# Patient Record
Sex: Male | Born: 1950 | Race: White | Hispanic: No | Marital: Married | Smoking: Former smoker
Health system: Southern US, Community
[De-identification: ages and names within clinical notes are randomized; demographics above are authoritative.]

## PROBLEM LIST (undated history)

## (undated) ENCOUNTER — Emergency Department (HOSPITAL_COMMUNITY): Admission: EM | Payer: Medicare Other | Source: Home / Self Care

## (undated) DIAGNOSIS — M5430 Sciatica, unspecified side: Secondary | ICD-10-CM

## (undated) DIAGNOSIS — I10 Essential (primary) hypertension: Secondary | ICD-10-CM

## (undated) DIAGNOSIS — I4891 Unspecified atrial fibrillation: Secondary | ICD-10-CM

## (undated) DIAGNOSIS — R55 Syncope and collapse: Secondary | ICD-10-CM

## (undated) DIAGNOSIS — I517 Cardiomegaly: Secondary | ICD-10-CM

## (undated) DIAGNOSIS — F1010 Alcohol abuse, uncomplicated: Secondary | ICD-10-CM

## (undated) DIAGNOSIS — I2510 Atherosclerotic heart disease of native coronary artery without angina pectoris: Secondary | ICD-10-CM

## (undated) DIAGNOSIS — I428 Other cardiomyopathies: Secondary | ICD-10-CM

## (undated) DIAGNOSIS — I340 Nonrheumatic mitral (valve) insufficiency: Secondary | ICD-10-CM

## (undated) DIAGNOSIS — I482 Chronic atrial fibrillation, unspecified: Secondary | ICD-10-CM

## (undated) DIAGNOSIS — Z7901 Long term (current) use of anticoagulants: Secondary | ICD-10-CM

## (undated) DIAGNOSIS — J189 Pneumonia, unspecified organism: Secondary | ICD-10-CM

## (undated) DIAGNOSIS — I420 Dilated cardiomyopathy: Secondary | ICD-10-CM

## (undated) DIAGNOSIS — I219 Acute myocardial infarction, unspecified: Secondary | ICD-10-CM

## (undated) DIAGNOSIS — I509 Heart failure, unspecified: Secondary | ICD-10-CM

## (undated) HISTORY — DX: Syncope and collapse: R55

## (undated) HISTORY — DX: Cardiomegaly: I51.7

## (undated) HISTORY — PX: STOMACH SURGERY: SHX791

## (undated) HISTORY — DX: Alcohol abuse, uncomplicated: F10.10

## (undated) HISTORY — DX: Other cardiomyopathies: I42.8

## (undated) HISTORY — DX: Long term (current) use of anticoagulants: Z79.01

## (undated) HISTORY — DX: Unspecified atrial fibrillation: I48.91

## (undated) HISTORY — DX: Chronic atrial fibrillation, unspecified: I48.20

## (undated) HISTORY — DX: Heart failure, unspecified: I50.9

## (undated) HISTORY — DX: Atherosclerotic heart disease of native coronary artery without angina pectoris: I25.10

## (undated) HISTORY — DX: Dilated cardiomyopathy: I42.0

## (undated) HISTORY — DX: Nonrheumatic mitral (valve) insufficiency: I34.0

## (undated) HISTORY — DX: Sciatica, unspecified side: M54.30

---

## 1997-08-29 ENCOUNTER — Inpatient Hospital Stay (HOSPITAL_COMMUNITY): Admission: AD | Admit: 1997-08-29 | Discharge: 1997-09-03 | Payer: Self-pay | Admitting: Internal Medicine

## 2003-02-18 DIAGNOSIS — R55 Syncope and collapse: Secondary | ICD-10-CM

## 2003-02-18 HISTORY — DX: Syncope and collapse: R55

## 2003-02-26 ENCOUNTER — Inpatient Hospital Stay (HOSPITAL_COMMUNITY): Admission: EM | Admit: 2003-02-26 | Discharge: 2003-03-03 | Payer: Self-pay | Admitting: Emergency Medicine

## 2003-02-27 ENCOUNTER — Encounter: Payer: Self-pay | Admitting: Cardiology

## 2003-02-28 HISTORY — PX: CARDIAC CATHETERIZATION: SHX172

## 2004-01-04 ENCOUNTER — Ambulatory Visit: Payer: Self-pay

## 2004-01-25 ENCOUNTER — Ambulatory Visit: Payer: Self-pay | Admitting: Cardiovascular Disease

## 2004-02-08 ENCOUNTER — Ambulatory Visit: Payer: Self-pay | Admitting: Cardiology

## 2004-02-22 ENCOUNTER — Ambulatory Visit: Payer: Self-pay | Admitting: Internal Medicine

## 2004-03-07 ENCOUNTER — Ambulatory Visit: Payer: Self-pay | Admitting: Cardiology

## 2004-04-04 ENCOUNTER — Ambulatory Visit: Payer: Self-pay | Admitting: *Deleted

## 2004-04-18 ENCOUNTER — Ambulatory Visit: Payer: Self-pay | Admitting: Internal Medicine

## 2004-05-16 ENCOUNTER — Ambulatory Visit: Payer: Self-pay | Admitting: Internal Medicine

## 2004-05-17 ENCOUNTER — Ambulatory Visit: Payer: Self-pay | Admitting: Cardiovascular Disease

## 2004-06-13 ENCOUNTER — Ambulatory Visit: Payer: Self-pay | Admitting: Cardiology

## 2004-07-11 ENCOUNTER — Ambulatory Visit: Payer: Self-pay | Admitting: Cardiology

## 2004-08-01 ENCOUNTER — Ambulatory Visit: Payer: Self-pay | Admitting: Internal Medicine

## 2004-08-29 ENCOUNTER — Ambulatory Visit: Payer: Self-pay | Admitting: Internal Medicine

## 2004-09-26 ENCOUNTER — Ambulatory Visit: Payer: Self-pay | Admitting: Cardiology

## 2004-10-24 ENCOUNTER — Ambulatory Visit: Payer: Self-pay | Admitting: Cardiology

## 2004-11-21 ENCOUNTER — Ambulatory Visit: Payer: Self-pay | Admitting: Cardiology

## 2004-12-12 ENCOUNTER — Ambulatory Visit: Payer: Self-pay | Admitting: Internal Medicine

## 2005-01-03 ENCOUNTER — Ambulatory Visit: Payer: Self-pay | Admitting: Cardiology

## 2005-02-06 ENCOUNTER — Ambulatory Visit: Payer: Self-pay | Admitting: Cardiology

## 2005-03-06 ENCOUNTER — Ambulatory Visit: Payer: Self-pay | Admitting: Cardiology

## 2005-03-27 ENCOUNTER — Ambulatory Visit: Payer: Self-pay | Admitting: Cardiology

## 2005-04-24 ENCOUNTER — Ambulatory Visit: Payer: Self-pay | Admitting: Cardiology

## 2005-05-22 ENCOUNTER — Ambulatory Visit: Payer: Self-pay | Admitting: Cardiology

## 2005-06-05 ENCOUNTER — Ambulatory Visit: Payer: Self-pay | Admitting: Cardiology

## 2005-06-26 ENCOUNTER — Ambulatory Visit: Payer: Self-pay | Admitting: Cardiology

## 2005-07-24 ENCOUNTER — Ambulatory Visit: Payer: Self-pay | Admitting: Cardiology

## 2005-08-29 ENCOUNTER — Ambulatory Visit: Payer: Self-pay | Admitting: Internal Medicine

## 2005-09-26 ENCOUNTER — Ambulatory Visit: Payer: Self-pay | Admitting: Internal Medicine

## 2005-10-24 ENCOUNTER — Ambulatory Visit: Payer: Self-pay | Admitting: Cardiology

## 2005-11-21 ENCOUNTER — Ambulatory Visit: Payer: Self-pay | Admitting: Cardiology

## 2005-12-18 ENCOUNTER — Ambulatory Visit: Payer: Self-pay | Admitting: Cardiology

## 2005-12-25 ENCOUNTER — Ambulatory Visit: Payer: Self-pay | Admitting: Cardiology

## 2006-01-12 ENCOUNTER — Ambulatory Visit: Payer: Self-pay | Admitting: Cardiology

## 2006-01-12 ENCOUNTER — Ambulatory Visit: Payer: Self-pay | Admitting: Cardiovascular Disease

## 2006-01-19 ENCOUNTER — Ambulatory Visit: Payer: Self-pay

## 2006-01-19 ENCOUNTER — Encounter: Payer: Self-pay | Admitting: Cardiology

## 2006-02-02 ENCOUNTER — Ambulatory Visit: Payer: Self-pay | Admitting: Cardiology

## 2006-03-02 ENCOUNTER — Ambulatory Visit: Payer: Self-pay | Admitting: Internal Medicine

## 2006-03-23 ENCOUNTER — Ambulatory Visit: Payer: Self-pay | Admitting: Cardiology

## 2006-04-13 ENCOUNTER — Ambulatory Visit: Payer: Self-pay | Admitting: Cardiovascular Disease

## 2006-05-11 ENCOUNTER — Ambulatory Visit: Payer: Self-pay | Admitting: Internal Medicine

## 2006-05-25 ENCOUNTER — Ambulatory Visit: Payer: Self-pay | Admitting: Cardiovascular Disease

## 2006-06-22 ENCOUNTER — Ambulatory Visit: Payer: Self-pay | Admitting: Cardiology

## 2006-07-09 ENCOUNTER — Ambulatory Visit: Payer: Self-pay | Admitting: Internal Medicine

## 2006-08-06 ENCOUNTER — Ambulatory Visit: Payer: Self-pay | Admitting: Cardiology

## 2006-09-03 ENCOUNTER — Ambulatory Visit: Payer: Self-pay | Admitting: Cardiology

## 2006-09-24 ENCOUNTER — Ambulatory Visit: Payer: Self-pay | Admitting: Internal Medicine

## 2006-10-22 ENCOUNTER — Ambulatory Visit: Payer: Self-pay | Admitting: Cardiology

## 2006-11-19 ENCOUNTER — Ambulatory Visit: Payer: Self-pay | Admitting: Cardiology

## 2006-12-11 ENCOUNTER — Ambulatory Visit: Payer: Self-pay | Admitting: Cardiology

## 2007-01-08 ENCOUNTER — Ambulatory Visit: Payer: Self-pay | Admitting: Internal Medicine

## 2007-02-05 ENCOUNTER — Ambulatory Visit: Payer: Self-pay | Admitting: Internal Medicine

## 2007-03-01 ENCOUNTER — Ambulatory Visit: Payer: Self-pay | Admitting: Cardiovascular Disease

## 2007-03-29 ENCOUNTER — Ambulatory Visit: Payer: Self-pay | Admitting: Cardiology

## 2007-04-12 ENCOUNTER — Ambulatory Visit: Payer: Self-pay | Admitting: Internal Medicine

## 2007-05-10 ENCOUNTER — Ambulatory Visit: Payer: Self-pay | Admitting: Cardiovascular Disease

## 2007-05-24 ENCOUNTER — Ambulatory Visit: Payer: Self-pay | Admitting: Internal Medicine

## 2007-06-14 ENCOUNTER — Ambulatory Visit: Payer: Self-pay | Admitting: Cardiology

## 2007-07-09 ENCOUNTER — Ambulatory Visit: Payer: Self-pay | Admitting: Internal Medicine

## 2007-07-30 ENCOUNTER — Ambulatory Visit: Payer: Self-pay | Admitting: Cardiovascular Disease

## 2007-08-27 ENCOUNTER — Ambulatory Visit: Payer: Self-pay | Admitting: Cardiology

## 2007-09-24 ENCOUNTER — Ambulatory Visit: Payer: Self-pay | Admitting: Cardiology

## 2007-11-03 ENCOUNTER — Ambulatory Visit: Payer: Self-pay | Admitting: Cardiovascular Disease

## 2007-12-01 ENCOUNTER — Ambulatory Visit: Payer: Self-pay | Admitting: Internal Medicine

## 2007-12-29 ENCOUNTER — Ambulatory Visit: Payer: Self-pay | Admitting: Internal Medicine

## 2008-02-01 ENCOUNTER — Ambulatory Visit: Payer: Self-pay | Admitting: Internal Medicine

## 2008-02-22 ENCOUNTER — Ambulatory Visit: Payer: Self-pay | Admitting: Cardiology

## 2008-04-27 ENCOUNTER — Ambulatory Visit: Payer: Self-pay | Admitting: Cardiovascular Disease

## 2008-04-27 ENCOUNTER — Encounter: Payer: Self-pay | Admitting: Cardiovascular Disease

## 2008-04-27 DIAGNOSIS — I420 Dilated cardiomyopathy: Secondary | ICD-10-CM | POA: Insufficient documentation

## 2008-04-27 DIAGNOSIS — M5430 Sciatica, unspecified side: Secondary | ICD-10-CM | POA: Insufficient documentation

## 2008-04-27 DIAGNOSIS — I4891 Unspecified atrial fibrillation: Secondary | ICD-10-CM | POA: Insufficient documentation

## 2008-05-25 ENCOUNTER — Ambulatory Visit: Payer: Self-pay | Admitting: Cardiology

## 2008-06-22 ENCOUNTER — Ambulatory Visit: Payer: Self-pay | Admitting: Cardiology

## 2008-07-13 ENCOUNTER — Ambulatory Visit: Payer: Self-pay | Admitting: Internal Medicine

## 2008-07-13 LAB — CONVERTED CEMR LAB
POC INR: 2.9
Protime: 20.6

## 2008-07-18 ENCOUNTER — Encounter: Payer: Self-pay | Admitting: *Deleted

## 2008-08-23 ENCOUNTER — Encounter: Payer: Self-pay | Admitting: *Deleted

## 2008-09-04 ENCOUNTER — Ambulatory Visit: Payer: Self-pay | Admitting: Cardiology

## 2008-09-04 LAB — CONVERTED CEMR LAB: POC INR: 3.6

## 2008-09-25 ENCOUNTER — Ambulatory Visit: Payer: Self-pay | Admitting: Internal Medicine

## 2008-09-25 LAB — CONVERTED CEMR LAB
POC INR: 2.4
Prothrombin Time: 19 s

## 2008-10-16 ENCOUNTER — Encounter (INDEPENDENT_AMBULATORY_CARE_PROVIDER_SITE_OTHER): Payer: Self-pay | Admitting: *Deleted

## 2008-10-30 ENCOUNTER — Ambulatory Visit: Payer: Self-pay | Admitting: Internal Medicine

## 2008-10-30 LAB — CONVERTED CEMR LAB: POC INR: 1.9

## 2008-11-30 DIAGNOSIS — I509 Heart failure, unspecified: Secondary | ICD-10-CM | POA: Insufficient documentation

## 2008-12-04 ENCOUNTER — Ambulatory Visit: Payer: Self-pay | Admitting: Cardiovascular Disease

## 2008-12-04 LAB — CONVERTED CEMR LAB: POC INR: 2.5

## 2008-12-19 ENCOUNTER — Ambulatory Visit: Payer: Self-pay

## 2008-12-19 ENCOUNTER — Ambulatory Visit: Payer: Self-pay | Admitting: Cardiology

## 2008-12-19 ENCOUNTER — Ambulatory Visit (HOSPITAL_COMMUNITY): Admission: RE | Admit: 2008-12-19 | Discharge: 2008-12-19 | Payer: Self-pay | Admitting: Cardiovascular Disease

## 2008-12-19 ENCOUNTER — Encounter: Payer: Self-pay | Admitting: Cardiovascular Disease

## 2009-01-01 ENCOUNTER — Ambulatory Visit: Payer: Self-pay | Admitting: Cardiology

## 2009-01-01 LAB — CONVERTED CEMR LAB: POC INR: 1.7

## 2009-01-17 ENCOUNTER — Ambulatory Visit: Payer: Self-pay | Admitting: Cardiology

## 2009-01-17 LAB — CONVERTED CEMR LAB: POC INR: 1.9

## 2009-04-09 ENCOUNTER — Ambulatory Visit: Payer: Self-pay | Admitting: Cardiovascular Disease

## 2009-04-09 LAB — CONVERTED CEMR LAB: POC INR: 2.5

## 2009-04-19 ENCOUNTER — Encounter (INDEPENDENT_AMBULATORY_CARE_PROVIDER_SITE_OTHER): Payer: Self-pay | Admitting: *Deleted

## 2009-05-07 ENCOUNTER — Ambulatory Visit: Payer: Self-pay | Admitting: Internal Medicine

## 2009-05-07 LAB — CONVERTED CEMR LAB: POC INR: 2.5

## 2009-06-04 ENCOUNTER — Ambulatory Visit: Payer: Self-pay | Admitting: Cardiology

## 2009-06-04 LAB — CONVERTED CEMR LAB: POC INR: 1.3

## 2009-07-09 ENCOUNTER — Encounter (INDEPENDENT_AMBULATORY_CARE_PROVIDER_SITE_OTHER): Payer: Self-pay | Admitting: Pharmacist

## 2009-07-31 ENCOUNTER — Ambulatory Visit: Payer: Self-pay | Admitting: Internal Medicine

## 2009-07-31 LAB — CONVERTED CEMR LAB: POC INR: 2.7

## 2009-09-07 ENCOUNTER — Encounter (INDEPENDENT_AMBULATORY_CARE_PROVIDER_SITE_OTHER): Payer: Self-pay | Admitting: Pharmacist

## 2009-09-07 ENCOUNTER — Telehealth (INDEPENDENT_AMBULATORY_CARE_PROVIDER_SITE_OTHER): Payer: Self-pay

## 2009-09-27 ENCOUNTER — Ambulatory Visit: Payer: Self-pay | Admitting: Internal Medicine

## 2009-10-25 ENCOUNTER — Ambulatory Visit: Payer: Self-pay | Admitting: Cardiology

## 2009-12-14 ENCOUNTER — Ambulatory Visit: Payer: Self-pay | Admitting: Cardiovascular Disease

## 2009-12-14 ENCOUNTER — Encounter: Payer: Self-pay | Admitting: Physician Assistant

## 2009-12-14 LAB — CONVERTED CEMR LAB: POC INR: 3.2

## 2010-03-13 ENCOUNTER — Ambulatory Visit: Admission: RE | Admit: 2010-03-13 | Discharge: 2010-03-13 | Payer: Self-pay | Source: Home / Self Care

## 2010-03-19 NOTE — Progress Notes (Signed)
Summary: Overdue for Coumadin follow-up  Phone Note Outgoing Call   Call placed by: Cloyde Reams RN,  September 07, 2009 12:30 PM Call placed to: Patient Summary of Call: Attempted to call pt, overdue for Coumadin follow-up.  LMOM to call back to r/s missed Coumadin appt.  Will send delinquent letter as well. Initial call taken by: Cloyde Reams RN,  September 07, 2009 12:31 PM

## 2010-03-19 NOTE — Letter (Signed)
Summary: [facility] - Delinquent Coumadin 1  Coumadin  [address]   [place], [place] [address]   Phone: [phone]  Fax: [phone]     Jul 09, 2009 MRN: [id number]   KOTA CIANCIO [address] [place], [place]  [address]   Dear Mr. MOLLENKOPF,  This letter is being sent to you as a reminder that it is necessary for you to get your INR/PT checked regularly so that we can optimize your care.  Our records indicate that you were scheduled to have a test done recently.  As of today, we have not received the results of this test.  It is very important that you have your INR checked.  Please call our office at the number listed above to schedule an appointment at your earliest convenience.    If you have recently had your protime checked or have discontinued this medication, please contact our office at the above phone number to clarify this issue.  Thank you for this prompt attention to this important health care matter.  Sincerely,   [facility] Team

## 2010-03-19 NOTE — Medication Information (Signed)
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 no    Recent/future dental: no  Any missed doses?: no       Is patient compliant with meds? yes       Allergies: No Known Drug Allergies  Anticoagulation Management History:      The patient is taking warfarin and comes in today for a routine follow up visit.  Negative risk factors for bleeding include an age less than 68 years old.  The bleeding index is 'low risk'.  Positive CHADS2 values include History of CHF.  Negative CHADS2 values include Age > 45 years old.  The start date was 09/06/1997.  Anticoagulation responsible provider: Bensimhon MD, Reuel Boom.  INR POC: 2.7.  Cuvette Lot#: [id number].  Exp: 09/2010.    Anticoagulation Management Assessment/Plan:      The patient's current anticoagulation dose is Coumadin 5 mg tabs: Take as directed by coumadin clinic..  The target INR is 2 - 3.  The next INR is due 08/28/2009.  Anticoagulation instructions were given to patient.  Results were reviewed/authorized by Bethena Midget, RN, BSN.  He was notified by Bethena Midget, RN, BSN.         Prior Anticoagulation Instructions: INR 1.3  Take 1 1/2 tablets today and tomorrow then resume same dose of 1 tablet every day except 1/2 tablet on Monday and Friday   Current Anticoagulation Instructions: INR 2.7 Continue 1 pill everyday except 1/2 pill on Mondays and Fridays. Recheck in 4 weeks.

## 2010-03-19 NOTE — Letter (Signed)
Summary: Appointment - Reminder 2  [facility], Main Office  [address]   [place], [place] [address]   Phone: [phone]  Fax: [phone]     April 19, 2009 MRN: [id number]   Timothy Cantu [address] [place], [place]  [address]   Dear Timothy Cantu,  Our records indicate that it is time to schedule a follow-up appointment with Dr. Eden Emms. It is very important that we reach you to schedule this appointment. We look forward to participating in your health care needs. Please contact us at the number listed above at your earliest convenience to schedule your appointment.  If you are unable to make an appointment at this time, give [place] a call so we can update our records.     Sincerely,   Migdalia Dk [facility] Scheduling Team

## 2010-03-19 NOTE — Medication Information (Signed)
Summary: CVRR      Allergies Added: NKDA Anticoagulant Therapy  Managed by: Reina Fuse, PharmD Referring MD: [name] MD Supervising MD: Gala Romney MD, Reuel Boom Indication 1: paroxysmal ventricular tach (ICD-427.1) Indication 2: Atrial Fibrillation (ICD-427.31) Lab Used: [facility] Site: [facility] INR POC 2.9 INR RANGE 2 - 3  Dietary changes: no    Health status changes: no    Bleeding/hemorrhagic complications: no    Recent/future hospitalizations: no    Any changes in medication regimen? no    Recent/future dental: no  Any missed doses?: no       Is patient compliant with meds? yes       Current Medications (verified): 1)  Coreg 25 Mg Tabs (Carvedilol) .Marland Kitchen.. 1 Tab Two Times A Day 2)  Captopril 25 Mg Tabs (Captopril) .Marland Kitchen.. 1 Tab Qid 3)  Coumadin 5 Mg Tabs (Warfarin Sodium) .... Take As Directed By Coumadin Clinic.  Allergies (verified): No Known Drug Allergies  Anticoagulation Management History:      The patient is taking warfarin and comes in today for a routine follow up visit.  Negative risk factors for bleeding include an age less than 22 years old.  The bleeding index is 'low risk'.  Positive CHADS2 values include History of CHF.  Negative CHADS2 values include Age > 76 years old.  The start date was 09/06/1997.  Anticoagulation responsible Shandreka Dante: Bensimhon MD, Reuel Boom.  INR POC: 2.9.  Cuvette Lot#: [id number].  Exp: 09/2010.    Anticoagulation Management Assessment/Plan:      The patient's current anticoagulation dose is Coumadin 5 mg tabs: Take as directed by coumadin clinic..  The target INR is 2 - 3.  The next INR is due 10/25/2009.  Anticoagulation instructions were given to patient.  Results were reviewed/authorized by Reina Fuse, PharmD.  He was notified by Reina Fuse PharmD.         Prior Anticoagulation Instructions: INR 2.7 Continue 1 pill everyday except 1/2 pill on Mondays and Fridays. Recheck in 4 weeks.   Current Anticoagulation  Instructions: INR 2.9  Continue taking Coumadin 1 tab (5 mg) on Sun, Tues, Wed, Thur, Sat and Coumadin 0.5 tab (2.5 mg) on Mon and Fri. Return to clinic in 4 weeks.

## 2010-03-19 NOTE — Medication Information (Signed)
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 no    Recent/future dental: no  Any missed doses?: no       Is patient compliant with meds? yes       Allergies: No Known Drug Allergies  Anticoagulation Management History:      The patient is taking warfarin and comes in today for a routine follow up visit.  Negative risk factors for bleeding include an age less than 33 years old.  The bleeding index is 'low risk'.  Positive CHADS2 values include History of CHF.  Negative CHADS2 values include Age > 31 years old.  The start date was 09/06/1997.  Anticoagulation responsible provider: Eden Emms MD, Theron Arista.  INR POC: 2.5.  Cuvette Lot#: [id number].  Exp: 05/2010.    Anticoagulation Management Assessment/Plan:      The patient's current anticoagulation dose is Coumadin 5 mg tabs: Take as directed by coumadin clinic..  The target INR is 2 - 3.  The next INR is due 05/07/2009.  Anticoagulation instructions were given to patient.  Results were reviewed/authorized by Bethena Midget, RN, BSN.  He was notified by Bethena Midget, RN, BSN.         Prior Anticoagulation Instructions: INR 1.9  Start taking 1 tablet daily except 1/2 tablet on Mondays and Fridays.  Recheck in 3 weeks.    Current Anticoagulation Instructions: INR 2.5 Continue 1 pill everyday except 1/2 pill on Mondays and Fridays. Recheck in 4 weeks.

## 2010-03-19 NOTE — Medication Information (Signed)
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 no    Recent/future dental: no  Any missed doses?: no       Is patient compliant with meds? yes       Allergies: No Known Drug Allergies  Anticoagulation Management History:      The patient is taking warfarin and comes in today for a routine follow up visit.  Negative risk factors for bleeding include an age less than 63 years old.  The bleeding index is 'low risk'.  Positive CHADS2 values include History of CHF.  Negative CHADS2 values include Age > 67 years old.  The start date was 09/06/1997.  Anticoagulation responsible provider: Excell Seltzer MD, Casimiro Needle.  INR POC: 3.2.  Cuvette Lot#: [id number].  Exp: 11/2010.    Anticoagulation Management Assessment/Plan:      The patient's current anticoagulation dose is Coumadin 5 mg tabs: Take as directed by coumadin clinic..  The target INR is 2 - 3.  The next INR is due 01/04/2010.  Anticoagulation instructions were given to patient.  Results were reviewed/authorized by Reina Fuse, PharmD.  He was notified by Reina Fuse, PharmD.         Prior Anticoagulation Instructions: INR 2.7  Continue taking 1 tablet everyday except take 1/2 tablet on Mondays and Fridays. Re-check INR in 4 weeks.   Current Anticoagulation Instructions: INR 3.2  Tomorrow, Saturday, October 29th, do not take Coumadin. Then, continue taking Coumadin 1 tab (5 mg) on all days except Coumadin 0.5 tab (2.5 mg) on Mondays and Fridays. Return to clinic in 3 weeks.

## 2010-03-19 NOTE — Assessment & Plan Note (Signed)
Summary: rov./ gd  Medications Added CAPTOPRIL 25 MG TABS (CAPTOPRIL) 1 tab three times a day        CC:  check up....pt didnot bring med list he states theres no change.  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  It was likely from ETOH.  His EF had returned to normal with abstinance.  He has chronic afib and is on coumadin.  He has not had any bleeding problems.  He continues to work at [employer].  He had a cath in 2005 after syncope and has no CAD.  He has not had recurrent syncope since he stopped drinking.  He had a follow up echo last year that demonstrated ejection fraction of 50% to 55%. Wall  motion was normal; there were no regional wall motion  abnormalities, mild MR, severely dilated LA, mildly to moderately dilated RA.  Today, the patient denies chest pain, dyspnea, orthopnea, PND, pedal edema or syncope.  The patient describes NYHA Class 1 symptoms.   Current Medications (verified): 1)  Coreg 25 Mg Tabs (Carvedilol) .Marland Kitchen.. 1 Tab Two Times A Day 2)  Captopril 25 Mg Tabs (Captopril) .Marland Kitchen.. 1 Tab Qid 3)  Coumadin 5 Mg Tabs (Warfarin Sodium) .... Take As Directed By Coumadin Clinic.  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 Married for over 35 years Originally from [place] English is poor Two sons Wife also works at [employer] Previous Drinker Non smoker  Vital Signs:  Patient profile:   60 year old male Height:      75 inches Weight:      254 pounds BMI:     31.86 Pulse rate:   85 / minute Resp:     12 per minute BP sitting:   129 / 85  (left arm)  Vitals Entered By: Kem Parkinson (December 14, 2009 11:40 AM)  Physical Exam  General:   Well nourished, well developed, in no acute distress HEENT: normal Neck: no JVD Cardiac:  normal S1, S2; irregularly irregular rhythm; no murmur Lungs:  clear to auscultation bilaterally, no wheezing, rhonchi or rales Abd: soft, nontender, no hepatomegaly Ext: no clubbing, cyanosis or edema Vascular: no carotid  bruits Skin: warm and dry    EKG  Procedure date:  12/14/2009  Findings:      Atrial fibrillation with a controlled ventricular response rate of: 85; NSSTTW changes.  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  No further alcohol abuse. Continue current medications.  His updated medication list for this problem includes:    Coreg 25 Mg Tabs (Carvedilol) .Marland Kitchen... 1 tab two times a day    Captopril 25 Mg Tabs (Captopril) .Marland Kitchen... 1 tab three times a day    Coumadin 5 Mg Tabs (Warfarin sodium) .Marland Kitchen... Take as directed by coumadin clinic.  Problem # 2:  ATRIAL FIBRILLATION (ICD-427.31) Rate controlled.  No changes.  His updated medication list for this problem includes:    Coreg 25 Mg Tabs (Carvedilol) .Marland Kitchen... 1 tab two times a day    Coumadin 5 Mg Tabs (Warfarin sodium) .Marland Kitchen... Take as directed by coumadin clinic.  Problem # 3:  COUMADIN THERAPY (ICD-V58.61) INR up today.  One dose held and follow up  with coumadin clinic.  CHF Assessment/Plan:      The patient's current weight is 254 pounds.  His previous weight was 254 pounds.     Patient Instructions: 1)  Your physician recommends that you schedule a follow-up appointment in: 6 MONTHS Prescriptions: COUMADIN 5 MG TABS (WARFARIN SODIUM) Take as directed by coumadin clinic.  #90 Each x 4   Entered by:   Deliah Goody, RN   Authorized by:   Colon Branch, MD, [facility]   Signed by:   Deliah Goody, RN on 12/14/2009   Method used:   Electronically to        [facility] [address] (retail)       [phone]       [place], [place]  [address]       Ph: [phone]       Fax: [phone]   RxID:   [id number] CAPTOPRIL 25 MG TABS (CAPTOPRIL) 1 tab three times a day  #270 x 4   Entered by:   Deliah Goody, RN   Authorized by:   Colon Branch, MD, [facility]   Signed by:   Deliah Goody, RN on 12/14/2009   Method used:   Electronically to        [facility] [address] (retail)       [phone]       [place], [place]  [address]       Ph: [phone]       Fax: [phone]   RxID:   [id number] COREG 25 MG TABS (CARVEDILOL) 1 tab two times a day  #180 x 4   Entered by:   Deliah Goody, RN   Authorized by:   Colon Branch, MD, [facility]   Signed by:   Deliah Goody, RN on 12/14/2009   Method used:   Electronically to        [facility] [address] (retail)       [phone]       [place], [place]  [address]       Ph: [phone]       Fax: [phone]   RxID:   [id number]

## 2010-03-19 NOTE — Medication Information (Signed)
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 no    Recent/future dental: no  Any missed doses?: no       Is patient compliant with meds? yes      Comments: due to language barrier, hard to get a good understanding of diet changes   Allergies: No Known Drug Allergies  Anticoagulation Management History:      The patient is taking warfarin and comes in today for a routine follow up visit.  Negative risk factors for bleeding include an age less than 60 years old.  The bleeding index is 'low risk'.  Positive CHADS2 values include History of CHF.  Negative CHADS2 values include Age > 60 years old.  The start date was 09/06/1997.  Anticoagulation responsible provider: Shirlee Latch MD, Juliyah Mergen.  INR POC: 1.3.  Cuvette Lot#: [id number].  Exp: 06/2010.    Anticoagulation Management Assessment/Plan:      The patient's current anticoagulation dose is Coumadin 5 mg tabs: Take as directed by coumadin clinic..  The target INR is 2 - 3.  The next INR is due 06/15/2009.  Anticoagulation instructions were given to patient.  Results were reviewed/authorized by Weston Brass, PharmD.  He was notified by Weston Brass PharmD.         Prior Anticoagulation Instructions: INR 2.5  Continue same dose of 1 tablet every day except 1/2 tablet on Monday and Friday.    Current Anticoagulation Instructions: INR 1.3  Take 1 1/2 tablets today and tomorrow then resume same dose of 1 tablet every day except 1/2 tablet on Monday and Friday

## 2010-03-19 NOTE — Medication Information (Signed)
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 no    Recent/future dental: no  Any missed doses?: no       Is patient compliant with meds? yes       Allergies: No Known Drug Allergies  Anticoagulation Management History:      The patient is taking warfarin and comes in today for a routine follow up visit.  Negative risk factors for bleeding include an age less than 66 years old.  The bleeding index is 'low risk'.  Positive CHADS2 values include History of CHF.  Negative CHADS2 values include Age > 62 years old.  The start date was 09/06/1997.  Anticoagulation responsible Jamaine Quintin: Jens Som MD, Arlys John.  Cuvette Lot#: [id number].  Exp: 11/2010.    Anticoagulation Management Assessment/Plan:      The patient's current anticoagulation dose is Coumadin 5 mg tabs: Take as directed by coumadin clinic..  The target INR is 2 - 3.  The next INR is due 11/22/2009.  Anticoagulation instructions were given to patient.  Results were reviewed/authorized by Eda Keys, PharmD.  He was notified by Harrel Carina, PharmD candidate.         Prior Anticoagulation Instructions: INR 2.9  Continue taking Coumadin 1 tab (5 mg) on Sun, Tues, Wed, Thur, Sat and Coumadin 0.5 tab (2.5 mg) on Mon and Fri. Return to clinic in 4 weeks.   Current Anticoagulation Instructions: INR 2.7  Continue taking 1 tablet everyday except take 1/2 tablet on Mondays and Fridays. Re-check INR in 4 weeks.

## 2010-03-19 NOTE — Letter (Signed)
Summary: [facility] - Delinquent Coumadin 1  [facility] at [address]   [place], [place] [address]   Phone: [phone]  Fax: [phone]     September 07, 2009 MRN: [id number]   Timothy Cantu [address] [place], [place]  [address]   Dear Timothy Cantu,  This letter is being sent to you as a reminder that it is necessary for you to get your INR/PT checked regularly so that we can optimize your care.  Our records indicate that you were scheduled to have a test done recently.  As of today, we have not received the results of this test.  It is very important that you have your INR checked.  Please call our office at the number listed above to schedule an appointment at your earliest convenience.    If you have recently had your protime checked or have discontinued this medication, please contact our office at the above phone number to clarify this issue.  Thank you for this prompt attention to this important health care matter.  Sincerely,   [facility] Cardiovascular Risk Reduction [facility] Team

## 2010-03-19 NOTE — Medication Information (Signed)
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 no    Recent/future dental: no  Any missed doses?: no       Is patient compliant with meds? yes       Allergies: No Known Drug Allergies  Anticoagulation Management History:      The patient is taking warfarin and comes in today for a routine follow up visit.  Negative risk factors for bleeding include an age less than 53 years old.  The bleeding index is 'low risk'.  Positive CHADS2 values include History of CHF.  Negative CHADS2 values include Age > 48 years old.  The start date was 09/06/1997.  Anticoagulation responsible provider: Tenny Craw MD, Gunnar Fusi.  INR POC: 2.5.  Cuvette Lot#: [id number].  Exp: 06/2010.    Anticoagulation Management Assessment/Plan:      The patient's current anticoagulation dose is Coumadin 5 mg tabs: Take as directed by coumadin clinic..  The target INR is 2 - 3.  The next INR is due 06/04/2009.  Anticoagulation instructions were given to patient.  Results were reviewed/authorized by Weston Brass, PharmD.  He was notified by Weston Brass PharmD.         Prior Anticoagulation Instructions: INR 2.5 Continue 1 pill everyday except 1/2 pill on Mondays and Fridays. Recheck in 4 weeks.   Current Anticoagulation Instructions: INR 2.5  Continue same dose of 1 tablet every day except 1/2 tablet on Monday and Friday.

## 2010-03-21 NOTE — Medication Information (Signed)
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 no    Recent/future dental: no  Any missed doses?: no       Is patient compliant with meds? yes       Allergies: No Known Drug Allergies  Anticoagulation Management History:      The patient is taking warfarin and comes in today for a routine follow up visit.  Negative risk factors for bleeding include an age less than 60 years old.  The bleeding index is 'low risk'.  Positive CHADS2 values include History of CHF.  Negative CHADS2 values include Age > 60 years old.  The start date was 09/06/1997.  Anticoagulation responsible provider: Patty Sermons, MD.  INR POC: 3.1.  Cuvette Lot#: [id number].  Exp: 11/2010.    Anticoagulation Management Assessment/Plan:      The patient's current anticoagulation dose is Coumadin 5 mg tabs: Take as directed by coumadin clinic..  The target INR is 2 - 3.  The next INR is due 04/03/2010.  Anticoagulation instructions were given to patient.  Results were reviewed/authorized by Weston Brass, PharmD.  He was notified by Linward Headland, PharmD candidate.         Prior Anticoagulation Instructions: INR 3.2  Tomorrow, Saturday, October 29th, do not take Coumadin. Then, continue taking Coumadin 1 tab (5 mg) on all days except Coumadin 0.5 tab (2.5 mg) on Mondays and Fridays. Return to clinic in 3 weeks.   Current Anticoagulation Instructions: INR 3.1 (INR goal: 2-3)  Take 1/2 tablet tomorrow (Thursday).  Resume normal schedule on Friday of 1 tablet everyday except Mondays and Fridays.   Recheck in 3 weeks.

## 2010-04-25 ENCOUNTER — Encounter: Payer: Self-pay | Admitting: Cardiovascular Disease

## 2010-04-25 DIAGNOSIS — I4891 Unspecified atrial fibrillation: Secondary | ICD-10-CM

## 2010-05-13 ENCOUNTER — Telehealth: Payer: Self-pay | Admitting: Cardiovascular Disease

## 2010-05-13 NOTE — Telephone Encounter (Signed)
Pt calling re surgical clearance for dental work, said dentist called last week

## 2010-05-13 NOTE — Telephone Encounter (Signed)
Spoke with pt, he is needing to have two teeth pulled and they want his INR down around 2.0. Pt wants to know if okay to hold his coumadin for the procedure. He is calling to get an appt with coumadin clinic this week. Will forward for dr Eden Emms review.

## 2010-05-13 NOTE — Telephone Encounter (Signed)
Ok to hold coumadin and ok to have teeth pulled

## 2010-05-14 NOTE — Telephone Encounter (Signed)
Pt aware, pt to call back if needs a letter sent to dentist. Pt cant remember his name.

## 2010-05-16 ENCOUNTER — Ambulatory Visit (INDEPENDENT_AMBULATORY_CARE_PROVIDER_SITE_OTHER): Payer: BC Managed Care – PPO | Admitting: *Deleted

## 2010-05-16 DIAGNOSIS — Z7901 Long term (current) use of anticoagulants: Secondary | ICD-10-CM

## 2010-05-16 DIAGNOSIS — I4891 Unspecified atrial fibrillation: Secondary | ICD-10-CM

## 2010-05-16 LAB — POCT INR: INR: 1.8

## 2010-05-16 NOTE — Patient Instructions (Signed)
Resume Coumadin per Dentist at previous dosage 1 tablet daily except 1/2 tablet on Mondays and Fridays.  Recheck in 3 weeks.

## 2010-06-06 ENCOUNTER — Encounter: Payer: BC Managed Care – PPO | Admitting: *Deleted

## 2010-07-02 NOTE — Assessment & Plan Note (Signed)
[facility]                            CARDIOLOGY OFFICE NOTE   Timothy Cantu, Timothy Cantu                          MRN:          [id number]  DATE:03/01/2007                            DOB:          06/21/1950    Timothy Cantu returns for followup.  He is a [place] immigrant who has  nonischemic cardiomyopathy with an EF 15%.  He always amazes me in terms  of how well he is doing.  He is functional class I.  If you look at his  echocardiogram, he has a globular heart with an EF of 15% and it  basically rocks back and forth.  However, he has always worked hard.  He  has been a [employer] for a long time.  He quit drinking a while  back.  He had a couple episodes of syncope a few years ago and I tried  to get him to have a defibrillator.  The syncope may have been related  to his alcohol.  However, given the state of his heart I thought he  required a defibrillator.  He refused and continues to not want to  discuss a defibrillator.  He is active.  He is not having PND or  orthopnea.  There has been no chest pain, no lower extremity edema.   His medications are somewhat unusual.  1. He is on Coumadin for chronic a fib.  He is not having      palpitations.  He is not having any bleeding problems.  He will      have his Coumadin checked in our clinic today.  2. He is on Digitek 0.125 a day.  3. His Coreg dose assess 50 b.i.d., which is relatively high, but he      has not had any bradycardic episodes.  4. He is on captopril 25 q.i.d.   EXAM:  Remarkable for being in A fib at a rate of 80-90.  His blood  pressure is 130/80, afebrile, respiratory rate 14.  HEENT:  Unremarkable.  Carotids are without bruit, no lymphadenopathy, thyromegaly or JVP  elevation.  LUNGS:  Clear diaphragmatic motion.  No wheezing.  S1-S2.  I cannot hear an MR murmur.  His PMI is increased.  There is no  RV heave.  Abdomen is benign. He has midline scar from some sort of abdominal  surgery back in [place].  No hepatosplenomegaly or hepatojugular reflux.  No tenderness, no bruit.  Distal pulse intact, no edema.  NEURO:  Nonfocal.  No muscle weakness.  SKIN:  Warm and dry.   His EKG shows atrial fibrillation with nonspecific ST-T wave changes.   IMPRESSION:  1. Atrial fibrillation, chronic good rate control on Coreg, Coumadin      clinic today with no evidence of bleeding problems.  2. Nonischemic cardiomyopathy, functional class I.  No need for      diuretic as he is not volume overloaded.  Continue high-dose ACE      inhibitor and beta blocker.  3. A previous history of syncope, possibly related to alcohol.  The  patient refusing AICD.  4. Previous alcohol abuse, currently abstaining.  However, LV function      not improving off alcohol.  5. Overall, Timothy Cantu is doing well.  I will see him back in 6 months.      Refills were sent through [facility] to the [facility] on [address].  There appeared to be a discrepancy in his date of      birth.  I asked his son look into this is.  His date of birth is      listed in [facility] as 01/31/1951 and he says his birth      date as [birth year].     Noralyn Pick. Eden Emms, MD, [facility]  Electronically Signed    PCN/MedQ  DD: 03/01/2007  DT: 03/01/2007  Job #: [id number]

## 2010-07-05 NOTE — H&P (Signed)
NAMEBALDO, HUFNAGLE NO.:  [id number]   MEDICAL RECORD NO.:  [id number]                   PATIENT TYPE:  EMS   LOCATION:  MAJO                                 FACILITY:  [facility]   PHYSICIAN:  [name], M.D.                  DATE OF BIRTH:  July 27, 1950   DATE OF ADMISSION:  02/26/2003  DATE OF DISCHARGE:                                HISTORY & PHYSICAL   HISTORY OF PRESENT ILLNESS:  Mr. Snodgrass is a 60 year old [place] gentleman  who I have seen in the office before.  He has known nonischemic dilated  cardiomyopathy.  His EF is quite low in the 10% range.  He has done fairly  well.  He has chronic atrial fibrillation and is on Coumadin.   He was at a family party this evening.  He was drinking alcohol; however, he  had frank syncope.  His son, who speaks perfect [place], said he was out for  five to six minutes.  There was a question of some choking but he eventually  came out of it and did not remember the event.   He was feeling a little shaky last night but otherwise is not in any  significant chest pain.  He has been compliant with his medications.  Despite his significantly decreased left ventricular function, he has not  had to be hospitalized in the last five to six years since I have been  following him.  I will have to get his office records, but since he has had  a nonischemic cardiomyopathy and has been asymptomatic, I do not think we  have entertained an AICD in the past.   REVIEW OF SYMPTOMS:  Otherwise benign.  He has had no significant fevers and  no chest pain.  He does not have a previous history of valvular heart  disease.  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  It has been amazing to me in the past the amount of physical  activity he does despite his low EF.   ALLERGIES:  He has no known allergies.   MEDICATIONS:  He does not have a list of his medications but as I recall he  was on  Coreg, Digitek, Captopril, and Coumadin.   PAST MEDICAL HISTORY:  The rest of the history is somewhat limited because  he does not speak [place].   PHYSICAL EXAMINATION:  GENERAL:  He is in no distress.  VITAL SIGNS:  He has atrial fibrillation rate of 140.  LUNGS:  Mild basilar crackles.  NECK:  JVP is mildly elevated.  HEART:  There is an S1 and S2 with no murmur.  ABDOMEN:  Benign.  PULSES:  Good.  EXTREMITIES:  No edema.   LABORATORY DATA:  EKG shows atrial fibrillation with no acute changes at a  rate of 140.  His labs and chest x-ray are pending.   IMPRESSION:  I am not sure if the episode of frank syncope was precipitated  by alcohol.  The patient clearly does not appear to be drunk here in the  emergency room.  He has a known ischemic cardiomyopathy.  He is  anticoagulated on Coumadin.  The patient will have his pro time checked.  We  will hold his Coumadin.  I suspect he will need a right and left heart  catheterization when his INR is less than 2.  He will then need an  electrophysiologic study evaluation and I would strongly urge him to have a  defibrillator.   We will check his left ventricular function with a echocardiogram in the  morning.  Rate control will be done with intravenous Cardizem for the time  being.  We will try to get an accurate list of his medications from his  family.                                                [name], M.D.    PN/MEDQ  D:  02/26/2003  T:  02/26/2003  Job:  [id number]

## 2010-07-05 NOTE — Cardiovascular Report (Signed)
Timothy Cantu, Timothy Cantu                             ACCOUNT NO.:  [id number]   MEDICAL RECORD NO.:  [id number]                   PATIENT TYPE:  INP   LOCATION:  2002                                 FACILITY:  [facility]   PHYSICIAN:  Arturo Morton. Riley Kill, M.D.             DATE OF BIRTH:  12-08-1950   DATE OF PROCEDURE:  02/28/2003  DATE OF DISCHARGE:                              CARDIAC CATHETERIZATION   INDICATIONS:  The patient is a 60 year old gentleman who presents with  syncope.  He has known cardiomyopathy documented in 1999 by catheterization  by Dr. Gerri Spore.  He has been followed by Dr. Eden Emms intermittently.  He  presented with a frank episode of syncope.  The etiology was undetermined.   Risks and benefits were discussed with the son.  The patient was agreeable  to proceed on after interpretation.  He does not speak [place].   PROCEDURE:  1. Right and left heart catheterization.  2. Selective coronary arteriography.  3. Selective left ventriculography.   DESCRIPTION OF PROCEDURE:  The patient was brought to the catheterization  laboratory and prepped and draped in the usual fashion.  Through an anterior  puncture the right femoral artery was easily entered.  Left ventricular  pressures and aortic pressures with measured with pigtail.  Ventriculography  was performed in the RAO projection.  Following this coronary arteriography  was performed without complication.  Following this the femoral vein was  entered and an 8-French sheath was placed.  Pressures were measured  sequentially in the right atrium, right ventricle, pulmonary artery, and  pulmonary wedge position.  We ultimately did simultaneous pressures.  Saturations were obtained in the superior vena cava, pulmonary artery and  aorta and thermodilution cardiac outputs were performed without  complication.  He was taken to the holding area in satisfactory clinical  condition after completion of the procedure.   HEMODYNAMIC  DATA:  1. Right atrium 14.  2. Right ventricle 46/14.  3. Pulmonary artery 46/27.  4. Pulmonary capillary wedge 27 mean.  5. Aortic 126/85, mean 103.  6. Left ventricle 119/29.  7. No aortic valve gradient.  8. No mitral valve gradient.  9. Cardiac output by thermodilution 5.0.  10.      Cardiac index by thermodilution 2.1.  11.      Cardiac output by Fick 3.5 L/minute.  12.      Cardiac index by Fick 1.5 L/minute/sq m.  13.      Pulmonary artery saturation 50%.  14.      Superior vena cava saturation 52%.  15.      Aortic saturation 95%.   ANGIOGRAPHIC DATA:  1. Ventriculography was performed in the RAO projection.  There was severe     global hypokinesis with marked dilatation.  Ejection fraction would be     estimated at 20-25%.  There was 3+ mitral regurgitation probably     secondary to  ventricular dilatation.  2. The left main coronary artery is large and free of critical disease.  3. The LAD is large, travels to the apex, provides two diagonals.  There is     about 20% proximal narrowing of the LAD.  4. The circumflex provides a marginal branch and then a posterolateral     branch, both of which are free of critical disease.  5. The right coronary artery provides an acute marginal, large posterior     descending, and then there is a 40% area of narrowing radiating into the     posterolateral.   CONCLUSIONS:  1. No critical coronary artery disease.  2. Severe left ventricular dysfunction with probable secondary mitral     regurgitation.  3. Atrial fibrillation with controlled ventricular response.   PLAN:  1. EP consult.  2. Resumption of heparin in a slow, graduated fashion.                                               Arturo Morton. Riley Kill, M.D.    TDS/MEDQ  D:  02/28/2003  T:  02/28/2003  Job:  [id number]   cc:   CV Lab

## 2010-07-05 NOTE — Discharge Summary (Signed)
NAMERACE, LATOUR                             ACCOUNT NO.:  [id number]   MEDICAL RECORD NO.:  [id number]                   PATIENT TYPE:  INP   LOCATION:  2002                                 FACILITY:  [facility]   PHYSICIAN:  [name], M.D.                  DATE OF BIRTH:  [birth year]   DATE OF ADMISSION:  02/26/2003  DATE OF DISCHARGE:  03/03/2003                                 DISCHARGE SUMMARY   PROCEDURES:  1. Right heart catheterization.  2. Left heart catheterization.  3. Coronary angiography.  4. Left ventriculography.  5. A 2D echocardiogram.   HOSPITAL COURSE:  Mr. Rittenberry is a 60 year old with known nonischemic  cardiomyopathy.  His EF has been in the 10% range.  He has done fairly well.  Additionally, he has chronic atrial fibrillation and chronic  anticoagulation.  He was at a family party on the day of admission and had  frank syncope.  He had had some alcohol but did not appear intoxicated.  He  had been anticoagulated on Coumadin, and his INR was 1.6 on admission.   His INR was held, but it went to actually 2.1 the next day but then trended  down and was subtherapeutic for the catheterization.  A catheterization was  recommended to further assess his coronary anatomy, and a right heart  catheterization was recommended as well.  The cardiac catheterization shows  no critical coronary artery disease and left ventricular dysfunction with an  EF of 20-25% and 3+ MR.  Dr. Riley Kill felt that there was ventricular  dilatation.  His pulmonary artery pressures were 46/27 with a thick cardiac  output, 3.5 cardiac index and 1.5.  By thermodilution, his cardiac output  was 5.0, and his cardiac index was 2.1.  His wedge was 27.  His right atrial  pressure was 14.  It was felt that this was a nonischemic cardiomyopathy,  and he needed diuresis and heart rate control.   He had been in atrial fibrillation with a rapid rate upon arrival, and he  was started on IV Diltiazem.  This  was continued, but Dr. Eden Emms felt that  he could decrease the Coreg with good heart rate control.  So the IV  Diltiazem was discontinued.  He is to follow up as an outpatient.  Additionally, a 2D echocardiogram was performed which showed an EF of 25-30%  with moderate-to-severe MR and the proximal iso-velocity to surface area was  0.35 cm squared.  The volume of his MR by proximal iso-velocity was 49 cc.  The left atrium was moderately dilated, and the right ventricular systolic  pressure  was increased at 43.  The right atrium was mildly to moderately  dilated as well.  No significant pauses were seen.  It is unclear why he had  frank syncope, so an EP consult was called.  He was evaluated by Dr. Graciela Husbands,  who felt that his symptoms were concerning for arrhythmia and __________.  Mr. Bogdon was consistent and emphatic in refusing any device implantation.  Since he refused an ICD, his Coumadin was restarted.   By March 01, 2003, his respiratory rate was at baseline, and his heart  rate was under good control.  His INR was 1.2.  Dr. Eden Emms felt that he  could be reanticoagulated as an outpatient.   He is to follow up with cardiology in two weeks and with the Coumadin clinic  next Thursday.  He is considered stable for discharge on March 03, 2003.   LABORATORY VALUES:  Hemoglobin 17.0, hematocrit 50.9, WBCs 8.7, platelets  151.  Sodium 138, potassium 4.0, chloride 107, CO2 25, BUN 9, creatinine  1.0, glucose 100.  AST 53, ALT 52, alkaline phosphatase 71, total bilirubin  1.4.  Calcium 9.1.  Alcohol level 52.  Dig level 0.3.  ABG shows pH 7.49,  pCO2 30, pO2 67, bicarb 22.7, sats 95%.  Weight at discharge 240.8.  INR  1.2.   CHEST X-RAY:  Cardiomegaly, pulmonary venous hypertension, and minimal  interstitial edema.   DISCHARGE CONDITION:  Improved.   DISCHARGE DIAGNOSES:  1. Syncope.  2. Nonischemic cardiomyopathy with no critical coronary artery disease by     catheterization this  admission, and an ejection fraction of 20-25% by     catheterization, 25-30% by echocardiogram.  3. Congestive heart failure.  4. Elevated pulmonary artery pressures at 46/27 with a wedge of 27 by right     heart catheterization this admission.  5. Chronic anticoagulation.  6. Chronic atrial fibrillation.  7. Status post electrophysiology evaluation, with the patient refusing an     implantable cardiac defibrillator.   DISCHARGE INSTRUCTIONS:  1. His activity level is to be as tolerated.  2. He is to stick to a low-fat and low-salt diet and limit alcohol.  3. He is to call the office for problems with cath site.  4. He is to follow up with the Coumadin clinic on January 20th at 10:45 and     follow up with Dr. Eden Emms on February 10th at 11 a.m.   DISCHARGE MEDICATIONS:  1. Coreg 12.5 mg p.o. b.i.d.  2. Captopril 25 mg t.i.d.  3. Digoxin 0.25 mg q.d.  4. Coumadin as prior to admission.      Theodore Demark, P.A. [facility]                  [name], M.D.    RB/MEDQ  D:  03/03/2003  T:  03/04/2003  Job:  [id number]   cc:   Shelby Dubin, MD

## 2010-07-05 NOTE — Assessment & Plan Note (Signed)
[facility]                            CARDIOLOGY OFFICE NOTE   KALAI, BACA                          MRN:          [id number]  DATE:01/12/2006                            DOB:          05/31/1950    Timothy Cantu returns today for followup.  Timothy Cantu was not with him today.  His other son, Timothy Cantu was.  We had another long discussion about the need  for defibrillator.  Timothy Cantu has had extreme nonischemic cardiomyopathy for  many years.  His EF is 10% to 15%.  He had a syncopal episode a couple  of years ago, which was probably related to alcohol.  He has been  abstinent from this.  He needs his EF rechecked from a cardiac  perspective.  He is still working at [employer].  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  He has not had any recurrent syncope or  palpitations, chest pain, PND, or orthopnea.  He is functional class I.   MEDICATIONS:  Include Digitek 0.25 a day, Coreg 25 b.i.d., captopril 25  q.i.d., and a baby aspirin a day.  He also takes Coumadin as directed.   EXAM:  Remarkable for being in atrial fibrillation at a rate of 70.  HEENT:  Normal.  There is no lymphadenopathy.  No carotid bruits.  LUNGS:  Clear.  There is an S1, S2 with normal heart sounds.  PMI is increased.  JVP is  not elevated.  I cannot hear an MR murmur.  ABDOMEN:  Benign.  LOWER EXTREMITIES:  Intact pulses.  No edema.  NEURO:  Nonfocal.  SKIN:  Warm and dry.   IMPRESSION:  Chronic nonischemic cardiomyopathy, ejection fraction 15%.  Followup echo to reassess heart function.   His a fib is well-rate-controlled.  He does not have a wide QRS and he  is in a good functional class.  I would like for him to followup with  Dr. Graciela Husbands in regards to possibility of an AICD given his ejection  fraction being so low.  I do not think that he would be a  resynchronization candidate, given his current functional class and very  narrow QRS with chronic atrial fibrillation.   I  will see him back in about 6 months.   He continues to do remarkably well despite his poor LV function.     Timothy Cantu. Eden Emms, MD, [facility]  Electronically Signed    PCN/MedQ  DD: 01/12/2006  DT: 01/12/2006  Job #: [id number]

## 2010-07-24 ENCOUNTER — Ambulatory Visit (INDEPENDENT_AMBULATORY_CARE_PROVIDER_SITE_OTHER): Payer: BC Managed Care – PPO | Admitting: *Deleted

## 2010-07-24 DIAGNOSIS — I4891 Unspecified atrial fibrillation: Secondary | ICD-10-CM

## 2010-07-24 LAB — POCT INR: INR: 2.4

## 2010-08-22 ENCOUNTER — Encounter: Payer: BC Managed Care – PPO | Admitting: *Deleted

## 2010-10-08 ENCOUNTER — Encounter: Payer: BC Managed Care – PPO | Admitting: *Deleted

## 2010-10-10 ENCOUNTER — Ambulatory Visit (INDEPENDENT_AMBULATORY_CARE_PROVIDER_SITE_OTHER): Payer: BC Managed Care – PPO | Admitting: *Deleted

## 2010-10-10 DIAGNOSIS — I4891 Unspecified atrial fibrillation: Secondary | ICD-10-CM

## 2010-10-10 LAB — POCT INR: INR: 2

## 2010-11-07 ENCOUNTER — Encounter: Payer: BC Managed Care – PPO | Admitting: *Deleted

## 2011-01-02 ENCOUNTER — Ambulatory Visit (INDEPENDENT_AMBULATORY_CARE_PROVIDER_SITE_OTHER): Payer: BC Managed Care – PPO | Admitting: *Deleted

## 2011-01-02 DIAGNOSIS — Z7901 Long term (current) use of anticoagulants: Secondary | ICD-10-CM

## 2011-01-02 DIAGNOSIS — I4891 Unspecified atrial fibrillation: Secondary | ICD-10-CM

## 2011-01-02 LAB — POCT INR: INR: 3.1

## 2011-01-23 ENCOUNTER — Encounter: Payer: BC Managed Care – PPO | Admitting: *Deleted

## 2011-02-19 ENCOUNTER — Other Ambulatory Visit: Payer: Self-pay | Admitting: Cardiovascular Disease

## 2011-03-03 ENCOUNTER — Encounter: Payer: Self-pay | Admitting: *Deleted

## 2011-03-05 ENCOUNTER — Encounter: Payer: Self-pay | Admitting: Cardiovascular Disease

## 2011-03-05 ENCOUNTER — Ambulatory Visit (INDEPENDENT_AMBULATORY_CARE_PROVIDER_SITE_OTHER): Payer: BC Managed Care – PPO | Admitting: Cardiovascular Disease

## 2011-03-05 ENCOUNTER — Ambulatory Visit (INDEPENDENT_AMBULATORY_CARE_PROVIDER_SITE_OTHER): Payer: BC Managed Care – PPO | Admitting: *Deleted

## 2011-03-05 VITALS — BP 153/104 | HR 80 | Ht 74.0 in | Wt 260.0 lb

## 2011-03-05 DIAGNOSIS — I4891 Unspecified atrial fibrillation: Secondary | ICD-10-CM

## 2011-03-05 LAB — POCT INR: INR: 2

## 2011-03-05 MED ORDER — CARVEDILOL 25 MG PO TABS: 25.0000 mg | ORAL_TABLET | Freq: Two times a day (BID) | ORAL | Status: DC

## 2011-03-05 MED ORDER — CAPTOPRIL 25 MG PO TABS: 25.0000 mg | ORAL_TABLET | Freq: Three times a day (TID) | ORAL | Status: DC

## 2011-03-05 NOTE — Patient Instructions (Signed)
Your physician wants you to follow-up in: YEAR WITH DR NISHAN  You will receive a reminder letter in the mail two months in advance. If you don't receive a letter, please call our office to schedule the follow-up appointment.  Your physician recommends that you continue on your current medications as directed. Please refer to the Current Medication list given to you today. 

## 2011-03-05 NOTE — Assessment & Plan Note (Signed)
F/U clinic today No bleeding problems  Indication afib  No need for lovenox overlapp

## 2011-03-05 NOTE — Assessment & Plan Note (Signed)
Rate up today as he has not taken beta blocker last 3 days.  Resume refills called in

## 2011-03-05 NOTE — Progress Notes (Signed)
Patient ID: Timothy Cantu, male   DOB: March 04, 1950, 61 y.o.   MRN: [id number] [name] has a history of DCM with EF 15%. It was likely from ETOH. His EF had returned to normal with abstinance. He has chronic afib and is on coumadin. He has not had any bleeding problems. He continues to work at [employer]. He had a cath in 2005 after syncope and has no CAD. He has not had recurrent syncope since he stopped drinking. He had a follow up echo last year that demonstrated ejection fraction of 50% to 55%. Wall motion was normal; there were no regional wall motion abnormalities, mild MR, severely dilated LA, mildly to moderately dilated RA. Today, the patient denies chest pain, dyspnea, orthopnea, PND, pedal edema or syncope. The patient describes NYHA Class 1 symptoms.   Ran out of meds and has not taken ACE/BB last 3 days.  Has coumadin clinic today   ROS: Denies fever, malais, weight loss, blurry vision, decreased visual acuity, cough, sputum, SOB, hemoptysis, pleuritic pain, palpitaitons, heartburn, abdominal pain, melena, lower extremity edema, claudication, or rash.  All other systems reviewed and negative  General: Affect appropriate Healthy:  appears stated age HEENT: normal Neck supple with no adenopathy JVP normal no bruits no thyromegaly Lungs clear with no wheezing and good diaphragmatic motion Heart:  S1/S2 no murmur,rub, gallop or click PMI normal Abdomen: benighn, BS positve, no tenderness, no AAA no bruit.  No HSM or HJR Distal pulses intact with no bruits No edema Neuro non-focal Skin warm and dry No muscular weakness   Current Outpatient Prescriptions  Medication Sig Dispense Refill  . captopril (CAPOTEN) 25 MG tablet Take 25 mg by mouth 3 (three) times daily.      . carvedilol (COREG) 25 MG tablet TAKE ONE TABLET BY MOUTH TWICE DAILY  180 tablet  1  . warfarin (COUMADIN) 5 MG tablet Take by mouth as directed.          Allergies  Review of patient's allergies indicates no known  allergies.  Electrocardiogram:  Atrial fibrillaiton rate 95 nonspecific T wave changes  Assessment and Plan e

## 2011-03-05 NOTE — Assessment & Plan Note (Signed)
EF improved when he stopped drinking  Euvolemic Continue ACE and beta blocker

## 2011-04-02 ENCOUNTER — Encounter: Payer: BC Managed Care – PPO | Admitting: *Deleted

## 2011-04-11 ENCOUNTER — Other Ambulatory Visit: Payer: Self-pay | Admitting: Cardiovascular Disease

## 2011-04-11 ENCOUNTER — Telehealth: Payer: Self-pay | Admitting: *Deleted

## 2011-04-11 NOTE — Telephone Encounter (Signed)
Left message for pt for appointment to coumadin clinic prior to coumadin script  refill, last appt was January 2013, missed February appointment.

## 2011-04-11 NOTE — Telephone Encounter (Signed)
Left pt message needs appointment

## 2011-04-14 ENCOUNTER — Telehealth: Payer: Self-pay | Admitting: Cardiovascular Disease

## 2011-04-14 NOTE — Telephone Encounter (Signed)
[facility] needs refill of coumadin

## 2011-04-15 ENCOUNTER — Ambulatory Visit (INDEPENDENT_AMBULATORY_CARE_PROVIDER_SITE_OTHER): Payer: BC Managed Care – PPO

## 2011-04-15 DIAGNOSIS — Z7901 Long term (current) use of anticoagulants: Secondary | ICD-10-CM

## 2011-04-15 DIAGNOSIS — I4891 Unspecified atrial fibrillation: Secondary | ICD-10-CM

## 2011-04-15 LAB — POCT INR: INR: 1.6

## 2011-04-15 MED ORDER — WARFARIN SODIUM 5 MG PO TABS: ORAL_TABLET | ORAL | Status: DC

## 2011-04-15 NOTE — Telephone Encounter (Signed)
Rx done during visit this AM

## 2011-05-06 ENCOUNTER — Ambulatory Visit (INDEPENDENT_AMBULATORY_CARE_PROVIDER_SITE_OTHER): Payer: BC Managed Care – PPO

## 2011-05-06 DIAGNOSIS — I4891 Unspecified atrial fibrillation: Secondary | ICD-10-CM

## 2011-05-06 DIAGNOSIS — Z7901 Long term (current) use of anticoagulants: Secondary | ICD-10-CM

## 2011-05-06 MED ORDER — WARFARIN SODIUM 5 MG PO TABS: ORAL_TABLET | ORAL | Status: DC

## 2011-07-22 ENCOUNTER — Ambulatory Visit (INDEPENDENT_AMBULATORY_CARE_PROVIDER_SITE_OTHER): Payer: BC Managed Care – PPO

## 2011-07-22 DIAGNOSIS — I4891 Unspecified atrial fibrillation: Secondary | ICD-10-CM

## 2011-07-22 DIAGNOSIS — Z7901 Long term (current) use of anticoagulants: Secondary | ICD-10-CM

## 2011-07-22 MED ORDER — WARFARIN SODIUM 5 MG PO TABS: ORAL_TABLET | ORAL | Status: DC

## 2011-11-26 ENCOUNTER — Ambulatory Visit (INDEPENDENT_AMBULATORY_CARE_PROVIDER_SITE_OTHER): Payer: BC Managed Care – PPO | Admitting: Pharmacist

## 2011-11-26 DIAGNOSIS — I4891 Unspecified atrial fibrillation: Secondary | ICD-10-CM

## 2011-11-26 DIAGNOSIS — Z7901 Long term (current) use of anticoagulants: Secondary | ICD-10-CM

## 2011-11-26 LAB — POCT INR: INR: 3.3

## 2011-11-26 MED ORDER — WARFARIN SODIUM 5 MG PO TABS: ORAL_TABLET | ORAL | Status: DC

## 2012-01-27 ENCOUNTER — Ambulatory Visit (INDEPENDENT_AMBULATORY_CARE_PROVIDER_SITE_OTHER): Payer: BC Managed Care – PPO | Admitting: *Deleted

## 2012-01-27 DIAGNOSIS — Z7901 Long term (current) use of anticoagulants: Secondary | ICD-10-CM

## 2012-01-27 DIAGNOSIS — I4891 Unspecified atrial fibrillation: Secondary | ICD-10-CM

## 2012-01-27 MED ORDER — WARFARIN SODIUM 5 MG PO TABS: ORAL_TABLET | ORAL | Status: DC

## 2012-02-24 ENCOUNTER — Ambulatory Visit (INDEPENDENT_AMBULATORY_CARE_PROVIDER_SITE_OTHER): Payer: BC Managed Care – PPO | Admitting: *Deleted

## 2012-02-24 DIAGNOSIS — I4891 Unspecified atrial fibrillation: Secondary | ICD-10-CM

## 2012-02-24 DIAGNOSIS — Z7901 Long term (current) use of anticoagulants: Secondary | ICD-10-CM

## 2012-02-24 MED ORDER — WARFARIN SODIUM 5 MG PO TABS: ORAL_TABLET | ORAL | Status: DC

## 2012-03-05 ENCOUNTER — Other Ambulatory Visit: Payer: Self-pay | Admitting: *Deleted

## 2012-03-05 MED ORDER — CAPTOPRIL 25 MG PO TABS: 25.0000 mg | ORAL_TABLET | Freq: Three times a day (TID) | ORAL | Status: DC

## 2012-03-10 ENCOUNTER — Ambulatory Visit (INDEPENDENT_AMBULATORY_CARE_PROVIDER_SITE_OTHER): Payer: PRIVATE HEALTH INSURANCE | Admitting: Cardiovascular Disease

## 2012-03-10 ENCOUNTER — Encounter: Payer: Self-pay | Admitting: Cardiovascular Disease

## 2012-03-10 ENCOUNTER — Other Ambulatory Visit: Payer: Self-pay | Admitting: *Deleted

## 2012-03-10 VITALS — BP 132/88 | HR 82 | Ht 74.0 in | Wt 269.0 lb

## 2012-03-10 DIAGNOSIS — I4891 Unspecified atrial fibrillation: Secondary | ICD-10-CM

## 2012-03-10 MED ORDER — WARFARIN SODIUM 5 MG PO TABS: ORAL_TABLET | ORAL | Status: DC

## 2012-03-10 MED ORDER — CAPTOPRIL 25 MG PO TABS: 25.0000 mg | ORAL_TABLET | Freq: Three times a day (TID) | ORAL | Status: DC

## 2012-03-10 MED ORDER — CARVEDILOL 25 MG PO TABS: 25.0000 mg | ORAL_TABLET | Freq: Two times a day (BID) | ORAL | Status: DC

## 2012-03-10 NOTE — Progress Notes (Signed)
Patient ID: Timothy Cantu, male   DOB: [birth year], 62 y.o.   MRN: [id number] Timothy Cantu has a history of DCM with EF 15%. It was likely from ETOH. His EF had returned to normal with abstinance. He has chronic afib and is on coumadin. He has not had any bleeding problems. He continues to work at [employer]. He had a cath in 2005 after syncope and has no CAD. He has not had recurrent syncope since he stopped drinking. He had a follow up echo last year that demonstrated ejection fraction of 50% to 55%. Wall motion was normal; there were no regional wall motion abnormalities, mild MR, severely dilated LA, mildly to moderately dilated RA. Today, the patient denies chest pain, dyspnea, orthopnea, PND, pedal edema or syncope. The patient describes NYHA Class 1 symptoms.  Med refills given today  ROS: Denies fever, malais, weight loss, blurry vision, decreased visual acuity, cough, sputum, SOB, hemoptysis, pleuritic pain, palpitaitons, heartburn, abdominal pain, melena, lower extremity edema, claudication, or rash.  All other systems reviewed and negative  General: Affect appropriate Healthy:  appears stated age HEENT: normal Neck supple with no adenopathy JVP normal no bruits no thyromegaly Lungs clear with no wheezing and good diaphragmatic motion Heart:  S1/S2 no murmur, no rub, gallop or click PMI normal Abdomen: benighn, BS positve, no tenderness, no AAA no bruit.  No HSM or HJR Distal pulses intact with no bruits No edema Neuro non-focal Skin warm and dry No muscular weakness   Current Outpatient Prescriptions  Medication Sig Dispense Refill  . captopril (CAPOTEN) 25 MG tablet Take 1 tablet (25 mg total) by mouth 3 (three) times daily.  180 tablet  0  . carvedilol (COREG) 25 MG tablet Take 1 tablet (25 mg total) by mouth 2 (two) times daily with a meal.  60 tablet  11  . warfarin (COUMADIN) 5 MG tablet Take as directed by anticoagulation clinic  30 tablet  1    Allergies  Review of patient's  allergies indicates no known allergies.  Electrocardiogram:  Afib rate 82 flat T waves in lead V6  Assessment and Plan

## 2012-03-10 NOTE — Assessment & Plan Note (Signed)
Good anticoagulation and rate control  ECG with no changes today

## 2012-03-10 NOTE — Assessment & Plan Note (Signed)
Euvolemic funcitonal class one continue current meds

## 2012-03-10 NOTE — Patient Instructions (Signed)
Your physician wants you to follow-up in: YEAR WITH DR NISHAN  You will receive a reminder letter in the mail two months in advance. If you don't receive a letter, please call our office to schedule the follow-up appointment.  Your physician recommends that you continue on your current medications as directed. Please refer to the Current Medication list given to you today. 

## 2012-03-10 NOTE — Telephone Encounter (Signed)
error 

## 2012-03-10 NOTE — Assessment & Plan Note (Signed)
Doing well consider f/u MRI or echo in a year EF improved

## 2012-05-26 ENCOUNTER — Ambulatory Visit (INDEPENDENT_AMBULATORY_CARE_PROVIDER_SITE_OTHER): Payer: PRIVATE HEALTH INSURANCE | Admitting: *Deleted

## 2012-05-26 DIAGNOSIS — Z7901 Long term (current) use of anticoagulants: Secondary | ICD-10-CM

## 2012-05-26 DIAGNOSIS — I4891 Unspecified atrial fibrillation: Secondary | ICD-10-CM

## 2012-06-16 ENCOUNTER — Ambulatory Visit (INDEPENDENT_AMBULATORY_CARE_PROVIDER_SITE_OTHER): Payer: PRIVATE HEALTH INSURANCE | Admitting: *Deleted

## 2012-06-16 DIAGNOSIS — I4891 Unspecified atrial fibrillation: Secondary | ICD-10-CM

## 2012-06-16 DIAGNOSIS — Z7901 Long term (current) use of anticoagulants: Secondary | ICD-10-CM

## 2012-06-16 LAB — POCT INR: INR: 2.7

## 2012-07-14 ENCOUNTER — Ambulatory Visit (INDEPENDENT_AMBULATORY_CARE_PROVIDER_SITE_OTHER): Payer: PRIVATE HEALTH INSURANCE | Admitting: *Deleted

## 2012-07-14 DIAGNOSIS — Z7901 Long term (current) use of anticoagulants: Secondary | ICD-10-CM

## 2012-07-14 DIAGNOSIS — I4891 Unspecified atrial fibrillation: Secondary | ICD-10-CM

## 2012-07-14 MED ORDER — WARFARIN SODIUM 5 MG PO TABS: ORAL_TABLET | ORAL | Status: DC

## 2012-07-28 ENCOUNTER — Ambulatory Visit (INDEPENDENT_AMBULATORY_CARE_PROVIDER_SITE_OTHER): Payer: PRIVATE HEALTH INSURANCE | Admitting: *Deleted

## 2012-07-28 DIAGNOSIS — I4891 Unspecified atrial fibrillation: Secondary | ICD-10-CM

## 2012-07-28 DIAGNOSIS — Z7901 Long term (current) use of anticoagulants: Secondary | ICD-10-CM

## 2012-11-02 ENCOUNTER — Ambulatory Visit (INDEPENDENT_AMBULATORY_CARE_PROVIDER_SITE_OTHER): Payer: PRIVATE HEALTH INSURANCE | Admitting: Pharmacist

## 2012-11-02 DIAGNOSIS — I4891 Unspecified atrial fibrillation: Secondary | ICD-10-CM

## 2012-11-02 DIAGNOSIS — Z7901 Long term (current) use of anticoagulants: Secondary | ICD-10-CM

## 2012-11-02 LAB — POCT INR: INR: 1.8

## 2012-11-02 MED ORDER — WARFARIN SODIUM 5 MG PO TABS: ORAL_TABLET | ORAL | Status: DC

## 2012-12-29 ENCOUNTER — Ambulatory Visit (INDEPENDENT_AMBULATORY_CARE_PROVIDER_SITE_OTHER): Payer: PRIVATE HEALTH INSURANCE | Admitting: *Deleted

## 2012-12-29 DIAGNOSIS — Z7901 Long term (current) use of anticoagulants: Secondary | ICD-10-CM

## 2012-12-29 DIAGNOSIS — I4891 Unspecified atrial fibrillation: Secondary | ICD-10-CM

## 2012-12-29 MED ORDER — WARFARIN SODIUM 5 MG PO TABS: ORAL_TABLET | ORAL | Status: DC

## 2013-03-16 ENCOUNTER — Ambulatory Visit (INDEPENDENT_AMBULATORY_CARE_PROVIDER_SITE_OTHER): Payer: PRIVATE HEALTH INSURANCE | Admitting: Cardiovascular Disease

## 2013-03-16 ENCOUNTER — Ambulatory Visit (INDEPENDENT_AMBULATORY_CARE_PROVIDER_SITE_OTHER): Payer: PRIVATE HEALTH INSURANCE | Admitting: *Deleted

## 2013-03-16 VITALS — BP 122/68 | HR 80 | Ht 72.0 in | Wt 255.8 lb

## 2013-03-16 DIAGNOSIS — Z5181 Encounter for therapeutic drug level monitoring: Secondary | ICD-10-CM | POA: Insufficient documentation

## 2013-03-16 DIAGNOSIS — I4891 Unspecified atrial fibrillation: Secondary | ICD-10-CM

## 2013-03-16 DIAGNOSIS — Z7901 Long term (current) use of anticoagulants: Secondary | ICD-10-CM

## 2013-03-16 DIAGNOSIS — I509 Heart failure, unspecified: Secondary | ICD-10-CM

## 2013-03-16 DIAGNOSIS — I429 Cardiomyopathy, unspecified: Secondary | ICD-10-CM

## 2013-03-16 DIAGNOSIS — I428 Other cardiomyopathies: Secondary | ICD-10-CM

## 2013-03-16 LAB — POCT INR: INR: 2.4

## 2013-03-16 MED ORDER — CARVEDILOL 25 MG PO TABS: 25.0000 mg | ORAL_TABLET | Freq: Two times a day (BID) | ORAL | Status: DC

## 2013-03-16 MED ORDER — WARFARIN SODIUM 5 MG PO TABS: ORAL_TABLET | ORAL | Status: DC

## 2013-03-16 MED ORDER — CAPTOPRIL 25 MG PO TABS: 25.0000 mg | ORAL_TABLET | Freq: Two times a day (BID) | ORAL | Status: DC

## 2013-03-16 NOTE — Patient Instructions (Signed)

## 2013-03-16 NOTE — Progress Notes (Signed)
Patient ID: Timothy Cantu, male   DOB: [birth year], 63 y.o.   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 It was likely from ETOH. His EF had returned to normal with abstinance. He has chronic afib and is on coumadin. He has not had any bleeding problems. He continues to work at [employer]. He had a cath in 2005 after syncope and has no CAD. He has not had recurrent syncope since he stopped drinking. He had a follow up echo last year that demonstrated ejection fraction of 50% to 55%. Wall motion was normal; there were no regional wall motion abnormalities, mild MR, severely dilated LA, mildly to moderately dilated RA. Today, the patient denies chest pain, dyspnea, orthopnea, PND, pedal edema or syncope. The patient describes NYHA Class 1 symptoms.  Med refills given today       ROS: Denies fever, malais, weight loss, blurry vision, decreased visual acuity, cough, sputum, SOB, hemoptysis, pleuritic pain, palpitaitons, heartburn, abdominal pain, melena, lower extremity edema, claudication, or rash.  All other systems reviewed and negative  General: Affect appropriate Healthy:  appears stated age HEENT: normal Neck supple with no adenopathy JVP normal no bruits no thyromegaly Lungs clear with no wheezing and good diaphragmatic motion Heart:  S1/S2 no murmur, no rub, gallop or click PMI normal Abdomen: benighn, BS positve, no tenderness, no AAA no bruit.  No HSM or HJR Distal pulses intact with no bruits No edema Neuro non-focal Skin warm and dry No muscular weakness   Current Outpatient Prescriptions  Medication Sig Dispense Refill  . captopril (CAPOTEN) 25 MG tablet Take 1 tablet (25 mg total) by mouth 3 (three) times daily.  90 tablet  11  . carvedilol (COREG) 25 MG tablet Take 1 tablet (25 mg total) by mouth 2 (two) times daily with a meal.  60 tablet  11  . warfarin (COUMADIN) 5 MG tablet Take as directed by anticoagulation clinic  30 tablet  1   No current  facility-administered medications for this visit.    Allergies  Review of patient's allergies indicates no known allergies.  Electrocardiogram:  afib rate 85 othewise normal   Assessment and Plan

## 2013-03-16 NOTE — Assessment & Plan Note (Signed)
Good rate control and anticoagulation  

## 2013-03-16 NOTE — Assessment & Plan Note (Signed)
Stable would like to repeat echo as last one was years ago Continue current meds and abstinence  form drinking

## 2013-04-06 ENCOUNTER — Ambulatory Visit (HOSPITAL_COMMUNITY): Payer: PRIVATE HEALTH INSURANCE | Attending: Cardiovascular Disease | Admitting: Cardiology

## 2013-04-06 DIAGNOSIS — I428 Other cardiomyopathies: Secondary | ICD-10-CM

## 2013-04-06 DIAGNOSIS — I4891 Unspecified atrial fibrillation: Secondary | ICD-10-CM | POA: Insufficient documentation

## 2013-04-06 DIAGNOSIS — Z87891 Personal history of nicotine dependence: Secondary | ICD-10-CM | POA: Insufficient documentation

## 2013-04-06 DIAGNOSIS — I509 Heart failure, unspecified: Secondary | ICD-10-CM | POA: Insufficient documentation

## 2013-04-06 DIAGNOSIS — I429 Cardiomyopathy, unspecified: Secondary | ICD-10-CM

## 2013-04-06 DIAGNOSIS — I059 Rheumatic mitral valve disease, unspecified: Secondary | ICD-10-CM | POA: Insufficient documentation

## 2013-04-06 NOTE — Progress Notes (Signed)
Echo performed. 

## 2013-04-27 ENCOUNTER — Ambulatory Visit (INDEPENDENT_AMBULATORY_CARE_PROVIDER_SITE_OTHER): Payer: PRIVATE HEALTH INSURANCE | Admitting: *Deleted

## 2013-04-27 DIAGNOSIS — Z7901 Long term (current) use of anticoagulants: Secondary | ICD-10-CM

## 2013-04-27 DIAGNOSIS — Z5181 Encounter for therapeutic drug level monitoring: Secondary | ICD-10-CM

## 2013-04-27 DIAGNOSIS — I4891 Unspecified atrial fibrillation: Secondary | ICD-10-CM

## 2013-04-27 LAB — POCT INR: INR: 2.4

## 2013-04-27 MED ORDER — WARFARIN SODIUM 5 MG PO TABS: ORAL_TABLET | ORAL | Status: DC

## 2013-06-08 ENCOUNTER — Ambulatory Visit (INDEPENDENT_AMBULATORY_CARE_PROVIDER_SITE_OTHER): Payer: PRIVATE HEALTH INSURANCE | Admitting: *Deleted

## 2013-06-08 DIAGNOSIS — I4891 Unspecified atrial fibrillation: Secondary | ICD-10-CM

## 2013-06-08 DIAGNOSIS — Z7901 Long term (current) use of anticoagulants: Secondary | ICD-10-CM

## 2013-06-08 DIAGNOSIS — Z5181 Encounter for therapeutic drug level monitoring: Secondary | ICD-10-CM

## 2013-06-08 LAB — POCT INR: INR: 2

## 2013-08-04 ENCOUNTER — Ambulatory Visit (INDEPENDENT_AMBULATORY_CARE_PROVIDER_SITE_OTHER): Payer: PRIVATE HEALTH INSURANCE | Admitting: *Deleted

## 2013-08-04 DIAGNOSIS — I4891 Unspecified atrial fibrillation: Secondary | ICD-10-CM

## 2013-08-04 DIAGNOSIS — Z7901 Long term (current) use of anticoagulants: Secondary | ICD-10-CM

## 2013-08-04 DIAGNOSIS — Z5181 Encounter for therapeutic drug level monitoring: Secondary | ICD-10-CM

## 2013-08-04 LAB — POCT INR: INR: 1.9

## 2013-09-08 ENCOUNTER — Ambulatory Visit (INDEPENDENT_AMBULATORY_CARE_PROVIDER_SITE_OTHER): Payer: PRIVATE HEALTH INSURANCE | Admitting: *Deleted

## 2013-09-08 DIAGNOSIS — I4891 Unspecified atrial fibrillation: Secondary | ICD-10-CM

## 2013-09-08 DIAGNOSIS — Z7901 Long term (current) use of anticoagulants: Secondary | ICD-10-CM

## 2013-09-08 DIAGNOSIS — Z5181 Encounter for therapeutic drug level monitoring: Secondary | ICD-10-CM

## 2013-09-08 LAB — POCT INR: INR: 1.9

## 2013-09-08 MED ORDER — WARFARIN SODIUM 5 MG PO TABS: ORAL_TABLET | ORAL | Status: DC

## 2013-10-12 ENCOUNTER — Ambulatory Visit (INDEPENDENT_AMBULATORY_CARE_PROVIDER_SITE_OTHER): Payer: PRIVATE HEALTH INSURANCE | Admitting: Cardiovascular Disease

## 2013-10-12 ENCOUNTER — Ambulatory Visit (INDEPENDENT_AMBULATORY_CARE_PROVIDER_SITE_OTHER): Payer: PRIVATE HEALTH INSURANCE | Admitting: *Deleted

## 2013-10-12 ENCOUNTER — Encounter: Payer: Self-pay | Admitting: Cardiovascular Disease

## 2013-10-12 VITALS — BP 120/88 | HR 96 | Ht 72.0 in | Wt 276.0 lb

## 2013-10-12 DIAGNOSIS — I4891 Unspecified atrial fibrillation: Secondary | ICD-10-CM

## 2013-10-12 DIAGNOSIS — Z7901 Long term (current) use of anticoagulants: Secondary | ICD-10-CM

## 2013-10-12 DIAGNOSIS — Z5181 Encounter for therapeutic drug level monitoring: Secondary | ICD-10-CM

## 2013-10-12 DIAGNOSIS — I482 Chronic atrial fibrillation, unspecified: Secondary | ICD-10-CM

## 2013-10-12 LAB — POCT INR: INR: 1.9

## 2013-10-12 NOTE — Progress Notes (Signed)
Patient ID: Timothy Cantu, male   DOB: 1950-07-21, 63 y.o.   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 It was likely from ETOH. His EF had returned to near  normal with abstinance. He has chronic afib and is on coumadin. He has not had any bleeding problems. He continues to work at [employer]. He had a cath in 2005 after syncope and has no CAD. He has not had recurrent syncope since he stopped drinking. He had a follow up echo last year that demonstrated ejection fraction of 50% to 55%. Wall motion was normal; there were no regional wall motion abnormalities, mild MR, severely dilated LA, mildly to moderately dilated RA. Today, the patient denies chest pain, dyspnea, orthopnea, PND, pedal edema or syncope. The patient describes NYHA Class 1 symptoms.   Med refills given today Last echo 2/15    Study Conclusions  - Left ventricle: The cavity size was mildly dilated. Wall thickness was increased in a pattern of mild LVH. Systolic function was mildly to moderately reduced. The estimated ejection fraction was in the range of 40% to 45%. - Mitral valve: Mild regurgitation. - Left atrium: The atrium was severely dilated. - Right atrium: The atrium was mildly dilated.     ROS: Denies fever, malais, weight loss, blurry vision, decreased visual acuity, cough, sputum, SOB, hemoptysis, pleuritic pain, palpitaitons, heartburn, abdominal pain, melena, lower extremity edema, claudication, or rash.  All other systems reviewed and negative  General: Affect appropriate Healthy:  appears stated age HEENT: normal Neck supple with no adenopathy JVP normal no bruits no thyromegaly Lungs clear with no wheezing and good diaphragmatic motion Heart:  S1/S2 no murmur, no rub, gallop or click PMI normal Abdomen: benighn, BS positve, no tenderness, no AAA no bruit.  No HSM or HJR Distal pulses intact with no bruits No edema Neuro non-focal Skin warm and dry No muscular weakness   Current  Outpatient Prescriptions  Medication Sig Dispense Refill  . captopril (CAPOTEN) 25 MG tablet Take 1 tablet (25 mg total) by mouth 2 (two) times daily.  60 tablet  11  . carvedilol (COREG) 25 MG tablet Take 1 tablet (25 mg total) by mouth 2 (two) times daily with a meal.  60 tablet  11  . warfarin (COUMADIN) 5 MG tablet Take as directed by anticoagulation clinic  35 tablet  3   No current facility-administered medications for this visit.    Allergies  Review of patient's allergies indicates no known allergies.  Electrocardiogram:  afib rate 82  Poor R wave progression  Today afib rate 96  Nonspecific ST changes   Assessment and Plan

## 2013-10-12 NOTE — Assessment & Plan Note (Signed)
Good rate control and anticoagulation f/u INR coumadin clinic today

## 2013-10-12 NOTE — Patient Instructions (Signed)
Your physician wants you to follow-up in:  6 MONTHS WITH DR NISHAN  You will receive a reminder letter in the mail two months in advance. If you don't receive a letter, please call our office to schedule the follow-up appointment. Your physician recommends that you continue on your current medications as directed. Please refer to the Current Medication list given to you today. 

## 2013-10-12 NOTE — Assessment & Plan Note (Signed)
Euvolemic functional class one  Continue current meds 

## 2014-01-04 ENCOUNTER — Telehealth: Payer: Self-pay | Admitting: *Deleted

## 2014-01-04 NOTE — Telephone Encounter (Signed)
Attempted to call the patient and unable to get through. Tried to call the alternate number, too. Unable to get through on either number. States the numbers are invalid.

## 2014-01-06 ENCOUNTER — Ambulatory Visit (INDEPENDENT_AMBULATORY_CARE_PROVIDER_SITE_OTHER): Payer: PRIVATE HEALTH INSURANCE | Admitting: *Deleted

## 2014-01-06 DIAGNOSIS — Z7901 Long term (current) use of anticoagulants: Secondary | ICD-10-CM

## 2014-01-06 DIAGNOSIS — I4891 Unspecified atrial fibrillation: Secondary | ICD-10-CM

## 2014-01-06 DIAGNOSIS — Z5181 Encounter for therapeutic drug level monitoring: Secondary | ICD-10-CM

## 2014-01-06 LAB — POCT INR: INR: 2.8

## 2014-01-06 MED ORDER — WARFARIN SODIUM 5 MG PO TABS: ORAL_TABLET | ORAL | Status: DC

## 2014-01-06 NOTE — Telephone Encounter (Signed)
Pt seen in clinic today for INR check, refill sent to pharmacy at that time.

## 2014-01-08 ENCOUNTER — Other Ambulatory Visit: Payer: Self-pay | Admitting: Cardiovascular Disease

## 2014-03-01 ENCOUNTER — Encounter: Payer: Self-pay | Admitting: Cardiology

## 2014-03-22 ENCOUNTER — Ambulatory Visit (INDEPENDENT_AMBULATORY_CARE_PROVIDER_SITE_OTHER): Payer: PRIVATE HEALTH INSURANCE | Admitting: *Deleted

## 2014-03-22 DIAGNOSIS — I4891 Unspecified atrial fibrillation: Secondary | ICD-10-CM

## 2014-03-22 DIAGNOSIS — Z5181 Encounter for therapeutic drug level monitoring: Secondary | ICD-10-CM

## 2014-03-22 DIAGNOSIS — Z7901 Long term (current) use of anticoagulants: Secondary | ICD-10-CM

## 2014-03-22 LAB — POCT INR: INR: 3

## 2014-04-11 NOTE — Progress Notes (Signed)
Patient ID: Timothy Cantu, male   DOB: [birth year], 64 y.o.   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 It was likely from ETOH. His EF had returned to near  normal with abstinance. He has chronic afib and is on coumadin. He has not had any bleeding problems. He continues to work at [employer]. He had a cath in 2005 after syncope and has no CAD. He has not had recurrent syncope since he stopped drinking. He had a follow up echo last year that demonstrated ejection fraction of 50% to 55%. Wall motion was normal; there were no regional wall motion abnormalities, mild MR, severely dilated LA, mildly to moderately dilated RA. Today, the patient denies chest pain, dyspnea, orthopnea, PND, pedal edema or syncope. The patient describes NYHA Class 1 symptoms.   Med refills given today Last echo 2/15    Study Conclusions  - Left ventricle: The cavity size was mildly dilated. Wall thickness was increased in a pattern of mild LVH. Systolic function was mildly to moderately reduced. The estimated ejection fraction was in the range of 40% to 45%. - Mitral valve: Mild regurgitation. - Left atrium: The atrium was severely dilated. - Right atrium: The atrium was mildly dilated.   BP high this am did not take meds  Discussed how to take his coreg and capoten with him    ROS: Denies fever, malais, weight loss, blurry vision, decreased visual acuity, cough, sputum, SOB, hemoptysis, pleuritic pain, palpitaitons, heartburn, abdominal pain, melena, lower extremity edema, claudication, or rash.  All other systems reviewed and negative  General: Affect appropriate Healthy:  appears stated age HEENT: normal Neck supple with no adenopathy JVP normal no bruits no thyromegaly Lungs clear with no wheezing and good diaphragmatic motion Heart:  S1/S2 no murmur, no rub, gallop or click PMI normal Abdomen: benighn, BS positve, no tenderness, no AAA no bruit.  No HSM or HJR Distal pulses intact with no  bruits No edema Neuro non-focal Skin warm and dry No muscular weakness   Current Outpatient Prescriptions  Medication Sig Dispense Refill  . captopril (CAPOTEN) 25 MG tablet Take 1 tablet (25 mg total) by mouth 2 (two) times daily. 60 tablet 11  . carvedilol (COREG) 25 MG tablet Take 1 tablet (25 mg total) by mouth 2 (two) times daily with a meal. 60 tablet 11  . warfarin (COUMADIN) 5 MG tablet TAKE AS DIRECTED BY ANTICOAGULATION CLINIC 35 tablet 0   No current facility-administered medications for this visit.    Allergies  Review of patient's allergies indicates no known allergies.  Electrocardiogram:  afib rate 82  Poor R wave progression  10/12/13 afib rate 96  Nonspecific ST changes   04/12/14  afib nonspecific ST changes  No change  Rate 86   Assessment and Plan

## 2014-04-12 ENCOUNTER — Ambulatory Visit (INDEPENDENT_AMBULATORY_CARE_PROVIDER_SITE_OTHER): Payer: PRIVATE HEALTH INSURANCE | Admitting: *Deleted

## 2014-04-12 ENCOUNTER — Encounter: Payer: Self-pay | Admitting: Cardiovascular Disease

## 2014-04-12 ENCOUNTER — Ambulatory Visit (INDEPENDENT_AMBULATORY_CARE_PROVIDER_SITE_OTHER): Payer: PRIVATE HEALTH INSURANCE | Admitting: Cardiovascular Disease

## 2014-04-12 VITALS — BP 174/88 | HR 97 | Ht 72.0 in | Wt 271.1 lb

## 2014-04-12 DIAGNOSIS — Z5181 Encounter for therapeutic drug level monitoring: Secondary | ICD-10-CM

## 2014-04-12 DIAGNOSIS — Z7901 Long term (current) use of anticoagulants: Secondary | ICD-10-CM

## 2014-04-12 DIAGNOSIS — I420 Dilated cardiomyopathy: Secondary | ICD-10-CM

## 2014-04-12 DIAGNOSIS — I10 Essential (primary) hypertension: Secondary | ICD-10-CM

## 2014-04-12 DIAGNOSIS — I482 Chronic atrial fibrillation, unspecified: Secondary | ICD-10-CM

## 2014-04-12 DIAGNOSIS — I4891 Unspecified atrial fibrillation: Secondary | ICD-10-CM

## 2014-04-12 LAB — POCT INR: INR: 2.3

## 2014-04-12 NOTE — Assessment & Plan Note (Signed)
Last EF ok  Will monitor BP closer may need more ACE  F/u with me next available Discussed how to take meds

## 2014-04-12 NOTE — Patient Instructions (Signed)
Your physician recommends that you schedule a follow-up appointment in: NEXT  AVAILABLE WITH  DR NISHAN  Your physician recommends that you continue on your current medications as directed. Please refer to the Current Medication list given to you today. 

## 2014-04-12 NOTE — Assessment & Plan Note (Signed)
Discussed how to space meds out He indicates taking them  F/U next available may need more ACE

## 2014-04-12 NOTE — Addendum Note (Signed)
Addended by: Scherrie Bateman E on: 04/12/2014 08:31 AM   Modules accepted: Orders

## 2014-04-12 NOTE — Assessment & Plan Note (Signed)
Good rate control and anticoagulation Coumadin clinic appt today

## 2014-04-13 ENCOUNTER — Other Ambulatory Visit: Payer: Self-pay | Admitting: Cardiovascular Disease

## 2014-04-14 ENCOUNTER — Other Ambulatory Visit: Payer: Self-pay | Admitting: *Deleted

## 2014-04-14 MED ORDER — CAPTOPRIL 25 MG PO TABS: 25.0000 mg | ORAL_TABLET | Freq: Two times a day (BID) | ORAL | Status: DC

## 2014-04-14 MED ORDER — WARFARIN SODIUM 5 MG PO TABS: ORAL_TABLET | ORAL | Status: DC

## 2014-04-14 MED ORDER — CARVEDILOL 25 MG PO TABS: 25.0000 mg | ORAL_TABLET | Freq: Two times a day (BID) | ORAL | Status: DC

## 2014-04-14 NOTE — Telephone Encounter (Signed)
Patient requests a 90 day supply. Thanks, MI

## 2014-04-15 ENCOUNTER — Other Ambulatory Visit: Payer: Self-pay | Admitting: Cardiovascular Disease

## 2014-05-31 NOTE — Progress Notes (Signed)
Patient ID: Timothy Cantu, male   DOB: 05-15-1950, 64 y.o.   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 It was likely from ETOH. His EF had returned to near  normal with abstinance. He has chronic afib and is on coumadin. He has not had any bleeding problems. He continues to work at [employer]. He had a cath in 2005 after syncope and has no CAD. He has not had recurrent syncope since he stopped drinking. He had a follow up echo last year that demonstrated ejection fraction of 50% to 55%. Wall motion was normal; there were no regional wall motion abnormalities, mild MR, severely dilated LA, mildly to moderately dilated RA. Today, the patient denies chest pain, dyspnea, orthopnea, PND, pedal edema or syncope. The patient describes NYHA Class 1 symptoms.   Med refills given today Last echo 2/15    Study Conclusions  - Left ventricle: The cavity size was mildly dilated. Wall thickness was increased in a pattern of mild LVH. Systolic function was mildly to moderately reduced. The estimated ejection fraction was in the range of 40% to 45%. - Mitral valve: Mild regurgitation. - Left atrium: The atrium was severely dilated. - Right atrium: The atrium was mildly dilated.   04/14/14 BP was high  Home readings also high   ROS: Denies fever, malais, weight loss, blurry vision, decreased visual acuity, cough, sputum, SOB, hemoptysis, pleuritic pain, palpitaitons, heartburn, abdominal pain, melena, lower extremity edema, claudication, or rash.  All other systems reviewed and negative  General: Affect appropriate Healthy:  appears stated age HEENT: normal Neck supple with no adenopathy JVP normal no bruits no thyromegaly Lungs clear with no wheezing and good diaphragmatic motion Heart:  S1/S2 no murmur, no rub, gallop or click PMI normal Abdomen: benighn, BS positve, no tenderness, no AAA no bruit.  No HSM or HJR Distal pulses intact with no bruits No edema Neuro non-focal Skin warm and  dry No muscular weakness   Current Outpatient Prescriptions  Medication Sig Dispense Refill  . carvedilol (COREG) 25 MG tablet Take 1 tablet (25 mg total) by mouth 2 (two) times daily with a meal. 180 tablet 0  . warfarin (COUMADIN) 5 MG tablet Take as directed by Coumadin clinic 100 tablet 0  . losartan-hydrochlorothiazide (HYZAAR) 100-25 MG per tablet Take 1 tablet by mouth daily. 30 tablet 11   No current facility-administered medications for this visit.    Allergies  Review of patient's allergies indicates no known allergies.  Electrocardiogram:  afib rate 82  Poor R wave progression  10/12/13 afib rate 96  Nonspecific ST changes   04/12/14  afib nonspecific ST changes  No change  Rate 86   Assessment and Plan

## 2014-06-01 ENCOUNTER — Ambulatory Visit (INDEPENDENT_AMBULATORY_CARE_PROVIDER_SITE_OTHER): Payer: PRIVATE HEALTH INSURANCE

## 2014-06-01 ENCOUNTER — Ambulatory Visit (INDEPENDENT_AMBULATORY_CARE_PROVIDER_SITE_OTHER): Payer: PRIVATE HEALTH INSURANCE | Admitting: Cardiovascular Disease

## 2014-06-01 ENCOUNTER — Encounter: Payer: Self-pay | Admitting: Cardiovascular Disease

## 2014-06-01 VITALS — BP 148/92 | HR 87 | Ht 72.0 in | Wt 266.0 lb

## 2014-06-01 DIAGNOSIS — I4891 Unspecified atrial fibrillation: Secondary | ICD-10-CM | POA: Diagnosis not present

## 2014-06-01 DIAGNOSIS — Z5181 Encounter for therapeutic drug level monitoring: Secondary | ICD-10-CM

## 2014-06-01 DIAGNOSIS — I10 Essential (primary) hypertension: Secondary | ICD-10-CM

## 2014-06-01 DIAGNOSIS — I420 Dilated cardiomyopathy: Secondary | ICD-10-CM

## 2014-06-01 DIAGNOSIS — Z7901 Long term (current) use of anticoagulants: Secondary | ICD-10-CM

## 2014-06-01 DIAGNOSIS — I482 Chronic atrial fibrillation, unspecified: Secondary | ICD-10-CM

## 2014-06-01 DIAGNOSIS — Z79899 Other long term (current) drug therapy: Secondary | ICD-10-CM | POA: Diagnosis not present

## 2014-06-01 LAB — BASIC METABOLIC PANEL
BUN: 17 mg/dL (ref 6–23)
CHLORIDE: 106 meq/L (ref 96–112)
CO2: 27 mEq/L (ref 19–32)
Calcium: 9.4 mg/dL (ref 8.4–10.5)
Creatinine, Ser: 0.9 mg/dL (ref 0.40–1.50)
GFR: 90.26 mL/min (ref >60.00)
Glucose, Bld: 126 mg/dL — ABNORMAL HIGH (ref 70–99)
POTASSIUM: 4.1 meq/L (ref 3.5–5.1)
SODIUM: 137 meq/L (ref 135–145)

## 2014-06-01 LAB — POCT INR: INR: 2.5

## 2014-06-01 MED ORDER — LOSARTAN POTASSIUM-HCTZ 100-25 MG PO TABS: 1.0000 | ORAL_TABLET | Freq: Every day | ORAL | Status: DC

## 2014-06-01 NOTE — Assessment & Plan Note (Signed)
Good rate control and anticoagulation  

## 2014-06-01 NOTE — Assessment & Plan Note (Signed)
Euvolemic adjust meds for BP  Echo in a year

## 2014-06-01 NOTE — Patient Instructions (Signed)
Your physician recommends that you schedule a follow-up appointment in:  3 MONTHS WITH  DR [facility]  Your physician has recommended you make the following change in your medication:  STOP   CAPOTEN START  LOSARTAN  HCTZ  100/25  MG   Your physician recommends that you return for lab work in: BMET   TODAY  AND  4  WEEKS

## 2014-06-01 NOTE — Assessment & Plan Note (Signed)
Stop capoten add hyzaar BMET today f/u me next available

## 2014-06-15 ENCOUNTER — Encounter: Payer: Self-pay | Admitting: *Deleted

## 2014-06-29 ENCOUNTER — Ambulatory Visit (INDEPENDENT_AMBULATORY_CARE_PROVIDER_SITE_OTHER): Payer: PRIVATE HEALTH INSURANCE | Admitting: *Deleted

## 2014-06-29 ENCOUNTER — Other Ambulatory Visit (INDEPENDENT_AMBULATORY_CARE_PROVIDER_SITE_OTHER): Payer: PRIVATE HEALTH INSURANCE

## 2014-06-29 DIAGNOSIS — Z79899 Other long term (current) drug therapy: Secondary | ICD-10-CM | POA: Diagnosis not present

## 2014-06-29 DIAGNOSIS — I482 Chronic atrial fibrillation, unspecified: Secondary | ICD-10-CM

## 2014-06-29 DIAGNOSIS — Z7901 Long term (current) use of anticoagulants: Secondary | ICD-10-CM | POA: Diagnosis not present

## 2014-06-29 DIAGNOSIS — I4891 Unspecified atrial fibrillation: Secondary | ICD-10-CM

## 2014-06-29 DIAGNOSIS — Z5181 Encounter for therapeutic drug level monitoring: Secondary | ICD-10-CM | POA: Diagnosis not present

## 2014-06-29 LAB — BASIC METABOLIC PANEL
BUN: 22 mg/dL (ref 6–23)
CO2: 28 mEq/L (ref 19–32)
Calcium: 9.8 mg/dL (ref 8.4–10.5)
Chloride: 100 mEq/L (ref 96–112)
Creatinine, Ser: 0.99 mg/dL (ref 0.40–1.50)
GFR: 80.84 mL/min (ref >60.00)
GLUCOSE: 133 mg/dL — AB (ref 70–99)
POTASSIUM: 3.8 meq/L (ref 3.5–5.1)
Sodium: 134 mEq/L — ABNORMAL LOW (ref 135–145)

## 2014-06-29 LAB — POCT INR: INR: 2.8

## 2014-07-03 ENCOUNTER — Encounter: Payer: Self-pay | Admitting: *Deleted

## 2014-07-15 ENCOUNTER — Other Ambulatory Visit: Payer: Self-pay | Admitting: Cardiovascular Disease

## 2014-07-18 ENCOUNTER — Other Ambulatory Visit: Payer: Self-pay | Admitting: *Deleted

## 2014-08-14 ENCOUNTER — Ambulatory Visit (INDEPENDENT_AMBULATORY_CARE_PROVIDER_SITE_OTHER): Payer: PRIVATE HEALTH INSURANCE | Admitting: *Deleted

## 2014-08-14 DIAGNOSIS — I482 Chronic atrial fibrillation, unspecified: Secondary | ICD-10-CM

## 2014-08-14 DIAGNOSIS — I4891 Unspecified atrial fibrillation: Secondary | ICD-10-CM | POA: Diagnosis not present

## 2014-08-14 DIAGNOSIS — Z5181 Encounter for therapeutic drug level monitoring: Secondary | ICD-10-CM

## 2014-08-14 DIAGNOSIS — Z7901 Long term (current) use of anticoagulants: Secondary | ICD-10-CM | POA: Diagnosis not present

## 2014-08-14 LAB — POCT INR: INR: 3.4

## 2014-09-16 NOTE — Progress Notes (Signed)
Patient ID: Timothy Cantu, male   DOB: June 16, 1950, 64 y.o.   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 It was likely from ETOH. His EF had returned to near  normal with abstinance. He has chronic afib and is on coumadin. He has not had any bleeding problems. He continues to work at [employer]. He had a cath in 2005 after syncope and has no CAD. He has not had recurrent syncope since he stopped drinking. He had a follow up echo last year that demonstrated ejection fraction of 50% to 55%. Wall motion was normal; there were no regional wall motion abnormalities, mild MR, severely dilated LA, mildly to moderately dilated RA. Today, the patient denies chest pain, dyspnea, orthopnea, PND, pedal edema or syncope. The patient describes NYHA Class 1 symptoms.  Last visit hyzaar started and capotin d/c Lab Results  Component Value Date   CREATININE 0.99 06/29/2014    Last echo 2/15    Study Conclusions  - Left ventricle: The cavity size was mildly dilated. Wall thickness was increased in a pattern of mild LVH. Systolic function was mildly to moderately reduced. The estimated ejection fraction was in the range of 40% to 45%. - Mitral valve: Mild regurgitation. - Left atrium: The atrium was severely dilated. - Right atrium: The atrium was mildly dilated.   04/14/14 BP was high  Home readings also high   ROS: Denies fever, malais, weight loss, blurry vision, decreased visual acuity, cough, sputum, SOB, hemoptysis, pleuritic pain, palpitaitons, heartburn, abdominal pain, melena, lower extremity edema, claudication, or rash.  All other systems reviewed and negative  General: Affect appropriate Healthy:  appears stated age HEENT: normal Neck supple with no adenopathy JVP normal no bruits no thyromegaly Lungs clear with no wheezing and good diaphragmatic motion Heart:  S1/S2 no murmur, no rub, gallop or click PMI normal Abdomen: benighn, BS positve, no tenderness, no AAA no bruit.  No  HSM or HJR Distal pulses intact with no bruits No edema Neuro non-focal Skin warm and dry No muscular weakness   Current Outpatient Prescriptions  Medication Sig Dispense Refill  . carvedilol (COREG) 25 MG tablet TAKE ONE TABLET BY MOUTH TWICE DAILY WITH MEALS 180 tablet 0  . losartan-hydrochlorothiazide (HYZAAR) 100-25 MG per tablet Take 1 tablet by mouth daily. 30 tablet 11  . warfarin (COUMADIN) 5 MG tablet TAKE AS DIRECTED BY  COUMADIN  CLINIC 100 tablet 0   No current facility-administered medications for this visit.    Allergies  Review of patient's allergies indicates no known allergies.  Electrocardiogram:  afib rate 82  Poor R wave progression  10/12/13 afib rate 96  Nonspecific ST changes   04/12/14  afib nonspecific ST changes  No change  Rate 86   Assessment and Plan DCM: Euvolemic discussed low sodium diet continue current meds HTN: Well controlled.  Continue current medications and low sodium Dash type diet.   Afib: good rate control and anticoagulation INR check today refill called in   F/U with me in 6 months

## 2014-09-18 ENCOUNTER — Ambulatory Visit (INDEPENDENT_AMBULATORY_CARE_PROVIDER_SITE_OTHER): Payer: PRIVATE HEALTH INSURANCE | Admitting: Cardiovascular Disease

## 2014-09-18 ENCOUNTER — Ambulatory Visit (INDEPENDENT_AMBULATORY_CARE_PROVIDER_SITE_OTHER): Payer: PRIVATE HEALTH INSURANCE | Admitting: *Deleted

## 2014-09-18 ENCOUNTER — Encounter: Payer: Self-pay | Admitting: Cardiovascular Disease

## 2014-09-18 VITALS — BP 146/78 | HR 68 | Resp 11 | Ht 72.0 in | Wt 269.6 lb

## 2014-09-18 DIAGNOSIS — Z7901 Long term (current) use of anticoagulants: Secondary | ICD-10-CM | POA: Diagnosis not present

## 2014-09-18 DIAGNOSIS — I482 Chronic atrial fibrillation, unspecified: Secondary | ICD-10-CM

## 2014-09-18 DIAGNOSIS — I4891 Unspecified atrial fibrillation: Secondary | ICD-10-CM

## 2014-09-18 DIAGNOSIS — I2510 Atherosclerotic heart disease of native coronary artery without angina pectoris: Secondary | ICD-10-CM | POA: Diagnosis not present

## 2014-09-18 DIAGNOSIS — Z5181 Encounter for therapeutic drug level monitoring: Secondary | ICD-10-CM

## 2014-09-18 DIAGNOSIS — I2583 Coronary atherosclerosis due to lipid rich plaque: Principal | ICD-10-CM

## 2014-09-18 LAB — POCT INR: INR: 2.4

## 2014-09-18 MED ORDER — CARVEDILOL 25 MG PO TABS: 25.0000 mg | ORAL_TABLET | Freq: Two times a day (BID) | ORAL | Status: DC

## 2014-09-18 MED ORDER — LOSARTAN POTASSIUM-HCTZ 100-25 MG PO TABS: 1.0000 | ORAL_TABLET | Freq: Every day | ORAL | Status: DC

## 2014-09-18 MED ORDER — WARFARIN SODIUM 5 MG PO TABS: 5.0000 mg | ORAL_TABLET | ORAL | Status: DC

## 2014-09-18 NOTE — Patient Instructions (Signed)
Medication Instructions:  NO CHANGES  Labwork: NONE  Testing/Procedures: NONE  Follow-Up: Your physician wants you to follow-up in: 6 MONTHS WITH DR NISHAN You will receive a reminder letter in the mail two months in advance. If you don't receive a letter, please call our office to schedule the follow-up appointment.  Any Other Special Instructions Will Be Listed Below (If Applicable).   

## 2014-10-24 ENCOUNTER — Ambulatory Visit (INDEPENDENT_AMBULATORY_CARE_PROVIDER_SITE_OTHER): Payer: PRIVATE HEALTH INSURANCE | Admitting: Pharmacist Clinician (PhC)/ Clinical Pharmacy Specialist

## 2014-10-24 DIAGNOSIS — I4891 Unspecified atrial fibrillation: Secondary | ICD-10-CM | POA: Diagnosis not present

## 2014-10-24 DIAGNOSIS — I482 Chronic atrial fibrillation, unspecified: Secondary | ICD-10-CM

## 2014-10-24 DIAGNOSIS — Z5181 Encounter for therapeutic drug level monitoring: Secondary | ICD-10-CM | POA: Diagnosis not present

## 2014-10-24 DIAGNOSIS — Z7901 Long term (current) use of anticoagulants: Secondary | ICD-10-CM | POA: Diagnosis not present

## 2014-10-24 LAB — POCT INR: INR: 2.5

## 2014-12-05 ENCOUNTER — Ambulatory Visit (INDEPENDENT_AMBULATORY_CARE_PROVIDER_SITE_OTHER): Payer: PRIVATE HEALTH INSURANCE

## 2014-12-05 ENCOUNTER — Other Ambulatory Visit: Payer: Self-pay

## 2014-12-05 DIAGNOSIS — I4891 Unspecified atrial fibrillation: Secondary | ICD-10-CM

## 2014-12-05 DIAGNOSIS — I482 Chronic atrial fibrillation, unspecified: Secondary | ICD-10-CM

## 2014-12-05 DIAGNOSIS — Z5181 Encounter for therapeutic drug level monitoring: Secondary | ICD-10-CM | POA: Diagnosis not present

## 2014-12-05 DIAGNOSIS — Z7901 Long term (current) use of anticoagulants: Secondary | ICD-10-CM

## 2014-12-05 LAB — POCT INR: INR: 2.6

## 2014-12-05 MED ORDER — LOSARTAN POTASSIUM-HCTZ 100-25 MG PO TABS: 1.0000 | ORAL_TABLET | Freq: Every day | ORAL | Status: DC

## 2014-12-05 MED ORDER — CARVEDILOL 25 MG PO TABS: 25.0000 mg | ORAL_TABLET | Freq: Two times a day (BID) | ORAL | Status: DC

## 2014-12-05 MED ORDER — WARFARIN SODIUM 5 MG PO TABS: 5.0000 mg | ORAL_TABLET | ORAL | Status: DC

## 2014-12-05 NOTE — Telephone Encounter (Signed)
Pt see in Coumadin clinic requesting 90 day refills on all meds.  Warfarin refilled at visit and sent to [facility] on [address].  Please refill other cardiac meds per pt request.  Thanks.

## 2015-01-16 ENCOUNTER — Ambulatory Visit (INDEPENDENT_AMBULATORY_CARE_PROVIDER_SITE_OTHER): Payer: PRIVATE HEALTH INSURANCE

## 2015-01-16 DIAGNOSIS — I4891 Unspecified atrial fibrillation: Secondary | ICD-10-CM | POA: Diagnosis not present

## 2015-01-16 DIAGNOSIS — Z7901 Long term (current) use of anticoagulants: Secondary | ICD-10-CM

## 2015-01-16 DIAGNOSIS — I482 Chronic atrial fibrillation, unspecified: Secondary | ICD-10-CM

## 2015-01-16 DIAGNOSIS — Z5181 Encounter for therapeutic drug level monitoring: Secondary | ICD-10-CM

## 2015-01-16 LAB — POCT INR: INR: 3.2

## 2015-03-09 ENCOUNTER — Encounter: Payer: Self-pay | Admitting: Cardiovascular Disease

## 2015-03-16 ENCOUNTER — Ambulatory Visit (INDEPENDENT_AMBULATORY_CARE_PROVIDER_SITE_OTHER): Payer: PRIVATE HEALTH INSURANCE | Admitting: *Deleted

## 2015-03-16 DIAGNOSIS — I482 Chronic atrial fibrillation, unspecified: Secondary | ICD-10-CM

## 2015-03-16 DIAGNOSIS — Z7901 Long term (current) use of anticoagulants: Secondary | ICD-10-CM

## 2015-03-16 DIAGNOSIS — Z5181 Encounter for therapeutic drug level monitoring: Secondary | ICD-10-CM

## 2015-03-16 DIAGNOSIS — I4891 Unspecified atrial fibrillation: Secondary | ICD-10-CM | POA: Diagnosis not present

## 2015-03-16 LAB — POCT INR: INR: 2.6

## 2015-04-13 ENCOUNTER — Ambulatory Visit (INDEPENDENT_AMBULATORY_CARE_PROVIDER_SITE_OTHER): Payer: PRIVATE HEALTH INSURANCE | Admitting: *Deleted

## 2015-04-13 DIAGNOSIS — I482 Chronic atrial fibrillation, unspecified: Secondary | ICD-10-CM

## 2015-04-13 DIAGNOSIS — I4891 Unspecified atrial fibrillation: Secondary | ICD-10-CM | POA: Diagnosis not present

## 2015-04-13 DIAGNOSIS — Z7901 Long term (current) use of anticoagulants: Secondary | ICD-10-CM

## 2015-04-13 DIAGNOSIS — Z5181 Encounter for therapeutic drug level monitoring: Secondary | ICD-10-CM

## 2015-04-13 LAB — POCT INR: INR: 2.5

## 2015-04-13 MED ORDER — WARFARIN SODIUM 5 MG PO TABS: ORAL_TABLET | ORAL | Status: DC

## 2015-05-15 NOTE — Progress Notes (Addendum)
Patient ID: Timothy Cantu, male   DOB: Aug 15, 1950, 65 y.o.   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 It was likely from ETOH. His EF had returned to near  normal with abstinance. He has chronic afib and is on coumadin. He has not had any bleeding problems. He continues to work at [employer]. He had a cath in 2005 after syncope and has no CAD. He has not had recurrent syncope since he stopped drinking. He had a follow up echo last year that demonstrated ejection fraction of 50% to 55%. Wall motion was normal; there were no regional wall motion abnormalities, mild MR, severely dilated LA, mildly to moderately dilated RA. Today, the patient denies chest pain, dyspnea, orthopnea, PND, pedal edema or syncope. The patient describes NYHA Class 1 symptoms.  Last visit hyzaar started and capotin d/c Lab Results  Component Value Date   CREATININE 0.99 06/29/2014    Last echo 2/15    Study Conclusions  - Left ventricle: The cavity size was mildly dilated. Wall thickness was increased in a pattern of mild LVH. Systolic function was mildly to moderately reduced. The estimated ejection fraction was in the range of 40% to 45%. - Mitral valve: Mild regurgitation. - Left atrium: The atrium was severely dilated. - Right atrium: The atrium was mildly dilated.  BP better controlled on 3 drug Rx Weight up a bit discussed diet No bleeding issues INR excellent  ROS: Denies fever, malais, weight loss, blurry vision, decreased visual acuity, cough, sputum, SOB, hemoptysis, pleuritic pain, palpitaitons, heartburn, abdominal pain, melena, lower extremity edema, claudication, or rash.  All other systems reviewed and negative  General: Affect appropriate Healthy:  appears stated age HEENT: normal Neck supple with no adenopathy JVP normal no bruits no thyromegaly Lungs clear with no wheezing and good diaphragmatic motion Heart:  S1/S2 no murmur, no rub, gallop or click PMI normal Abdomen:  benighn, BS positve, no tenderness, no AAA no bruit.  No HSM or HJR Distal pulses intact with no bruits No edema Neuro non-focal Skin warm and dry No muscular weakness   Current Outpatient Prescriptions  Medication Sig Dispense Refill  . carvedilol (COREG) 25 MG tablet Take 1 tablet (25 mg total) by mouth 2 (two) times daily with a meal. 180 tablet 3  . losartan-hydrochlorothiazide (HYZAAR) 100-25 MG tablet Take 1 tablet by mouth daily. 90 tablet 3  . warfarin (COUMADIN) 5 MG tablet Take as directed by coumadin clinic 100 tablet 0   No current facility-administered medications for this visit.    Allergies  Review of patient's allergies indicates no known allergies.  Electrocardiogram:  afib rate 82  Poor R wave progression  10/12/13 afib rate 96  Nonspecific ST changes   04/12/14  afib nonspecific ST changes  No change  Rate 86   05/17/15  afib rate 98  PVC poor R wave progression   Assessment and Plan DCM: Euvolemic discussed low sodium diet continue current meds 90 day refills called in  HTN: Well controlled.  Continue current medications and low sodium Dash type diet.   Afib: good rate control and anticoagulation INR check today refill called in   INR 2.3 today on 6 week checks   F/U with me in 6 months   Timothy Cantu

## 2015-05-17 ENCOUNTER — Encounter: Payer: Self-pay | Admitting: Cardiovascular Disease

## 2015-05-17 ENCOUNTER — Ambulatory Visit (INDEPENDENT_AMBULATORY_CARE_PROVIDER_SITE_OTHER): Payer: PRIVATE HEALTH INSURANCE | Admitting: *Deleted

## 2015-05-17 ENCOUNTER — Ambulatory Visit (INDEPENDENT_AMBULATORY_CARE_PROVIDER_SITE_OTHER): Payer: PRIVATE HEALTH INSURANCE | Admitting: Cardiovascular Disease

## 2015-05-17 VITALS — BP 120/72 | HR 98 | Ht 74.0 in | Wt 274.0 lb

## 2015-05-17 DIAGNOSIS — I482 Chronic atrial fibrillation, unspecified: Secondary | ICD-10-CM

## 2015-05-17 DIAGNOSIS — Z5181 Encounter for therapeutic drug level monitoring: Secondary | ICD-10-CM

## 2015-05-17 DIAGNOSIS — Z7901 Long term (current) use of anticoagulants: Secondary | ICD-10-CM | POA: Diagnosis not present

## 2015-05-17 DIAGNOSIS — I4891 Unspecified atrial fibrillation: Secondary | ICD-10-CM

## 2015-05-17 LAB — POCT INR: INR: 2.3

## 2015-05-17 MED ORDER — CARVEDILOL 25 MG PO TABS: 25.0000 mg | ORAL_TABLET | Freq: Two times a day (BID) | ORAL | Status: DC

## 2015-05-17 MED ORDER — LOSARTAN POTASSIUM-HCTZ 100-25 MG PO TABS: 1.0000 | ORAL_TABLET | Freq: Every day | ORAL | Status: DC

## 2015-05-17 NOTE — Patient Instructions (Signed)

## 2015-06-01 ENCOUNTER — Other Ambulatory Visit: Payer: Self-pay | Admitting: Cardiovascular Disease

## 2015-06-28 ENCOUNTER — Ambulatory Visit (INDEPENDENT_AMBULATORY_CARE_PROVIDER_SITE_OTHER): Payer: PRIVATE HEALTH INSURANCE | Admitting: *Deleted

## 2015-06-28 DIAGNOSIS — I482 Chronic atrial fibrillation, unspecified: Secondary | ICD-10-CM

## 2015-06-28 DIAGNOSIS — Z7901 Long term (current) use of anticoagulants: Secondary | ICD-10-CM

## 2015-06-28 DIAGNOSIS — Z5181 Encounter for therapeutic drug level monitoring: Secondary | ICD-10-CM | POA: Diagnosis not present

## 2015-06-28 DIAGNOSIS — I4891 Unspecified atrial fibrillation: Secondary | ICD-10-CM

## 2015-06-28 LAB — POCT INR: INR: 2.2

## 2015-09-24 ENCOUNTER — Ambulatory Visit (INDEPENDENT_AMBULATORY_CARE_PROVIDER_SITE_OTHER): Payer: PRIVATE HEALTH INSURANCE | Admitting: Pharmacist

## 2015-09-24 DIAGNOSIS — I482 Chronic atrial fibrillation, unspecified: Secondary | ICD-10-CM

## 2015-09-24 DIAGNOSIS — Z5181 Encounter for therapeutic drug level monitoring: Secondary | ICD-10-CM | POA: Diagnosis not present

## 2015-09-24 DIAGNOSIS — Z7901 Long term (current) use of anticoagulants: Secondary | ICD-10-CM

## 2015-09-24 DIAGNOSIS — I4891 Unspecified atrial fibrillation: Secondary | ICD-10-CM

## 2015-09-24 LAB — POCT INR: INR: 3.1

## 2015-10-09 ENCOUNTER — Other Ambulatory Visit: Payer: Self-pay | Admitting: Family Medicine

## 2015-10-10 LAB — CMP12+LP+TP+TSH+6AC+PSA+CBC…
A/G RATIO: 1.9 (ref 1.2–2.2)
ALBUMIN: 4.6 g/dL (ref 3.6–4.8)
ALT: 26 IU/L (ref 0–44)
AST: 21 IU/L (ref 0–40)
Alkaline Phosphatase: 80 IU/L (ref 39–117)
BASOS ABS: 0 10*3/uL (ref 0.0–0.2)
BILIRUBIN TOTAL: 0.8 mg/dL (ref 0.0–1.2)
BUN/Creatinine Ratio: 18 (ref 10–24)
BUN: 22 mg/dL (ref 8–27)
Basos: 0 %
CHOLESTEROL TOTAL: 179 mg/dL (ref 100–199)
Calcium: 9.5 mg/dL (ref 8.6–10.2)
Chloride: 97 mmol/L (ref 96–106)
Chol/HDL Ratio: 3.7 ratio units (ref 0.0–5.0)
Creatinine, Ser: 1.23 mg/dL (ref 0.76–1.27)
EOS (ABSOLUTE): 0.2 10*3/uL (ref 0.0–0.4)
Eos: 2 %
Estimated CHD Risk: 0.6 times avg. (ref 0.0–1.0)
FREE THYROXINE INDEX: 1.9 (ref 1.2–4.9)
GFR calc non Af Amer: 61 mL/min/{1.73_m2} (ref >59)
GFR, EST AFRICAN AMERICAN: 71 mL/min/{1.73_m2} (ref >59)
GGT: 25 IU/L (ref 0–65)
GLOBULIN, TOTAL: 2.4 g/dL (ref 1.5–4.5)
GLUCOSE: 100 mg/dL — AB (ref 65–99)
HDL: 49 mg/dL (ref >39)
Hematocrit: 47.4 % (ref 37.5–51.0)
Hemoglobin: 15.5 g/dL (ref 12.6–17.7)
IMMATURE GRANS (ABS): 0.1 10*3/uL (ref 0.0–0.1)
IRON: 150 ug/dL (ref 38–169)
Immature Granulocytes: 1 %
LDH: 214 IU/L (ref 121–224)
LDL Calculated: 98 mg/dL (ref 0–99)
LYMPHS: 34 %
Lymphocytes Absolute: 2.9 10*3/uL (ref 0.7–3.1)
MCH: 30.6 pg (ref 26.6–33.0)
MCHC: 32.7 g/dL (ref 31.5–35.7)
MCV: 94 fL (ref 79–97)
MONOCYTES: 9 %
MONOS ABS: 0.8 10*3/uL (ref 0.1–0.9)
NEUTROS ABS: 4.6 10*3/uL (ref 1.4–7.0)
Neutrophils: 54 %
PLATELETS: 186 10*3/uL (ref 150–379)
Phosphorus: 2.9 mg/dL (ref 2.5–4.5)
Potassium: 4.2 mmol/L (ref 3.5–5.2)
Prostate Specific Ag, Serum: 0.5 ng/mL (ref 0.0–4.0)
RBC: 5.06 x10E6/uL (ref 4.14–5.80)
RDW: 13.7 % (ref 12.3–15.4)
Sodium: 139 mmol/L (ref 134–144)
T3 UPTAKE RATIO: 27 % (ref 24–39)
T4, Total: 7.1 ug/dL (ref 4.5–12.0)
TOTAL PROTEIN: 7 g/dL (ref 6.0–8.5)
TRIGLYCERIDES: 158 mg/dL — AB (ref 0–149)
TSH: 2.09 u[IU]/mL (ref 0.450–4.500)
Uric Acid: 7.4 mg/dL (ref 3.7–8.6)
VLDL CHOLESTEROL CAL: 32 mg/dL (ref 5–40)
WBC: 8.5 10*3/uL (ref 3.4–10.8)

## 2015-10-10 LAB — HGB A1C W/O EAG: HEMOGLOBIN A1C: 5.9 % — AB (ref 4.8–5.6)

## 2015-11-13 ENCOUNTER — Ambulatory Visit (INDEPENDENT_AMBULATORY_CARE_PROVIDER_SITE_OTHER): Payer: PRIVATE HEALTH INSURANCE | Admitting: *Deleted

## 2015-11-13 DIAGNOSIS — Z7901 Long term (current) use of anticoagulants: Secondary | ICD-10-CM | POA: Diagnosis not present

## 2015-11-13 DIAGNOSIS — Z5181 Encounter for therapeutic drug level monitoring: Secondary | ICD-10-CM

## 2015-11-13 DIAGNOSIS — I4891 Unspecified atrial fibrillation: Secondary | ICD-10-CM | POA: Diagnosis not present

## 2015-11-13 LAB — POCT INR: INR: 2.9

## 2015-11-20 ENCOUNTER — Encounter: Payer: Self-pay | Admitting: Cardiovascular Disease

## 2015-11-30 NOTE — Progress Notes (Signed)
Patient ID: Timothy JamesDusan Cantu, male   DOB: [birth year], 65 y.o.   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 It was likely from ETOH. His EF had returned to near  normal with abstinance. He has chronic afib and is on coumadin. He has not had any bleeding problems. He continues to work at [employer]. He had a cath in 2005 after syncope and has no CAD. He has not had recurrent syncope since he stopped drinking. He had a follow up echo last year that demonstrated ejection fraction of 50% to 55%. Wall motion was normal; there were no regional wall motion abnormalities, mild MR, severely dilated LA, mildly to moderately dilated RA. Today, the patient denies chest pain, dyspnea, orthopnea, PND, pedal edema or syncope. The patient describes NYHA Class 1 symptoms.  Last visit hyzaar started and capotin d/c Lab Results  Component Value Date   CREATININE 1.23 10/09/2015    Last echo 2/15    Study Conclusions  - Left ventricle: The cavity size was mildly dilated. Wall thickness was increased in a pattern of mild LVH. Systolic function was mildly to moderately reduced. The estimated ejection fraction was in the range of 40% to 45%. - Mitral valve: Mild regurgitation. - Left atrium: The atrium was severely dilated. - Right atrium: The atrium was mildly dilated.  BP better controlled on 3 drug Rx Weight stable  No bleeding issues INR excellent  Has 2 sons that [place] both sons have two children  ROS: Denies fever, malais, weight loss, blurry vision, decreased visual acuity, cough, sputum, SOB, hemoptysis, pleuritic pain, palpitaitons, heartburn, abdominal pain, melena, lower extremity edema, claudication, or rash.  All other systems reviewed and negative  BP 138/88 (BP Location: Right Arm, Patient Position: Sitting, Cuff Size: Large)   Pulse 98   Resp 18   Ht 6\' 2"  (1.88 m)   Wt 117.5 kg (259 lb)   SpO2 97%   BMI 33.25 kg/m   General: Affect appropriate Healthy:  appears  stated age HEENT: normal Neck supple with no adenopathy JVP normal no bruits no thyromegaly Lungs clear with no wheezing and good diaphragmatic motion Heart:  S1/S2 no murmur, no rub, gallop or click PMI normal Abdomen: benighn, BS positve, no tenderness, no AAA no bruit.  No HSM or HJR Distal pulses intact with no bruits No edema Neuro non-focal Skin warm and dry No muscular weakness   Current Outpatient Prescriptions  Medication Sig Dispense Refill  . carvedilol (COREG) 25 MG tablet Take 1 tablet (25 mg total) by mouth 2 (two) times daily with a meal. 180 tablet 3  . losartan-hydrochlorothiazide (HYZAAR) 100-25 MG tablet Take 1 tablet by mouth daily. 90 tablet 3  . warfarin (COUMADIN) 5 MG tablet TAKE AS DIRECTED BY  COUMADIN  CLINIC 100 tablet 1   No current facility-administered medications for this visit.     Allergies  Review of patient's allergies indicates no known allergies.  Electrocardiogram:  afib rate 82  Poor R wave progression  10/12/13 afib rate 96  Nonspecific ST changes   04/12/14  afib nonspecific ST changes  No change  Rate 86   05/17/15  afib rate 98  PVC poor R wave progression   Assessment and Plan DCM: Euvolemic discussed low sodium diet continue current meds 90 day refills called in  HTN: Well controlled.  Continue current medications and low sodium Dash type diet.   Afib: good rate control and anticoagulation INR check today  refill called in   INR 2.3 today on 6 week checks   F/U with me in 6 months   [name]

## 2015-12-04 ENCOUNTER — Ambulatory Visit (INDEPENDENT_AMBULATORY_CARE_PROVIDER_SITE_OTHER): Payer: PRIVATE HEALTH INSURANCE | Admitting: Cardiovascular Disease

## 2015-12-04 ENCOUNTER — Encounter: Payer: Self-pay | Admitting: Cardiovascular Disease

## 2015-12-04 ENCOUNTER — Encounter (INDEPENDENT_AMBULATORY_CARE_PROVIDER_SITE_OTHER): Payer: Self-pay

## 2015-12-04 VITALS — BP 138/88 | HR 98 | Resp 18 | Ht 74.0 in | Wt 259.0 lb

## 2015-12-04 DIAGNOSIS — B3324 Viral cardiomyopathy: Secondary | ICD-10-CM

## 2015-12-04 NOTE — Patient Instructions (Addendum)

## 2015-12-25 ENCOUNTER — Ambulatory Visit (INDEPENDENT_AMBULATORY_CARE_PROVIDER_SITE_OTHER): Payer: PRIVATE HEALTH INSURANCE | Admitting: *Deleted

## 2015-12-25 DIAGNOSIS — I4891 Unspecified atrial fibrillation: Secondary | ICD-10-CM

## 2015-12-25 DIAGNOSIS — Z5181 Encounter for therapeutic drug level monitoring: Secondary | ICD-10-CM

## 2015-12-25 DIAGNOSIS — Z7901 Long term (current) use of anticoagulants: Secondary | ICD-10-CM | POA: Diagnosis not present

## 2015-12-25 LAB — POCT INR: INR: 2.1

## 2015-12-25 MED ORDER — WARFARIN SODIUM 5 MG PO TABS: ORAL_TABLET | ORAL | 1 refills | Status: DC

## 2016-02-05 ENCOUNTER — Ambulatory Visit (INDEPENDENT_AMBULATORY_CARE_PROVIDER_SITE_OTHER): Payer: PRIVATE HEALTH INSURANCE

## 2016-02-05 DIAGNOSIS — Z7901 Long term (current) use of anticoagulants: Secondary | ICD-10-CM

## 2016-02-05 DIAGNOSIS — Z5181 Encounter for therapeutic drug level monitoring: Secondary | ICD-10-CM

## 2016-02-05 DIAGNOSIS — I4891 Unspecified atrial fibrillation: Secondary | ICD-10-CM

## 2016-02-05 LAB — POCT INR: INR: 2.5

## 2016-03-18 ENCOUNTER — Ambulatory Visit (INDEPENDENT_AMBULATORY_CARE_PROVIDER_SITE_OTHER): Payer: PRIVATE HEALTH INSURANCE | Admitting: *Deleted

## 2016-03-18 DIAGNOSIS — I4891 Unspecified atrial fibrillation: Secondary | ICD-10-CM | POA: Diagnosis not present

## 2016-03-18 DIAGNOSIS — Z5181 Encounter for therapeutic drug level monitoring: Secondary | ICD-10-CM

## 2016-03-18 DIAGNOSIS — Z7901 Long term (current) use of anticoagulants: Secondary | ICD-10-CM | POA: Diagnosis not present

## 2016-03-18 LAB — POCT INR: INR: 2.8

## 2016-04-29 ENCOUNTER — Ambulatory Visit (INDEPENDENT_AMBULATORY_CARE_PROVIDER_SITE_OTHER): Payer: PRIVATE HEALTH INSURANCE | Admitting: Pharmacist

## 2016-04-29 DIAGNOSIS — Z7901 Long term (current) use of anticoagulants: Secondary | ICD-10-CM | POA: Diagnosis not present

## 2016-04-29 DIAGNOSIS — I4891 Unspecified atrial fibrillation: Secondary | ICD-10-CM | POA: Diagnosis not present

## 2016-04-29 DIAGNOSIS — Z5181 Encounter for therapeutic drug level monitoring: Secondary | ICD-10-CM

## 2016-04-29 LAB — POCT INR: INR: 2.9

## 2016-07-15 ENCOUNTER — Telehealth: Payer: Self-pay | Admitting: Cardiovascular Disease

## 2016-07-15 NOTE — Telephone Encounter (Signed)
Error

## 2016-07-16 ENCOUNTER — Other Ambulatory Visit: Payer: Self-pay | Admitting: Cardiovascular Disease

## 2016-07-16 MED ORDER — LOSARTAN POTASSIUM-HCTZ 100-25 MG PO TABS: 1.0000 | ORAL_TABLET | Freq: Every day | ORAL | 0 refills | Status: DC

## 2016-07-16 MED ORDER — CARVEDILOL 25 MG PO TABS: 25.0000 mg | ORAL_TABLET | Freq: Two times a day (BID) | ORAL | 0 refills | Status: DC

## 2016-07-16 NOTE — Telephone Encounter (Signed)
°*  STAT* If patient is at the pharmacy, call can be transferred to refill team.   1. Which medications need to be refilled? (please list name of each medication and dose if known) Carvedilol 25 mg, Losartan-hydrochlorothiazide 100-25 mg, coumadin 5 mg  2. Which pharmacy/location (including street and city if local pharmacy) is medication to be sent to?[facility] [address], [place] - [address]  3. Do they need a 30 day or 90 day supply? 90  Patient has appt on 10-06-16 with Dr. Eden Emms

## 2016-07-21 ENCOUNTER — Ambulatory Visit (INDEPENDENT_AMBULATORY_CARE_PROVIDER_SITE_OTHER): Payer: PRIVATE HEALTH INSURANCE | Admitting: *Deleted

## 2016-07-21 DIAGNOSIS — Z5181 Encounter for therapeutic drug level monitoring: Secondary | ICD-10-CM

## 2016-07-21 DIAGNOSIS — I4891 Unspecified atrial fibrillation: Secondary | ICD-10-CM

## 2016-07-21 DIAGNOSIS — Z7901 Long term (current) use of anticoagulants: Secondary | ICD-10-CM | POA: Diagnosis not present

## 2016-07-21 LAB — POCT INR: INR: 1.9

## 2016-07-22 MED ORDER — WARFARIN SODIUM 5 MG PO TABS: ORAL_TABLET | ORAL | 0 refills | Status: DC

## 2016-09-26 ENCOUNTER — Ambulatory Visit: Payer: Self-pay | Admitting: *Deleted

## 2016-09-26 VITALS — BP 126/85 | HR 101 | Ht 74.0 in | Wt 264.0 lb

## 2016-09-26 DIAGNOSIS — Z0000 Encounter for general adult medical examination without abnormal findings: Secondary | ICD-10-CM

## 2016-09-26 DIAGNOSIS — Z7901 Long term (current) use of anticoagulants: Secondary | ICD-10-CM

## 2016-09-26 NOTE — Progress Notes (Signed)
[employer] insurance premium discount evaluation: Labs Drawn. [employer] ROI form signed. Tobacco Free Attestation form signed.  Forms placed in paper chart.  Hx AFib. On warfarin. Last INR check June 2018. Was on vacation for July appt. Scheduled to see cardiology end of August. PT/INR drawn with [facility] labs as last check was >8 weeks previous and was subtherapeutic with dose adjustment.   Okay to route results to cardiologist Dr. Eden Emms and PCP per pt.

## 2016-09-27 LAB — CMP12+LP+TP+TSH+6AC+PSA+CBC…
A/G RATIO: 2 (ref 1.2–2.2)
ALT: 34 IU/L (ref 0–44)
AST: 24 IU/L (ref 0–40)
Albumin: 4.9 g/dL — ABNORMAL HIGH (ref 3.6–4.8)
Alkaline Phosphatase: 93 IU/L (ref 39–117)
BASOS: 0 %
BUN/Creatinine Ratio: 24 (ref 10–24)
BUN: 27 mg/dL (ref 8–27)
Basophils Absolute: 0 10*3/uL (ref 0.0–0.2)
Bilirubin Total: 0.8 mg/dL (ref 0.0–1.2)
CHOL/HDL RATIO: 4 ratio (ref 0.0–5.0)
Calcium: 9.4 mg/dL (ref 8.6–10.2)
Chloride: 100 mmol/L (ref 96–106)
Cholesterol, Total: 202 mg/dL — ABNORMAL HIGH (ref 100–199)
Creatinine, Ser: 1.11 mg/dL (ref 0.76–1.27)
EOS (ABSOLUTE): 0.1 10*3/uL (ref 0.0–0.4)
Eos: 1 %
Estimated CHD Risk: 0.7 times avg. (ref 0.0–1.0)
FREE THYROXINE INDEX: 2.2 (ref 1.2–4.9)
GFR calc Af Amer: 80 mL/min/{1.73_m2} (ref >59)
GFR calc non Af Amer: 69 mL/min/{1.73_m2} (ref >59)
GGT: 29 IU/L (ref 0–65)
GLOBULIN, TOTAL: 2.5 g/dL (ref 1.5–4.5)
Glucose: 108 mg/dL — ABNORMAL HIGH (ref 65–99)
HDL: 51 mg/dL (ref >39)
Hematocrit: 49.4 % (ref 37.5–51.0)
Hemoglobin: 16.2 g/dL (ref 13.0–17.7)
IMMATURE GRANULOCYTES: 1 %
Immature Grans (Abs): 0.1 10*3/uL (ref 0.0–0.1)
Iron: 142 ug/dL (ref 38–169)
LDH: 197 IU/L (ref 121–224)
LDL Calculated: 117 mg/dL — ABNORMAL HIGH (ref 0–99)
LYMPHS ABS: 3.1 10*3/uL (ref 0.7–3.1)
Lymphs: 36 %
MCH: 29.9 pg (ref 26.6–33.0)
MCHC: 32.8 g/dL (ref 31.5–35.7)
MCV: 91 fL (ref 79–97)
MONOS ABS: 0.7 10*3/uL (ref 0.1–0.9)
Monocytes: 8 %
NEUTROS PCT: 54 %
Neutrophils Absolute: 4.6 10*3/uL (ref 1.4–7.0)
PHOSPHORUS: 3.1 mg/dL (ref 2.5–4.5)
PLATELETS: 172 10*3/uL (ref 150–379)
Potassium: 4.5 mmol/L (ref 3.5–5.2)
Prostate Specific Ag, Serum: 0.5 ng/mL (ref 0.0–4.0)
RBC: 5.41 x10E6/uL (ref 4.14–5.80)
RDW: 13.8 % (ref 12.3–15.4)
Sodium: 140 mmol/L (ref 134–144)
T3 Uptake Ratio: 26 % (ref 24–39)
T4 TOTAL: 8.3 ug/dL (ref 4.5–12.0)
TRIGLYCERIDES: 172 mg/dL — AB (ref 0–149)
TSH: 1.69 u[IU]/mL (ref 0.450–4.500)
Total Protein: 7.4 g/dL (ref 6.0–8.5)
Uric Acid: 8 mg/dL (ref 3.7–8.6)
VLDL Cholesterol Cal: 34 mg/dL (ref 5–40)
WBC: 8.5 10*3/uL (ref 3.4–10.8)

## 2016-09-27 LAB — PROTIME-INR
INR: 2.5 — AB (ref 0.8–1.2)
PROTHROMBIN TIME: 24.7 s — AB (ref 9.1–12.0)

## 2016-09-27 LAB — HGB A1C W/O EAG: HEMOGLOBIN A1C: 6.1 % — AB (ref 4.8–5.6)

## 2016-09-29 ENCOUNTER — Ambulatory Visit (INDEPENDENT_AMBULATORY_CARE_PROVIDER_SITE_OTHER): Payer: PRIVATE HEALTH INSURANCE | Admitting: Interventional Cardiology

## 2016-09-29 DIAGNOSIS — Z5181 Encounter for therapeutic drug level monitoring: Secondary | ICD-10-CM

## 2016-10-02 NOTE — Progress Notes (Signed)
Results reviewed with pt via friend translating per pt request.  PT/INR routed to cardiology and already reviewed with pt contacted earlier in week to continue current dosing schedule. Lipids elevated, worsening from previous. A1c also elevated, worse from previous. Diet and exercise recommendations to improve both lipids and A1c discussed. [place] of [place] for past [place] and reports less healthy foods and larger quantity/frequency of meals than he usually has at home. Results routed to pcp earlier in week. Copy provided to pt along with handout on cholesterol related food choices.

## 2016-10-02 NOTE — Progress Notes (Deleted)
Patient ID: Timothy Cantu, male   DOB: 21-Jul-1950, 66 y.o.   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 It was likely from ETOH. His EF had returned to near  normal with abstinance. He has chronic afib and is on coumadin. He has not had any bleeding problems. He continues to work at [employer]. He had a cath in 2005 after syncope and has no CAD. He has not had recurrent syncope since he stopped drinking. He had a follow up echo last year that demonstrated ejection fraction of 50% to 55%. Wall motion was normal; there were no regional wall motion abnormalities, mild MR, severely dilated LA, mildly to moderately dilated RA. Today, the patient denies chest pain, dyspnea, orthopnea, PND, pedal edema or syncope. The patient describes NYHA Class 1 symptoms.  Lab Results  Component Value Date   CREATININE 1.11 09/26/2016    Last echo 2/15    Study Conclusions  - Left ventricle: The cavity size was mildly dilated. Wall thickness was increased in a pattern of mild LVH. Systolic function was mildly to moderately reduced. The estimated ejection fraction was in the range of 40% to 45%. - Mitral valve: Mild regurgitation. - Left atrium: The atrium was severely dilated. - Right atrium: The atrium was mildly dilated.  BP better controlled on 3 drug Rx Weight stable  No bleeding issues INR excellent  Has 2 sons that live [place] both sons have two children  ROS: Denies fever, malais, weight loss, blurry vision, decreased visual acuity, cough, sputum, SOB, hemoptysis, pleuritic pain, palpitaitons, heartburn, abdominal pain, melena, lower extremity edema, claudication, or rash.  All other systems reviewed and negative  There were no vitals taken for this visit.  General: Affect appropriate Built [place] male HEENT: normal Neck supple with no adenopathy JVP normal no bruits no thyromegaly Lungs clear with no wheezing and good diaphragmatic motion Heart:  S1/S2 no murmur, no rub,  gallop or click PMI normal Abdomen: benighn, BS positve, no tenderness, no AAA no bruit.  No HSM or HJR Distal pulses intact with no bruits No edema Neuro non-focal Skin warm and dry No muscular weakness    Current Outpatient Prescriptions  Medication Sig Dispense Refill  . carvedilol (COREG) 25 MG tablet Take 1 tablet (25 mg total) by mouth 2 (two) times daily with a meal. 180 tablet 0  . losartan-hydrochlorothiazide (HYZAAR) 100-25 MG tablet Take 1 tablet by mouth daily. 90 tablet 0  . warfarin (COUMADIN) 5 MG tablet TAKE AS DIRECTED BY  COUMADIN  CLINIC 100 tablet 0   No current facility-administered medications for this visit.     Allergies  Patient has no known allergies.  Electrocardiogram:  afib rate 82  Poor R wave progression  10/12/13 afib rate 96  Nonspecific ST changes   04/12/14  afib nonspecific ST changes  No change  Rate 86   05/17/15  afib rate 98  PVC poor R wave progression   Assessment and Plan DCM: Euvolemic discussed low sodium diet continue current meds 90 day refills called in will update echo since it's been Over 3 years since EF assessed  HTN: Well controlled.  Continue current medications and low sodium Dash type diet.    Afib: good rate control and anticoagulation INR RX  on 6 week checks   F/U with me in 6 months   Timothy Cantu

## 2016-10-06 ENCOUNTER — Ambulatory Visit: Payer: PRIVATE HEALTH INSURANCE | Admitting: Cardiovascular Disease

## 2016-11-10 NOTE — Progress Notes (Signed)
Patient ID: Timothy Cantu, male   DOB: 29-Oct-1950, 66 y.o.   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 It was likely from ETOH. His EF had returned to near  normal with abstinance. He has chronic afib and is on coumadin. He has not had any bleeding problems. He continues to work at [employer]. He had a cath in 2005 after syncope and has no CAD. He has not had recurrent syncope since he stopped drinking. He had a follow up echo last year that demonstrated ejection fraction of 50% to 55%. Wall motion was normal; there were no regional wall motion abnormalities, mild MR, severely dilated LA, mildly to moderately dilated RA. Today, the patient denies chest pain, dyspnea, orthopnea, PND, pedal edema or syncope. The patient describes NYHA Class 1 symptoms.   Last echo 2/15    Study Conclusions  - Left ventricle: The cavity size was mildly dilated. Wall thickness was increased in a pattern of mild LVH. Systolic function was mildly to moderately reduced. The estimated ejection fraction was in the range of 40% to 45%. - Mitral valve: Mild regurgitation. - Left atrium: The atrium was severely dilated. - Right atrium: The atrium was mildly dilated.  BP better controlled on 3 drug Rx Weight stable  No bleeding issues INR excellent  Has 2 sons that live [place] both sons have two children one is playing Football for [employer] His wife of 44 years and interpreter with him today   ROS: Denies fever, malais, weight loss, blurry vision, decreased visual acuity, cough, sputum, SOB, hemoptysis, pleuritic pain, palpitaitons, heartburn, abdominal pain, melena, lower extremity edema, claudication, or rash.  All other systems reviewed and negative  BP 132/78   Pulse 92   Ht 6\' 2"  (1.88 m)   Wt 274 lb 12.8 oz (124.6 kg)   SpO2 96%   BMI 35.28 kg/m   General: Affect appropriate Healthy:  appears stated age HEENT: normal Neck supple with no adenopathy JVP normal no bruits no  thyromegaly Lungs clear with no wheezing and good diaphragmatic motion Heart:  S1/S2 no murmur, no rub, gallop or click PMI normal Abdomen: benighn, BS positve, no tenderness, no AAA no bruit.  No HSM or HJR Distal pulses intact with no bruits No edema Neuro non-focal Skin warm and dry No muscular weakness    Current Outpatient Prescriptions  Medication Sig Dispense Refill  . carvedilol (COREG) 25 MG tablet Take 1 tablet (25 mg total) by mouth 2 (two) times daily with a meal. 180 tablet 0  . losartan-hydrochlorothiazide (HYZAAR) 100-25 MG tablet Take 1 tablet by mouth daily. 90 tablet 0  . warfarin (COUMADIN) 5 MG tablet TAKE AS DIRECTED BY  COUMADIN  CLINIC 100 tablet 0   No current facility-administered medications for this visit.     Allergies  Patient has no known allergies.  Electrocardiogram:  afib rate 82  Poor R wave progression  10/12/13 afib rate 96  Nonspecific ST changes   04/12/14  afib nonspecific ST changes  No change  Rate 86   05/17/15  afib rate 98  PVC poor R wave progression 11/12/16 afib poor R wave progression no changes   Assessment and Plan DCM: Euvolemic discussed low sodium diet continue current meds 90 day refills called in  Consider update echo next visit   HTN: Well controlled.  Continue current medications and low sodium Dash type diet.     Afib: good rate control and anticoagulation INR check today  refill called in   INR 2.3 today on 6 week checks   F/U with me in 6 months   [name]

## 2016-11-12 ENCOUNTER — Encounter: Payer: Self-pay | Admitting: Cardiovascular Disease

## 2016-11-12 ENCOUNTER — Encounter (INDEPENDENT_AMBULATORY_CARE_PROVIDER_SITE_OTHER): Payer: Self-pay

## 2016-11-12 ENCOUNTER — Ambulatory Visit (INDEPENDENT_AMBULATORY_CARE_PROVIDER_SITE_OTHER): Payer: PRIVATE HEALTH INSURANCE | Admitting: *Deleted

## 2016-11-12 ENCOUNTER — Ambulatory Visit (INDEPENDENT_AMBULATORY_CARE_PROVIDER_SITE_OTHER): Payer: PRIVATE HEALTH INSURANCE | Admitting: Cardiovascular Disease

## 2016-11-12 VITALS — BP 132/78 | HR 92 | Ht 74.0 in | Wt 274.8 lb

## 2016-11-12 DIAGNOSIS — I4891 Unspecified atrial fibrillation: Secondary | ICD-10-CM

## 2016-11-12 DIAGNOSIS — I10 Essential (primary) hypertension: Secondary | ICD-10-CM | POA: Diagnosis not present

## 2016-11-12 DIAGNOSIS — Z7901 Long term (current) use of anticoagulants: Secondary | ICD-10-CM

## 2016-11-12 DIAGNOSIS — Z5181 Encounter for therapeutic drug level monitoring: Secondary | ICD-10-CM

## 2016-11-12 DIAGNOSIS — I420 Dilated cardiomyopathy: Secondary | ICD-10-CM | POA: Diagnosis not present

## 2016-11-12 LAB — POCT INR: INR: 2

## 2016-11-12 MED ORDER — CARVEDILOL 25 MG PO TABS: 25.0000 mg | ORAL_TABLET | Freq: Two times a day (BID) | ORAL | 3 refills | Status: DC

## 2016-11-12 MED ORDER — LOSARTAN POTASSIUM-HCTZ 100-25 MG PO TABS: 1.0000 | ORAL_TABLET | Freq: Every day | ORAL | 3 refills | Status: DC

## 2016-11-12 NOTE — Patient Instructions (Signed)

## 2016-11-12 NOTE — Addendum Note (Signed)
Addended by: Virl Axe, Sabrin Dunlevy L on: 11/12/2016 08:30 AM   Modules accepted: Orders

## 2016-12-24 ENCOUNTER — Ambulatory Visit (INDEPENDENT_AMBULATORY_CARE_PROVIDER_SITE_OTHER): Payer: PRIVATE HEALTH INSURANCE | Admitting: *Deleted

## 2016-12-24 DIAGNOSIS — Z7901 Long term (current) use of anticoagulants: Secondary | ICD-10-CM

## 2016-12-24 DIAGNOSIS — Z5181 Encounter for therapeutic drug level monitoring: Secondary | ICD-10-CM

## 2016-12-24 DIAGNOSIS — I4891 Unspecified atrial fibrillation: Secondary | ICD-10-CM

## 2016-12-24 LAB — POCT INR: INR: 2.3

## 2017-02-04 ENCOUNTER — Ambulatory Visit (INDEPENDENT_AMBULATORY_CARE_PROVIDER_SITE_OTHER): Payer: PRIVATE HEALTH INSURANCE | Admitting: Pharmacist

## 2017-02-04 DIAGNOSIS — Z7901 Long term (current) use of anticoagulants: Secondary | ICD-10-CM

## 2017-02-04 DIAGNOSIS — I4891 Unspecified atrial fibrillation: Secondary | ICD-10-CM

## 2017-02-04 DIAGNOSIS — Z5181 Encounter for therapeutic drug level monitoring: Secondary | ICD-10-CM

## 2017-02-04 LAB — POCT INR: INR: 2.5

## 2017-02-04 NOTE — Patient Instructions (Signed)
Description   Continue taking 1 tablet every day except 1/2 tablet only on Mondays.  Recheck INR in 6 weeks.      

## 2017-03-10 ENCOUNTER — Other Ambulatory Visit: Payer: Self-pay | Admitting: Cardiovascular Disease

## 2017-03-11 ENCOUNTER — Telehealth: Payer: Self-pay

## 2017-03-11 MED ORDER — LOSARTAN POTASSIUM 100 MG PO TABS: 100.0000 mg | ORAL_TABLET | Freq: Every day | ORAL | 3 refills | Status: DC

## 2017-03-11 MED ORDER — HYDROCHLOROTHIAZIDE 25 MG PO TABS: 25.0000 mg | ORAL_TABLET | Freq: Every day | ORAL | 3 refills | Status: DC

## 2017-03-11 NOTE — Telephone Encounter (Signed)
[facility] sent fax about losartan/HCT 100/25 mg by mouth daily. This medication is on back order. They would like it changed to separate orders.

## 2017-03-18 ENCOUNTER — Ambulatory Visit (INDEPENDENT_AMBULATORY_CARE_PROVIDER_SITE_OTHER): Payer: PRIVATE HEALTH INSURANCE | Admitting: *Deleted

## 2017-03-18 ENCOUNTER — Encounter (INDEPENDENT_AMBULATORY_CARE_PROVIDER_SITE_OTHER): Payer: Self-pay

## 2017-03-18 DIAGNOSIS — I4891 Unspecified atrial fibrillation: Secondary | ICD-10-CM | POA: Diagnosis not present

## 2017-03-18 DIAGNOSIS — Z5181 Encounter for therapeutic drug level monitoring: Secondary | ICD-10-CM | POA: Diagnosis not present

## 2017-03-18 DIAGNOSIS — Z7901 Long term (current) use of anticoagulants: Secondary | ICD-10-CM

## 2017-03-18 LAB — POCT INR: INR: 2.9

## 2017-03-18 NOTE — Patient Instructions (Signed)
Description   Continue taking 1 tablet every day except 1/2 tablet only on Mondays.  Recheck INR in 6 weeks.

## 2017-04-29 ENCOUNTER — Ambulatory Visit (INDEPENDENT_AMBULATORY_CARE_PROVIDER_SITE_OTHER): Payer: PRIVATE HEALTH INSURANCE | Admitting: *Deleted

## 2017-04-29 DIAGNOSIS — Z5181 Encounter for therapeutic drug level monitoring: Secondary | ICD-10-CM

## 2017-04-29 DIAGNOSIS — Z7901 Long term (current) use of anticoagulants: Secondary | ICD-10-CM

## 2017-04-29 DIAGNOSIS — I4891 Unspecified atrial fibrillation: Secondary | ICD-10-CM

## 2017-04-29 LAB — POCT INR: INR: 2.4

## 2017-04-29 NOTE — Patient Instructions (Signed)
Description   Continue taking 1 tablet every day except 1/2 tablet only on Mondays.  Recheck INR in 6 weeks.      

## 2017-06-10 ENCOUNTER — Ambulatory Visit (INDEPENDENT_AMBULATORY_CARE_PROVIDER_SITE_OTHER): Payer: PRIVATE HEALTH INSURANCE | Admitting: *Deleted

## 2017-06-10 DIAGNOSIS — Z5181 Encounter for therapeutic drug level monitoring: Secondary | ICD-10-CM

## 2017-06-10 DIAGNOSIS — I4891 Unspecified atrial fibrillation: Secondary | ICD-10-CM | POA: Diagnosis not present

## 2017-06-10 DIAGNOSIS — Z7901 Long term (current) use of anticoagulants: Secondary | ICD-10-CM

## 2017-06-10 LAB — POCT INR: INR: 2

## 2017-06-10 MED ORDER — WARFARIN SODIUM 5 MG PO TABS: ORAL_TABLET | ORAL | 0 refills | Status: DC

## 2017-06-10 NOTE — Patient Instructions (Signed)
Description   Continue taking 1 tablet every day except 1/2 tablet only on Mondays.  Recheck INR in 6 weeks. Call us with any medication changes or concerns # [phone] [facility]     

## 2017-07-10 ENCOUNTER — Ambulatory Visit (INDEPENDENT_AMBULATORY_CARE_PROVIDER_SITE_OTHER): Payer: PRIVATE HEALTH INSURANCE | Admitting: Pharmacist

## 2017-07-10 DIAGNOSIS — I4891 Unspecified atrial fibrillation: Secondary | ICD-10-CM | POA: Diagnosis not present

## 2017-07-10 DIAGNOSIS — Z7901 Long term (current) use of anticoagulants: Secondary | ICD-10-CM | POA: Diagnosis not present

## 2017-07-10 DIAGNOSIS — Z5181 Encounter for therapeutic drug level monitoring: Secondary | ICD-10-CM | POA: Diagnosis not present

## 2017-07-10 LAB — POCT INR: INR: 1.9 — AB (ref 2.0–3.0)

## 2017-07-10 NOTE — Patient Instructions (Signed)
Description   Take 1.5 tablets today then continue taking 1 tablet every day except 1/2 tablet only on Mondays.  Recheck INR in 5 weeks. Call us with any medication changes or concerns # [phone] [facility]

## 2017-08-31 ENCOUNTER — Emergency Department (HOSPITAL_COMMUNITY): Payer: No Typology Code available for payment source

## 2017-08-31 ENCOUNTER — Other Ambulatory Visit: Payer: Self-pay

## 2017-08-31 ENCOUNTER — Encounter (HOSPITAL_COMMUNITY): Payer: Self-pay

## 2017-08-31 ENCOUNTER — Observation Stay (HOSPITAL_COMMUNITY): Payer: No Typology Code available for payment source

## 2017-08-31 ENCOUNTER — Observation Stay (HOSPITAL_COMMUNITY)
Admission: EM | Admit: 2017-08-31 | Discharge: 2017-09-01 | Disposition: A | Discharge status: Home / Self Care | Payer: No Typology Code available for payment source | Attending: Family Medicine | Admitting: Family Medicine

## 2017-08-31 DIAGNOSIS — Z87891 Personal history of nicotine dependence: Secondary | ICD-10-CM | POA: Diagnosis not present

## 2017-08-31 DIAGNOSIS — I509 Heart failure, unspecified: Secondary | ICD-10-CM | POA: Diagnosis not present

## 2017-08-31 DIAGNOSIS — J189 Pneumonia, unspecified organism: Secondary | ICD-10-CM | POA: Diagnosis present

## 2017-08-31 DIAGNOSIS — Z79899 Other long term (current) drug therapy: Secondary | ICD-10-CM | POA: Diagnosis not present

## 2017-08-31 DIAGNOSIS — I739 Peripheral vascular disease, unspecified: Secondary | ICD-10-CM | POA: Insufficient documentation

## 2017-08-31 DIAGNOSIS — J181 Lobar pneumonia, unspecified organism: Principal | ICD-10-CM | POA: Insufficient documentation

## 2017-08-31 DIAGNOSIS — I4891 Unspecified atrial fibrillation: Secondary | ICD-10-CM | POA: Diagnosis not present

## 2017-08-31 DIAGNOSIS — K219 Gastro-esophageal reflux disease without esophagitis: Secondary | ICD-10-CM | POA: Insufficient documentation

## 2017-08-31 DIAGNOSIS — I110 Hypertensive heart disease with heart failure: Secondary | ICD-10-CM | POA: Insufficient documentation

## 2017-08-31 DIAGNOSIS — I10 Essential (primary) hypertension: Secondary | ICD-10-CM | POA: Diagnosis not present

## 2017-08-31 DIAGNOSIS — R55 Syncope and collapse: Secondary | ICD-10-CM | POA: Diagnosis not present

## 2017-08-31 DIAGNOSIS — Z8249 Family history of ischemic heart disease and other diseases of the circulatory system: Secondary | ICD-10-CM | POA: Diagnosis not present

## 2017-08-31 DIAGNOSIS — Z7901 Long term (current) use of anticoagulants: Secondary | ICD-10-CM | POA: Insufficient documentation

## 2017-08-31 DIAGNOSIS — Z9889 Other specified postprocedural states: Secondary | ICD-10-CM | POA: Insufficient documentation

## 2017-08-31 DIAGNOSIS — R599 Enlarged lymph nodes, unspecified: Secondary | ICD-10-CM | POA: Insufficient documentation

## 2017-08-31 DIAGNOSIS — I420 Dilated cardiomyopathy: Secondary | ICD-10-CM | POA: Diagnosis not present

## 2017-08-31 HISTORY — DX: Pneumonia, unspecified organism: J18.9

## 2017-08-31 LAB — CBC
HEMATOCRIT: 46 % (ref 39.0–52.0)
Hemoglobin: 14.7 g/dL (ref 13.0–17.0)
MCH: 31.1 pg (ref 26.0–34.0)
MCHC: 32 g/dL (ref 30.0–36.0)
MCV: 97.5 fL (ref 78.0–100.0)
Platelets: 143 10*3/uL — ABNORMAL LOW (ref 150–400)
RBC: 4.72 MIL/uL (ref 4.22–5.81)
RDW: 13.1 % (ref 11.5–15.5)
WBC: 9.8 10*3/uL (ref 4.0–10.5)

## 2017-08-31 LAB — URINALYSIS, ROUTINE W REFLEX MICROSCOPIC
BACTERIA UA: NONE SEEN
BILIRUBIN URINE: NEGATIVE
GLUCOSE, UA: NEGATIVE mg/dL
Ketones, ur: NEGATIVE mg/dL
LEUKOCYTES UA: NEGATIVE
NITRITE: NEGATIVE
PROTEIN: NEGATIVE mg/dL
Specific Gravity, Urine: 1.038 — ABNORMAL HIGH (ref 1.005–1.030)
pH: 5 (ref 5.0–8.0)

## 2017-08-31 LAB — BASIC METABOLIC PANEL
ANION GAP: 8 (ref 5–15)
BUN: 22 mg/dL (ref 8–23)
CHLORIDE: 105 mmol/L (ref 98–111)
CO2: 25 mmol/L (ref 22–32)
Calcium: 8.9 mg/dL (ref 8.9–10.3)
Creatinine, Ser: 1.23 mg/dL (ref 0.61–1.24)
GFR, EST NON AFRICAN AMERICAN: 59 mL/min — AB (ref >60)
Glucose, Bld: 130 mg/dL — ABNORMAL HIGH (ref 70–99)
POTASSIUM: 4.6 mmol/L (ref 3.5–5.1)
SODIUM: 138 mmol/L (ref 135–145)

## 2017-08-31 LAB — CBG MONITORING, ED: GLUCOSE-CAPILLARY: 125 mg/dL — AB (ref 70–99)

## 2017-08-31 LAB — PROTIME-INR
INR: 1.43
PROTHROMBIN TIME: 17.3 s — AB (ref 11.4–15.2)

## 2017-08-31 LAB — I-STAT TROPONIN, ED: Troponin i, poc: 0 ng/mL (ref 0.00–0.08)

## 2017-08-31 LAB — STREP PNEUMONIAE URINARY ANTIGEN: STREP PNEUMO URINARY ANTIGEN: NEGATIVE

## 2017-08-31 LAB — BRAIN NATRIURETIC PEPTIDE: B NATRIURETIC PEPTIDE 5: 94.4 pg/mL (ref 0.0–100.0)

## 2017-08-31 MED ORDER — ACETAMINOPHEN 325 MG PO TABS: 650.0000 mg | ORAL_TABLET | Freq: Four times a day (QID) | ORAL | Status: DC | PRN

## 2017-08-31 MED ORDER — WARFARIN - PHARMACIST DOSING INPATIENT: Freq: Every day | Status: DC

## 2017-08-31 MED ORDER — VALACYCLOVIR HCL 500 MG PO TABS
500.0000 mg | ORAL_TABLET | Freq: Two times a day (BID) | ORAL | Status: DC
  Administered 2017-08-31 – 2017-09-01 (×2): 500 mg via ORAL
  Filled 2017-08-31 (×2): qty 1

## 2017-08-31 MED ORDER — SODIUM CHLORIDE 0.9 % IV SOLN
500.0000 mg | Freq: Once | INTRAVENOUS | Status: DC
  Filled 2017-08-31: qty 500

## 2017-08-31 MED ORDER — SODIUM CHLORIDE 0.9 % IV SOLN
500.0000 mg | INTRAVENOUS | Status: DC
  Administered 2017-08-31: 500 mg via INTRAVENOUS
  Filled 2017-08-31: qty 500

## 2017-08-31 MED ORDER — SODIUM CHLORIDE 0.9 % IV SOLN
1.0000 g | Freq: Once | INTRAVENOUS | Status: DC
  Filled 2017-08-31: qty 10

## 2017-08-31 MED ORDER — ACETAMINOPHEN 650 MG RE SUPP: 650.0000 mg | Freq: Four times a day (QID) | RECTAL | Status: DC | PRN

## 2017-08-31 MED ORDER — IOPAMIDOL (ISOVUE-370) INJECTION 76%
INTRAVENOUS | Status: AC
Start: 2017-08-31 — End: 2017-08-31
  Administered 2017-08-31: 72 mL
  Filled 2017-08-31: qty 100

## 2017-08-31 MED ORDER — SODIUM CHLORIDE 0.9 % IV SOLN
INTRAVENOUS | Status: DC
  Administered 2017-08-31 – 2017-09-01 (×3): via INTRAVENOUS

## 2017-08-31 MED ORDER — SODIUM CHLORIDE 0.9% FLUSH
3.0000 mL | Freq: Two times a day (BID) | INTRAVENOUS | Status: DC
  Administered 2017-08-31 – 2017-09-01 (×3): 3 mL via INTRAVENOUS

## 2017-08-31 MED ORDER — TRAZODONE HCL 50 MG PO TABS: 25.0000 mg | ORAL_TABLET | Freq: Every evening | ORAL | Status: DC | PRN

## 2017-08-31 MED ORDER — SENNOSIDES-DOCUSATE SODIUM 8.6-50 MG PO TABS: 1.0000 | ORAL_TABLET | Freq: Every evening | ORAL | Status: DC | PRN

## 2017-08-31 MED ORDER — CEFTRIAXONE SODIUM 1 G IJ SOLR
1.0000 g | INTRAMUSCULAR | Status: DC
  Administered 2017-08-31: 1 g via INTRAVENOUS
  Filled 2017-08-31: qty 10

## 2017-08-31 MED ORDER — ONDANSETRON HCL 4 MG PO TABS: 4.0000 mg | ORAL_TABLET | Freq: Four times a day (QID) | ORAL | Status: DC | PRN

## 2017-08-31 MED ORDER — CARVEDILOL 25 MG PO TABS
25.0000 mg | ORAL_TABLET | Freq: Two times a day (BID) | ORAL | Status: DC
  Administered 2017-09-01: 25 mg via ORAL
  Filled 2017-08-31: qty 1

## 2017-08-31 MED ORDER — CARVEDILOL 12.5 MG PO TABS: 25.0000 mg | ORAL_TABLET | Freq: Two times a day (BID) | ORAL | Status: DC

## 2017-08-31 MED ORDER — ONDANSETRON HCL 4 MG/2ML IJ SOLN: 4.0000 mg | Freq: Four times a day (QID) | INTRAMUSCULAR | Status: DC | PRN

## 2017-08-31 MED ORDER — WARFARIN SODIUM 5 MG PO TABS
5.0000 mg | ORAL_TABLET | Freq: Once | ORAL | Status: AC
  Administered 2017-08-31: 5 mg via ORAL
  Filled 2017-08-31 (×3): qty 1

## 2017-08-31 NOTE — Progress Notes (Signed)
EEG complete - results pending 

## 2017-08-31 NOTE — H&P (Signed)
History and Physical    Timothy Cantu UJW:[id number] DOB: Dec 10, 1950 DOA: 08/31/2017  PCP: [name], MD Patient coming from: home/home  Chief Complaint: syncope  HPI: Timothy Cantu is a 67 y.o. male with medical history significant  For cardiomyopathy, A. Fib on Coumadin,hypertension, presents to the emergency department with chief complaint of syncope. Initial evaluation includes CT NGO of the chest concerning for pneumonia. [facility] are asked to admit  Information is obtained from the patient and the chart noting that patient speaks no [place] and his son serves as Equities trader. Patient reports he has not felt well over the last several days. He states he was standing up conversing with a coworker and slowly he be and to feel like "my legs were going to give way". He verbalizes concern is coworkers assisted him to the floor. He had a witnessed loss of consciousness for about a minute. Also of bowel control. He states 3 days ago he returned from a 6 week trip to [place] The day prior to that he noted a "scratchy throat cough that was dry generalized weakness. Since then he's had a decreased oral intake. He denies headache dizziness chest pain palpitations shortness of breath. He denies abdominal pain nausea vomiting diarrhea constipation melena bright red blood per rectum. He denies any dysuria hematuria frequency or urgency. He denies fever chills orthopnea. He does report over the last 24 hours his cough has become intermittently productive with thick white sputum. EMS was called and reportedly had a systolic blood pressure of 88. He was provided with 500 mL of normal saline with good results.   ED Course: in the emergency department he's afebrile hemodynamically stable and not hypoxic. He is also provided with IV fluids and IV antibiotics.  Review of Systems: As per HPI otherwise all other systems reviewed and are negative.   Ambulatory Status:lives at home with his wife  ambulates independently is independent with ADLs employed full-time  Past Medical History:  Diagnosis Date  . Atrial fibrillation (HCC)   . CHF (congestive heart failure) (HCC)   . Dilated cardiomyopathy (HCC)   . ETOH abuse   . Long-term (current) use of anticoagulants   . Sciatica   . Syncope 02/2003   pt was out for 5-6 minutes    Past Surgical History:  Procedure Laterality Date  . CARDIAC CATHETERIZATION  02/28/2003   No critical coronary artery disease -- Severe left ventricular dysfunction with probable secondary mitral regurgitation -- Atrial fibrillation with controlled ventricular response  . STOMACH SURGERY     Stomach Surgery? type:  in [place]    Social History   Socioeconomic History  . Marital status: Married    Spouse name: Not on file  . Number of children: Not on file  . Years of education: Not on file  . Highest education level: Not on file  Occupational History  . Not on file  Social Needs  . Financial resource strain: Not on file  . Food insecurity:    Worry: Not on file    Inability: Not on file  . Transportation needs:    Medical: Not on file    Non-medical: Not on file  Tobacco Use  . Smoking status: Former Smoker    Packs/day: 20.00    Types: Cigarettes    Last attempt to quit: 03/04/1996    Years since quitting: 21.5  . Smokeless tobacco: Never Used  Substance and Sexual Activity  . Alcohol use: No  . Drug use: No  .  Sexual activity: Not on file  Lifestyle  . Physical activity:    Days per week: Not on file    Minutes per session: Not on file  . Stress: Not on file  Relationships  . Social connections:    Talks on phone: Not on file    Gets together: Not on file    Attends religious service: Not on file    Active member of club or organization: Not on file    Attends meetings of clubs or organizations: Not on file    Relationship status: Not on file  . Intimate partner violence:    Fear of current or ex partner: Not on file     Emotionally abused: Not on file    Physically abused: Not on file    Forced sexual activity: Not on file  Other Topics Concern  . Not on file  Social History Narrative  . Not on file    No Known Allergies  Family History  Problem Relation Age of Onset  . Heart disease Mother        Pt unsure of what kind  . Heart attack Neg Hx     Prior to Admission medications   Medication Sig Start Date End Date Taking? Authorizing Provider  carvedilol (COREG) 25 MG tablet Take 1 tablet (25 mg total) by mouth 2 (two) times daily with a meal. 11/12/16  Yes Wendall Stade, MD  DM-Doxylamine-Acetaminophen (NYQUIL COLD & FLU PO) Take 2 tablets by mouth as needed (cold symptoms).   Yes [provider]  ibuprofen (ADVIL,MOTRIN) 200 MG tablet Take 400 mg by mouth as needed for moderate pain.   Yes [provider]  losartan-hydrochlorothiazide (HYZAAR) 100-25 MG tablet Take 1 tablet by mouth daily. 12/05/14  Yes [provider]  Pseudoeph-Doxylamine-DM-APAP (DAYQUIL/NYQUIL COLD/FLU RELIEF PO) Take 2 tablets by mouth daily as needed (cold symptoms).   Yes [provider]  valACYclovir (VALTREX) 500 MG tablet Take 500 mg by mouth 2 (two) times daily. 08/27/17 09/06/17 Yes [provider]  warfarin (COUMADIN) 5 MG tablet Take As Directed by [facility] 06/10/17  Yes Wendall Stade, MD    Physical Exam: Vitals:   08/31/17 0945 08/31/17 1000 08/31/17 1015 08/31/17 1100  BP: 118/83 116/80 131/85 120/73  Pulse: 91 (!) 106 100 (!) 104  Resp: (!) 26 19 (!) 23 10  Temp:      TempSrc:      SpO2: 95% 97% 94% 94%  Weight:      Height:         General:  Appears calm and comfortable, well-nourished and fit Eyes:  PERRL, EOMI, normal lids, iris ENT:  grossly normal hearing, lips & tongue, mucous membranes of his mouth are pink but quite dry Neck:  no LAD, masses or thyromegaly Cardiovascular:  Irregular irregular I hear no murmur gallop or rub no lower extremity  edema Respiratory:  No increased work of breathing BS diminished I hear no crackles or wheezes. Abdomen:  soft, ntnd, NABS Skin:  no rash or induration seen on limited exam Musculoskeletal:  grossly normal tone BUE/BLE, good ROM, no bony abnormality Psychiatric:  grossly normal mood and affect, speech fluent and appropriate, AOx3 Neurologic:  CN 2-12 grossly intact, moves all extremities in coordinated fashion, sensation intact  Labs on Admission: I have personally reviewed following labs and imaging studies  CBC: Recent Labs  Lab 08/31/17 0839  WBC 9.8  HGB 14.7  HCT 46.0  MCV 97.5  PLT  143*   Basic Metabolic Panel: Recent Labs  Lab 08/31/17 0839  NA 138  K 4.6  CL 105  CO2 25  GLUCOSE 130*  BUN 22  CREATININE 1.23  CALCIUM 8.9   GFR: Estimated Creatinine Clearance: 77 mL/min (by C-G formula based on SCr of 1.23 mg/dL). Liver Function Tests: No results for input(s): AST, ALT, ALKPHOS, BILITOT, PROT, ALBUMIN in the last 168 hours. No results for input(s): LIPASE, AMYLASE in the last 168 hours. No results for input(s): AMMONIA in the last 168 hours. Coagulation Profile: Recent Labs  Lab 08/31/17 0839  INR 1.43   Cardiac Enzymes: No results for input(s): CKTOTAL, CKMB, CKMBINDEX, TROPONINI in the last 168 hours. BNP (last 3 results) No results for input(s): PROBNP in the last 8760 hours. HbA1C: No results for input(s): HGBA1C in the last 72 hours. CBG: Recent Labs  Lab 08/31/17 0832  GLUCAP 125*   Lipid Profile: No results for input(s): CHOL, HDL, LDLCALC, TRIG, CHOLHDL, LDLDIRECT in the last 72 hours. Thyroid Function Tests: No results for input(s): TSH, T4TOTAL, FREET4, T3FREE, THYROIDAB in the last 72 hours. Anemia Panel: No results for input(s): VITAMINB12, FOLATE, FERRITIN, TIBC, IRON, RETICCTPCT in the last 72 hours. Urine analysis: No results found for: COLORURINE, APPEARANCEUR, LABSPEC, PHURINE, GLUCOSEU, HGBUR, BILIRUBINUR, KETONESUR, PROTEINUR,  UROBILINOGEN, NITRITE, LEUKOCYTESUR  Creatinine Clearance: Estimated Creatinine Clearance: 77 mL/min (by C-G formula based on SCr of 1.23 mg/dL).  Sepsis Labs: @LABRCNTIP (procalcitonin:4,lacticidven:4) )No results found for this or any previous visit (from the past 240 hour(s)).   Radiological Exams on Admission: Dg Chest 2 View  Result Date: 08/31/2017 CLINICAL DATA:  Syncope. EXAM: CHEST - 2 VIEW COMPARISON:  None. FINDINGS: Mild cardiomegaly. Pulmonary vascular congestion. Slightly coarsened interstitial markings, greater on the right. No focal consolidation, pleural effusion, or pneumothorax. No acute osseous abnormality. IMPRESSION: Cardiomegaly and pulmonary vascular congestion without overt edema. Electronically Signed   By: Obie Dredge M.D.   On: 08/31/2017 09:06   Ct Head Wo Contrast  Result Date: 08/31/2017 CLINICAL DATA:  Syncope with questionable fall EXAM: CT HEAD WITHOUT CONTRAST TECHNIQUE: Contiguous axial images were obtained from the base of the skull through the vertex without intravenous contrast. COMPARISON:  None. FINDINGS: Brain: There is mild age related volume loss. There is no intracranial mass, hemorrhage, extra-axial fluid collection, or midline shift. There is mild patchy small vessel disease in the centra semiovale bilaterally. Elsewhere gray-white compartments appear normal. No acute infarct evident. Vascular: There is no hyperdense vessel evident. There is no appreciable vascular calcification. Skull: Bony calvarium appears intact. Sinuses/Orbits: There is mucosal thickening in each maxillary antrum. There are retention cysts in the superior and inferior maxillary antrum regions on the left. There is mucosal thickening in several ethmoid air cells. 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 No intraorbital lesions identified on either side. Other: Mastoid air cells are clear. IMPRESSION: 1. Age related volume loss with mild  patchy periventricular small vessel disease. No acute infarct evident. No mass or hemorrhage. 2.  Foci of paranasal sinus disease. 3.  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 Electronically Signed   By: Bretta Bang III M.D.   On: 08/31/2017 10:45   Ct Angio Chest Pe W/cm &/or Wo Cm  Result Date: 08/31/2017 CLINICAL DATA:  Chest pain and shortness of breath EXAM: CT ANGIOGRAPHY CHEST WITH CONTRAST TECHNIQUE: Multidetector CT imaging of the chest was performed using the standard protocol during bolus administration  of intravenous contrast. Multiplanar CT image reconstructions and MIPs were obtained to evaluate the vascular anatomy. CONTRAST:  72mL ISOVUE-370 IOPAMIDOL (ISOVUE-370) INJECTION 76% COMPARISON:  Chest radiograph August 31, 2017 FINDINGS: Cardiovascular: There is no appreciable pulmonary embolus. There is no thoracic aortic aneurysm or dissection. The contrast bolus in the aorta is somewhat less than optimal for assessment for dissection. Visualized great vessels appear unremarkable. There is no pericardial effusion or pericardial thickening evident. There is prominence of the main pulmonary outflow tract measuring 3.2 cm. Mediastinum/Nodes: Visualized thyroid appears normal. There is a hilar lymph node on the right measuring 2.6 x 1.8 cm. There is a subcarinal lymph node measuring 2.6 x 1.7 cm. There is a left hilar lymph node measuring 1.7 x 1.0 cm. There are several subcentimeter mediastinal lymph nodes as well. No esophageal lesions are evident. Lungs/Pleura: There are areas of patchy atelectasis in the lung bases. There is patchy opacity throughout much of the right upper lobe with tree on bud type appearance throughout much of the right upper lobe. Similar changes not seen elsewhere. No pleural effusion or pleural thickening evident. Upper Abdomen: There is mild reflux into the inferior vena cava. Visualized upper abdominal structures otherwise appear normal. Musculoskeletal:  There are foci of degenerative change in the mid to lower thoracic spine. No blastic or lytic bone lesions. No chest wall lesions evident. Review of the MIP images confirms the above findings. IMPRESSION: 1. No demonstrable pulmonary embolus. No thoracic aortic aneurysm or dissection. The contrast bolus in the aorta is somewhat less than optimal for potential dissection assessment. 2. Tree on bud type appearance throughout much of the right upper lobe consistent with pneumonia. Bibasilar atelectasis. 3. Areas of adenopathy as noted. Question etiology. Potential reactive adenopathy due to pneumonia is possible. 4. There is prominence of the main pulmonary outflow tract, a finding felt to be indicative of a degree of pulmonary arterial hypertension. 5. Slight reflux of contrast in the inferior vena cava. This is a finding that may indicate a degree of increase in right heart pressure. Electronically Signed   By: Bretta Bang III M.D.   On: 08/31/2017 10:53    EKG: Independently reviewed. Atrial fibrillation Borderline right axis deviation bipahsic t wave in lead III Otherwise no significant change   Assessment/Plan Principal Problem:   Syncope Active Problems:   CAP (community acquired pneumonia)   Congestive dilated cardiomyopathy (HCC)   ATRIAL FIBRILLATION   HTN (hypertension)   #1. Syncope. Likely related to decreased oral intake in the setting of community-acquired pneumonia and diuretics. Reportedly systolic blood pressure 88 per EMS. Provided with IV fluid resuscitation with good results. CT of the head without acute abnormality. CT of the chest concerning for pneumonia. No Metabolic derangements.patient with history A. Fib and DCM with an EF of 15%. No EtOH abuse in 20 years and echo last year reveals ejection fraction of 55%. Review indicates compliance with routine cardiology visit -admit to telemetry -orthostatic vital signs -Hold home diuretic -Continue gentle IV fluids -Monitor  closely -EEG as reportedly patient incontinent of bowel during syncope  2. Community-acquired pneumonia. CT of the chest obtained and concerning for pneumonia. patient is afebrile hemodynamically stable during ED visit after 500 mL of normal saline given by a mass. No tachycardia no tachypnea no hypoxia No evidence of PE. IV antibiotics per protocol initiated -Continue IV antibiotics -follow blood cultures -obtain sputum culture -Strep pneumo urine antigen  #3. Atrial fib. Rate controlled. Home medications include Coumadin. INR 1.9.EKG as  noted above. Patient denies chest pain palpitations. -Coumadin per pharmacy -Continue Coreg with parameters tomorrow -hold home hydrochlorothiazide and losartan  4. Dilated cardiomyopathy. Euvolemic. Medications includes HCTZ. Chart review indicates office visit with cardiology 10 months ago. At that time recommended follow-up in 6 months and the plan was to get an echo 10. Family reports patient followed up in February of this year and no echo required per cardiology. I see no documentation of that visit. -We'll hold off on echo for now -Medications adjusted as noted above -Monitor intake and output -Daily weights  5. Hypertension. Patient was hypotensive in the field. Provide 500 mL of normal saline. Blood pressure on the low end of normal during his stay in the emergency department. Home medications include hydrochlorothiazide, losartan, Coreg. -Continue Coreg for now with parameters -We hydrochlorothiazide and losartan for now -Monitor blood pressure -Home meds as indicated   DVT prophylaxis: coumadin  Code Status: full Family Communication: wife and son  Disposition Plan: home  Consults called: none  Admission status: obs    Toya Smothers M MD [facility]  If 7PM-7AM, please contact night-coverage [email or web address] Password [email or web address]  08/31/2017, 12:29 PM

## 2017-08-31 NOTE — ED Notes (Signed)
Pt sonCaroleen Hamman- [phone]

## 2017-08-31 NOTE — ED Notes (Signed)
Attempted to call report x 1  

## 2017-08-31 NOTE — ED Notes (Signed)
Pt aware of need for urine  

## 2017-08-31 NOTE — ED Triage Notes (Signed)
Pt from work with syncopal episode on EMS arrival pt blood pressure was 88/60 after of fluid pt blood pressure was 109/69. Pt A&Ox4 now.

## 2017-08-31 NOTE — ED Provider Notes (Signed)
[facility] EMERGENCY DEPARTMENT Provider Note   CSN: [id number] Arrival date & time: 08/31/17  0803     History   Chief Complaint Chief Complaint  Patient presents with  . Loss of Consciousness    HPI Timothy Cantu is a 67 y.o. male with a past medical history of A. fib, chronic anticoagulation with warfarin, CHF, dilated cardiomyopathy, alcohol abuse, who presents today for evaluation of a syncopal event.  He was seated at his workstation at work talking to a coworker when he reportedly passed out.  He was out for approximately 1 minute.  He had full loss of tone, and loss of bowel control.  He reports that recently he has not been feeling well and feeling weak.  He returned from [place] in [place] on Thursday.  On Wednesday he started noting a cough that was initially dry, however is now productive.  He has a remote smoking history.  No fevers, however he does report hot flashes and cold chills.  No abdominal pain, nausea vomiting or diarrhea.  Initially with EMS he was hypotensive, he was given 500 cc of fluid with significant improvement.  HPI  Past Medical History:  Diagnosis Date  . Atrial fibrillation (HCC)   . CHF (congestive heart failure) (HCC)   . Dilated cardiomyopathy (HCC)   . ETOH abuse   . Long-term (current) use of anticoagulants   . Sciatica   . Syncope 02/2003   pt was out for 5-6 minutes    Patient Active Problem List   Diagnosis Date Noted  . HTN (hypertension) 04/12/2014  . Encounter for therapeutic drug monitoring 03/16/2013  . Long term (current) use of anticoagulants 05/16/2010  . CHF 11/30/2008  . Congestive dilated cardiomyopathy (HCC) 04/27/2008  . ATRIAL FIBRILLATION 04/27/2008  . SCIATICA, ACUTE 04/27/2008    Past Surgical History:  Procedure Laterality Date  . CARDIAC CATHETERIZATION  02/28/2003   No critical coronary artery disease -- Severe left ventricular dysfunction with probable secondary mitral regurgitation --  Atrial fibrillation with controlled ventricular response  . STOMACH SURGERY     Stomach Surgery? type:  in [place]        Home Medications    Prior to Admission medications   Medication Sig Start Date End Date Taking? Authorizing Provider  carvedilol (COREG) 25 MG tablet Take 1 tablet (25 mg total) by mouth 2 (two) times daily with a meal. 11/12/16  Yes Wendall Stade, MD  DM-Doxylamine-Acetaminophen (NYQUIL COLD & FLU PO) Take 2 tablets by mouth as needed (cold symptoms).   Yes [provider]  ibuprofen (ADVIL,MOTRIN) 200 MG tablet Take 400 mg by mouth as needed for moderate pain.   Yes [provider]  losartan-hydrochlorothiazide (HYZAAR) 100-25 MG tablet Take 1 tablet by mouth daily. 12/05/14  Yes [provider]  Pseudoeph-Doxylamine-DM-APAP (DAYQUIL/NYQUIL COLD/FLU RELIEF PO) Take 2 tablets by mouth daily as needed (cold symptoms).   Yes [provider]  valACYclovir (VALTREX) 500 MG tablet Take 500 mg by mouth 2 (two) times daily. 08/27/17 09/06/17 Yes [provider]  warfarin (COUMADIN) 5 MG tablet Take As Directed by [facility] 06/10/17  Yes Wendall Stade, MD  hydrochlorothiazide (HYDRODIURIL) 25 MG tablet Take 1 tablet (25 mg total) by mouth daily. Patient not taking: Reported on 08/31/2017 03/11/17   Wendall Stade, MD  losartan (COZAAR) 100 MG tablet Take 1 tablet (100 mg total) by mouth daily. Patient not taking: Reported on 08/31/2017 03/11/17   Wendall Stade,  MD    Family History Family History  Problem Relation Age of Onset  . Heart disease Mother        Pt unsure of what kind  . Heart attack Neg Hx     Social History Social History   Tobacco Use  . Smoking status: Former Smoker    Packs/day: 20.00    Types: Cigarettes    Last attempt to quit: 03/04/1996    Years since quitting: 21.5  . Smokeless tobacco: Never Used  Substance Use Topics  . Alcohol use: No  . Drug use: No     Allergies   Patient has no  known allergies.   Review of Systems Review of Systems  Constitutional: Positive for chills and fatigue. Negative for fever.  HENT: Negative for congestion and sore throat.   Eyes: Negative for visual disturbance.  Respiratory: Positive for cough. Negative for chest tightness and shortness of breath.   Cardiovascular: Negative for chest pain, palpitations and leg swelling.  Gastrointestinal: Negative for abdominal pain, diarrhea, nausea and vomiting.  Musculoskeletal: Negative for neck pain.  Skin: Negative for rash.  Neurological: Positive for syncope. Negative for dizziness and headaches.  All other systems reviewed and are negative.    Physical Exam Updated Vital Signs BP 120/73   Pulse (!) 104   Temp 97.8 F (36.6 C) (Oral)   Resp 10   Ht 6\' 2"  (1.88 m)   Wt 110.2 kg (243 lb)   SpO2 94%   BMI 31.20 kg/m   Physical Exam  Constitutional: He appears well-developed and well-nourished.  HENT:  Head: Normocephalic and atraumatic.  Eyes: Conjunctivae are normal.  Neck: Neck supple.  Cardiovascular: Normal rate, regular rhythm, normal heart sounds and intact distal pulses.  No murmur heard. Pulmonary/Chest: Effort normal and breath sounds normal. No respiratory distress.  Abdominal: Soft. There is no tenderness.  Genitourinary:  Genitourinary Comments: There is a small area of induration on the right buttock with out surrounding induration, fluctuance, or erythema.    Musculoskeletal: He exhibits no edema.  Neurological: He is alert. He has normal strength. No cranial nerve deficit. GCS eye subscore is 4. GCS verbal subscore is 5. GCS motor subscore is 6.  Face is symmetrical, speech is not slurred.  He has 5/5 strength in all extremities.   Skin: Skin is warm and dry.  Psychiatric: He has a normal mood and affect.  Nursing note and vitals reviewed.    ED Treatments / Results  Labs (all labs ordered are listed, but only abnormal results are displayed) Labs Reviewed    BASIC METABOLIC PANEL - Abnormal; Notable for the following components:      Result Value   Glucose, Bld 130 (*)    GFR calc non Af Amer 59 (*)    All other components within normal limits  CBC - Abnormal; Notable for the following components:   Platelets 143 (*)    All other components within normal limits  PROTIME-INR - Abnormal; Notable for the following components:   Prothrombin Time 17.3 (*)    All other components within normal limits  CBG MONITORING, ED - Abnormal; Notable for the following components:   Glucose-Capillary 125 (*)    All other components within normal limits  CULTURE, BLOOD (ROUTINE X 2)  CULTURE, BLOOD (ROUTINE X 2)  BRAIN NATRIURETIC PEPTIDE  URINALYSIS, ROUTINE W REFLEX MICROSCOPIC  I-STAT TROPONIN, ED    EKG EKG Interpretation  Date/Time:  Monday August 31 2017 08:17:43 EDT Ventricular Rate:  90 PR Interval:    QRS Duration: 85 QT Interval:  365 QTC Calculation: 447 R Axis:   83 Text Interpretation:  Atrial fibrillation Borderline right axis deviation bipahsic t wave in lead III Otherwise no significant change Confirmed by Melene Plan [phone]) on 08/31/2017 8:28:09 AM   Radiology Dg Chest 2 View  Result Date: 08/31/2017 CLINICAL DATA:  Syncope. EXAM: CHEST - 2 VIEW COMPARISON:  None. FINDINGS: Mild cardiomegaly. Pulmonary vascular congestion. Slightly coarsened interstitial markings, greater on the right. No focal consolidation, pleural effusion, or pneumothorax. No acute osseous abnormality. IMPRESSION: Cardiomegaly and pulmonary vascular congestion without overt edema. Electronically Signed   By: Obie Dredge M.D.   On: 08/31/2017 09:06   Ct Head Wo Contrast  Result Date: 08/31/2017 CLINICAL DATA:  Syncope with questionable fall EXAM: CT HEAD WITHOUT CONTRAST TECHNIQUE: Contiguous axial images were obtained from the base of the skull through the vertex without intravenous contrast. COMPARISON:  None. FINDINGS: Brain: There is mild age related  volume loss. There is no intracranial mass, hemorrhage, extra-axial fluid collection, or midline shift. There is mild patchy small vessel disease in the centra semiovale bilaterally. Elsewhere gray-white compartments appear normal. No acute infarct evident. Vascular: There is no hyperdense vessel evident. There is no appreciable vascular calcification. Skull: Bony calvarium appears intact. Sinuses/Orbits: There is mucosal thickening in each maxillary antrum. There are retention cysts in the superior and inferior maxillary antrum regions on the left. There is mucosal thickening in several ethmoid air cells. 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 No intraorbital lesions identified on either side. Other: Mastoid air cells are clear. IMPRESSION: 1. Age related volume loss with mild patchy periventricular small vessel disease. No acute infarct evident. No mass or hemorrhage. 2.  Foci of paranasal sinus disease. 3.  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 Electronically Signed   By: Bretta Bang III M.D.   On: 08/31/2017 10:45   Ct Angio Chest Pe W/cm &/or Wo Cm  Result Date: 08/31/2017 CLINICAL DATA:  Chest pain and shortness of breath EXAM: CT ANGIOGRAPHY CHEST WITH CONTRAST TECHNIQUE: Multidetector CT imaging of the chest was performed using the standard protocol during bolus administration of intravenous contrast. Multiplanar CT image reconstructions and MIPs were obtained to evaluate the vascular anatomy. CONTRAST:  72mL ISOVUE-370 IOPAMIDOL (ISOVUE-370) INJECTION 76% COMPARISON:  Chest radiograph August 31, 2017 FINDINGS: Cardiovascular: There is no appreciable pulmonary embolus. There is no thoracic aortic aneurysm or dissection. The contrast bolus in the aorta is somewhat less than optimal for assessment for dissection. Visualized great vessels appear unremarkable. There is no pericardial effusion or pericardial thickening evident. There is  prominence of the main pulmonary outflow tract measuring 3.2 cm. Mediastinum/Nodes: Visualized thyroid appears normal. There is a hilar lymph node on the right measuring 2.6 x 1.8 cm. There is a subcarinal lymph node measuring 2.6 x 1.7 cm. There is a left hilar lymph node measuring 1.7 x 1.0 cm. There are several subcentimeter mediastinal lymph nodes as well. No esophageal lesions are evident. Lungs/Pleura: There are areas of patchy atelectasis in the lung bases. There is patchy opacity throughout much of the right upper lobe with tree on bud type appearance throughout much of the right upper lobe. Similar changes not seen elsewhere. No pleural effusion or pleural thickening evident. Upper Abdomen: There is mild reflux into the inferior vena cava. Visualized upper abdominal structures otherwise appear normal. Musculoskeletal: There are foci of degenerative  change in the mid to lower thoracic spine. No blastic or lytic bone lesions. No chest wall lesions evident. Review of the MIP images confirms the above findings. IMPRESSION: 1. No demonstrable pulmonary embolus. No thoracic aortic aneurysm or dissection. The contrast bolus in the aorta is somewhat less than optimal for potential dissection assessment. 2. Tree on bud type appearance throughout much of the right upper lobe consistent with pneumonia. Bibasilar atelectasis. 3. Areas of adenopathy as noted. Question etiology. Potential reactive adenopathy due to pneumonia is possible. 4. There is prominence of the main pulmonary outflow tract, a finding felt to be indicative of a degree of pulmonary arterial hypertension. 5. Slight reflux of contrast in the inferior vena cava. This is a finding that may indicate a degree of increase in right heart pressure. Electronically Signed   By: Bretta Bang III M.D.   On: 08/31/2017 10:53    Procedures Procedures (including critical care time) CRITICAL CARE Performed by: Lyndel Safe Total critical care time:  40 minutes Critical care time was exclusive of separately billable procedures and treating other patients. Critical care was necessary to treat or prevent imminent or life-threatening deterioration. Critical care was time spent personally by me on the following activities: development of treatment plan with patient and/or surrogate as well as nursing, discussions with consultants, evaluation of patient's response to treatment, examination of patient, obtaining history from patient or surrogate, ordering and performing treatments and interventions, ordering and review of laboratory studies, ordering and review of radiographic studies, pulse oximetry and re-evaluation of patient's condition.   Medications Ordered in ED Medications  cefTRIAXone (ROCEPHIN) 1 g in sodium chloride 0.9 % 100 mL IVPB (has no administration in time range)  azithromycin (ZITHROMAX) 500 mg in sodium chloride 0.9 % 250 mL IVPB (has no administration in time range)  iopamidol (ISOVUE-370) 76 % injection (72 mLs  Contrast Given 08/31/17 1015)     Initial Impression / Assessment and Plan / ED Course  I have reviewed the triage vital signs and the nursing notes.  Pertinent labs & imaging results that were available during my care of the patient were reviewed by me and considered in my medical decision making (see chart for details).  Clinical Course as of Sep 01 1115  Mon Aug 31, 2017  1104 PORT score 77   [EH]    Clinical Course User Index [EH] Cristina Gong, PA-C   Patient presents today for evaluation of a syncopal event.  He was initially hypotensive with EMS, was given a 500 mL fluid bolus after which his blood pressure improved.  He has had a cough recently along with extensive travel.  CXR concerning for edema  Concern for PE based on recent travels, elevated heart rate.  CTA pe study obtained showing pneumonia, possible right heart strain, with out obvious PE.   PORT score of 77.  Patient is not in low risk  for his syncope according to [place] sync[place] rules.  Blood cultures were ordered.  He was started on IV antibiotics.  Based on patient being normotensive, lactic not elevated, LVEF 40-45, CXR with edema, concern that fluid would worsen his condition, therefore held unless he becomes more tachycardic or hypotensive.    Plan to admit patient, I spoke with [facility] hospitalist who will admit patient.  Final Clinical Impressions(s) / ED Diagnoses   Final diagnoses:  Community acquired pneumonia of right upper lobe of lung [facility])    ED Discharge Orders    None  Cristina Gong, PA-C 08/31/17 1211    Melene Plan, DO 08/31/17 1310

## 2017-08-31 NOTE — Procedures (Signed)
ELECTROENCEPHALOGRAM REPORT   Patient: Timothy Cantu       Room #: 3E27C EEG No. ID: [id number] Age: 67 y.o.        Sex: male Referring Physician: Black Report Date:  08/31/2017        Interpreting Physician: Thana Farr  History: Timothy Cantu is an 67 y.o. male with syncope evaluated to rule out seizure  Medications:  Coumadin, Zithromax, Coreg, Valtrex, Rocephin  Conditions of Recording:  This is a 16 channel EEG carried out with the patient in the awake state.  Description:  The waking background activity consists of a low voltage, symmetrical, fairly well organized, 10 Hz alpha activity, seen from the parieto-occipital and posterior temporal regions.  Low voltage fast activity, poorly organized, is seen anteriorly and is at times superimposed on more posterior regions.  A mixture of theta and alpha rhythms are seen from the central and temporal regions. The patient does not drowse or sleep. No epileptiform activity is noted.  Hyperventilation and intermittent photic stimulation were not performed   IMPRESSION: This is a normal awake electroencephalogram.  There are no focal lateralizing or epileptiform features.  Comment:  An EEG with the patient sleep deprived to elicit drowse and light sleep may be desirable to further elicit a possible seizure disorder.     Thana Farr, MD Neurology [phone] 08/31/2017, 4:00 PM

## 2017-08-31 NOTE — Progress Notes (Signed)
ANTICOAGULATION CONSULT NOTE - Initial Consult  Pharmacy Consult for warfarin Indication: atrial fibrillation  No Known Allergies  Patient Measurements: Height: 6\' 2"  (188 cm) Weight: 243 lb (110.2 kg) IBW/kg (Calculated) : 82.2  Vital Signs: Temp: 97.8 F (36.6 C) (07/15 0821) Temp Source: Oral (07/15 0821) BP: 120/73 (07/15 1100) Pulse Rate: 104 (07/15 1100)  Labs: Recent Labs    08/31/17 0839  HGB 14.7  HCT 46.0  PLT 143*  LABPROT 17.3*  INR 1.43  CREATININE 1.23    Estimated Creatinine Clearance: 77 mL/min (by C-G formula based on SCr of 1.23 mg/dL).   Medical History: Past Medical History:  Diagnosis Date  . Atrial fibrillation (HCC)   . CAP (community acquired pneumonia)   . CHF (congestive heart failure) (HCC)   . Dilated cardiomyopathy (HCC)   . ETOH abuse   . Long-term (current) use of anticoagulants   . Sciatica   . Syncope 02/2003   pt was out for 5-6 minutes   Assessment: 36 yof presented to the ED with syncope. He is on chronic coumadin for history of afib. INR is subtherapeutic at 1.43. Hgb is WNL and platelets are slightly low. CT is negative for PE. Pt also started on azithromycin which may increase the INR. Pt took his dose today PTA.   Goal of Therapy:  INR 2-3 Monitor platelets by anticoagulation protocol: Yes   Plan:  Give additional warfarin 5mg  PO x 1 now Daily INR  Karaline Buresh, Drake Leach 08/31/2017,12:40 PM

## 2017-08-31 NOTE — ED Notes (Signed)
Patient transported to CT 

## 2017-08-31 NOTE — ED Notes (Signed)
EEG at bedside.

## 2017-09-01 DIAGNOSIS — I420 Dilated cardiomyopathy: Secondary | ICD-10-CM | POA: Diagnosis not present

## 2017-09-01 DIAGNOSIS — J181 Lobar pneumonia, unspecified organism: Secondary | ICD-10-CM

## 2017-09-01 DIAGNOSIS — R55 Syncope and collapse: Secondary | ICD-10-CM

## 2017-09-01 DIAGNOSIS — I10 Essential (primary) hypertension: Secondary | ICD-10-CM

## 2017-09-01 DIAGNOSIS — I4891 Unspecified atrial fibrillation: Secondary | ICD-10-CM

## 2017-09-01 LAB — CBC
HCT: 46 % (ref 39.0–52.0)
Hemoglobin: 14.7 g/dL (ref 13.0–17.0)
MCH: 30.2 pg (ref 26.0–34.0)
MCHC: 32 g/dL (ref 30.0–36.0)
MCV: 94.7 fL (ref 78.0–100.0)
PLATELETS: 161 10*3/uL (ref 150–400)
RBC: 4.86 MIL/uL (ref 4.22–5.81)
RDW: 13.1 % (ref 11.5–15.5)
WBC: 8.8 10*3/uL (ref 4.0–10.5)

## 2017-09-01 LAB — COMPREHENSIVE METABOLIC PANEL
ALK PHOS: 71 U/L (ref 38–126)
ALT: 95 U/L — AB (ref 0–44)
AST: 53 U/L — AB (ref 15–41)
Albumin: 3.2 g/dL — ABNORMAL LOW (ref 3.5–5.0)
Anion gap: 8 (ref 5–15)
BUN: 14 mg/dL (ref 8–23)
CALCIUM: 8.7 mg/dL — AB (ref 8.9–10.3)
CO2: 23 mmol/L (ref 22–32)
CREATININE: 0.93 mg/dL (ref 0.61–1.24)
Chloride: 106 mmol/L (ref 98–111)
Glucose, Bld: 114 mg/dL — ABNORMAL HIGH (ref 70–99)
Potassium: 4 mmol/L (ref 3.5–5.1)
Sodium: 137 mmol/L (ref 135–145)
Total Bilirubin: 0.7 mg/dL (ref 0.3–1.2)
Total Protein: 6.4 g/dL — ABNORMAL LOW (ref 6.5–8.1)

## 2017-09-01 LAB — HIV ANTIBODY (ROUTINE TESTING W REFLEX): HIV Screen 4th Generation wRfx: NONREACTIVE

## 2017-09-01 LAB — GLUCOSE, CAPILLARY: Glucose-Capillary: 121 mg/dL — ABNORMAL HIGH (ref 70–99)

## 2017-09-01 LAB — PROTIME-INR
INR: 1.85
PROTHROMBIN TIME: 21.2 s — AB (ref 11.4–15.2)

## 2017-09-01 MED ORDER — WARFARIN SODIUM 7.5 MG PO TABS: 7.5000 mg | ORAL_TABLET | Freq: Once | ORAL | Status: DC

## 2017-09-01 MED ORDER — ACETAMINOPHEN 325 MG PO TABS: 650.0000 mg | ORAL_TABLET | Freq: Four times a day (QID) | ORAL | Status: DC | PRN

## 2017-09-01 MED ORDER — CARVEDILOL 25 MG PO TABS: 25.0000 mg | ORAL_TABLET | Freq: Two times a day (BID) | ORAL | Status: DC

## 2017-09-01 MED ORDER — AMOXICILLIN-POT CLAVULANATE 875-125 MG PO TABS: 1.0000 | ORAL_TABLET | Freq: Two times a day (BID) | ORAL | 0 refills | Status: AC

## 2017-09-01 NOTE — Progress Notes (Signed)
ANTICOAGULATION CONSULT NOTE  Pharmacy Consult:  Coumadin Indication: atrial fibrillation  No Known Allergies  Patient Measurements: Height: 6\' 2"  (188 cm) Weight: 240 lb 4.8 oz (109 kg) IBW/kg (Calculated) : 82.2  Vital Signs: Temp: 98.3 F (36.8 C) (07/16 0919) Temp Source: Oral (07/16 0919) BP: 129/87 (07/16 0919) Pulse Rate: 102 (07/16 0919)  Labs: Recent Labs    08/31/17 0839 09/01/17 0659  HGB 14.7 14.7  HCT 46.0 46.0  PLT 143* 161  LABPROT 17.3* 21.2*  INR 1.43 1.85  CREATININE 1.23 0.93    Estimated Creatinine Clearance: 101.3 mL/min (by C-G formula based on SCr of 0.93 mg/dL).   Assessment: 30 yof presented to the ED with syncope. He is on chronic Coumadin for history of Afib.  INR is sub-therapeutic but trending up with higher than home doses.  No bleeding reported.  Home Coumadin dose: 5mg  PO daily except 2.5mg  on Mon   Goal of Therapy:  INR 2-3 Monitor platelets by anticoagulation protocol: Yes    Plan:  Repeat Coumadin 7.5mg  PO today, give prior to discharge Daily PT / INR   Corin Tilly D. Laney Potash, PharmD, BCPS, BCCCP 09/01/2017, 10:22 AM

## 2017-09-01 NOTE — Discharge Summary (Addendum)
Physician Discharge Summary  Timothy Cantu ZOX:[id number] DOB: 1950-04-17 DOA: 08/31/2017  PCP: [name], MD  Admit date: 08/31/2017 Discharge date: 09/01/2017  Time spent: 40* minutes  Recommendations for Outpatient Follow-up:  1. Follow up PCP in 2 weeks  Discharge Diagnoses:  Principal Problem:   Syncope Active Problems:   Congestive dilated cardiomyopathy (HCC)   ATRIAL FIBRILLATION   HTN (hypertension)   CAP (community acquired pneumonia)   Discharge Condition: Stable  Diet recommendation: Regular diet  Filed Weights   08/31/17 0823 09/01/17 0439  Weight: 110.2 kg (243 lb) 109 kg (240 lb 4.8 oz)    History of present illness:  67 year old male with a history of alcohol abuse, dilated cardiomyopathy with CHF, ejection fraction 40 to 25%, atrial fibrillation on anticoagulation with warfarin was brought to hospital after patient had questionable syncopal episode.  As per patient's interpreter patient did not pass out and was aware of proceedings while he was down.  There is no witnessed seizure activity.  As per patient he just felt weak and fell down.  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  There was a episode of stool incontinence but EEG was negative for seizure activity.  CT scan showed a right upper lobe pneumonia and patient started on IV antibiotics ceftriaxone and Zithromax.  Blood cultures were obtained.  Hospital Course:   Right upper lobe pneumonia-improved, will discharge patient on Augmentin for 7 more days.  Blood cultures x2 are negative to date.  ?  Syncope-telemetry shows no acute abnormality.  Patient did not have syncopal episode he may have had postural hypotension and his blood pressure was 88/60 after EMS arrived and gave him 500 cc of normal saline after that blood pressure was 109/69.  Also he had poor p.o. intake after he developed cough for the past few days.  He has been trying some  NyQuil and DayQuil at home.  Blood pressure is now stabilized.  No further intervention.  Patient will follow-up with cardiologist Dr. Eden Emms as outpatient.  I have asked patient to stop taking Valtrex.  CHF/alcoholic induced cardiomyopathy-stable, continue Coreg.  Atrial fibrillation-heart rate is controlled, continue Coreg, warfarin.  Hypertension-blood pressure is now stable, continue Coreg, lisinopril/HCTZ.  Procedures:  None  Consultations:  None  Discharge Exam: Vitals:   09/01/17 0439 09/01/17 0919  BP: (!) 134/98 129/87  Pulse: (!) 108 (!) 102  Resp: 18 16  Temp: 98.1 F (36.7 C) 98.3 F (36.8 C)  SpO2: 99% 97%    General: Appears in no acute distress Cardiovascular: S1-S2, regular Respiratory: Clear to auscultation bilaterally  Discharge Instructions   Discharge Instructions    Diet - low sodium heart healthy   Complete by:  As directed    Increase activity slowly   Complete by:  As directed      Allergies as of 09/01/2017   No Known Allergies     Medication List    STOP taking these medications   DAYQUIL/NYQUIL COLD/FLU RELIEF PO   ibuprofen 200 MG tablet Commonly known as:  ADVIL,MOTRIN   NYQUIL COLD & FLU PO   valACYclovir 500 MG tablet Commonly known as:  VALTREX     TAKE these medications   acetaminophen 325 MG tablet Commonly known as:  TYLENOL Take 2 tablets (650 mg total) by mouth every 6 (six) hours as needed for mild pain (or Fever >/= 101).   amoxicillin-clavulanate 875-125 MG tablet Commonly known as:  AUGMENTIN Take 1 tablet by mouth 2 (two) times daily for 7 days.   carvedilol 25 MG tablet Commonly known as:  COREG Take 1 tablet (25 mg total) by mouth 2 (two) times daily with a meal.   losartan-hydrochlorothiazide 100-25 MG tablet Commonly known as:  HYZAAR Take 1 tablet by mouth daily.   warfarin 5 MG tablet Commonly known as:  COUMADIN Take as directed. If you are unsure how to take this medication, talk to your  nurse or doctor. Original instructions:  Take As Directed by Coumadin Clinic      No Known Allergies    The results of significant diagnostics from this hospitalization (including imaging, microbiology, ancillary and laboratory) are listed below for reference.    Significant Diagnostic Studies: Dg Chest 2 View  Result Date: 08/31/2017 CLINICAL DATA:  Syncope. EXAM: CHEST - 2 VIEW COMPARISON:  None. FINDINGS: Mild cardiomegaly. Pulmonary vascular congestion. Slightly coarsened interstitial markings, greater on the right. No focal consolidation, pleural effusion, or pneumothorax. No acute osseous abnormality. IMPRESSION: Cardiomegaly and pulmonary vascular congestion without overt edema. Electronically Signed   By: Obie Dredge M.D.   On: 08/31/2017 09:06   Ct Head Wo Contrast  Result Date: 08/31/2017 CLINICAL DATA:  Syncope with questionable fall EXAM: CT HEAD WITHOUT CONTRAST TECHNIQUE: Contiguous axial images were obtained from the base of the skull through the vertex without intravenous contrast. COMPARISON:  None. FINDINGS: Brain: There is mild age related volume loss. There is no intracranial mass, hemorrhage, extra-axial fluid collection, or midline shift. There is mild patchy small vessel disease in the centra semiovale bilaterally. Elsewhere gray-white compartments appear normal. No acute infarct evident. Vascular: There is no hyperdense vessel evident. There is no appreciable vascular calcification. Skull: Bony calvarium appears intact. Sinuses/Orbits: There is mucosal thickening in each maxillary antrum. There are retention cysts in the superior and inferior maxillary antrum regions on the left. There is mucosal thickening in several ethmoid air cells. 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 No intraorbital lesions identified on either side. Other: Mastoid air cells are clear. IMPRESSION: 1. Age related volume loss with mild patchy  periventricular small vessel disease. No acute infarct evident. No mass or hemorrhage. 2.  Foci of paranasal sinus disease. 3.  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 Electronically Signed   By: Bretta Bang III M.D.   On: 08/31/2017 10:45   Ct Angio Chest Pe W/cm &/or Wo Cm  Result Date: 08/31/2017 CLINICAL DATA:  Chest pain and shortness of breath EXAM: CT ANGIOGRAPHY CHEST WITH CONTRAST TECHNIQUE: Multidetector CT imaging of the chest was performed using the standard protocol during bolus administration of intravenous contrast. Multiplanar CT image reconstructions and MIPs were obtained to evaluate the vascular anatomy. CONTRAST:  72mL ISOVUE-370 IOPAMIDOL (ISOVUE-370) INJECTION 76% COMPARISON:  Chest radiograph August 31, 2017 FINDINGS: Cardiovascular: There is no appreciable pulmonary embolus. There is no thoracic aortic aneurysm or dissection. The contrast bolus in the aorta is somewhat less than optimal for assessment for dissection. Visualized great vessels appear unremarkable. There is no pericardial effusion or pericardial thickening evident. There is prominence of the main pulmonary outflow tract measuring 3.2 cm. Mediastinum/Nodes: Visualized thyroid appears normal. There is a hilar lymph node on the right measuring 2.6 x 1.8 cm. There is a subcarinal lymph node measuring 2.6 x 1.7 cm. There is a left hilar lymph node measuring 1.7 x 1.0 cm. There are several subcentimeter mediastinal lymph nodes  as well. No esophageal lesions are evident. Lungs/Pleura: There are areas of patchy atelectasis in the lung bases. There is patchy opacity throughout much of the right upper lobe with tree on bud type appearance throughout much of the right upper lobe. Similar changes not seen elsewhere. No pleural effusion or pleural thickening evident. Upper Abdomen: There is mild reflux into the inferior vena cava. Visualized upper abdominal structures otherwise appear normal. Musculoskeletal: There  are foci of degenerative change in the mid to lower thoracic spine. No blastic or lytic bone lesions. No chest wall lesions evident. Review of the MIP images confirms the above findings. IMPRESSION: 1. No demonstrable pulmonary embolus. No thoracic aortic aneurysm or dissection. The contrast bolus in the aorta is somewhat less than optimal for potential dissection assessment. 2. Tree on bud type appearance throughout much of the right upper lobe consistent with pneumonia. Bibasilar atelectasis. 3. Areas of adenopathy as noted. Question etiology. Potential reactive adenopathy due to pneumonia is possible. 4. There is prominence of the main pulmonary outflow tract, a finding felt to be indicative of a degree of pulmonary arterial hypertension. 5. Slight reflux of contrast in the inferior vena cava. This is a finding that may indicate a degree of increase in right heart pressure. Electronically Signed   By: Bretta Bang III M.D.   On: 08/31/2017 10:53    Microbiology: No results found for this or any previous visit (from the past 240 hour(s)).   Labs: Basic Metabolic Panel: Recent Labs  Lab 08/31/17 0839 09/01/17 0659  NA 138 137  K 4.6 4.0  CL 105 106  CO2 25 23  GLUCOSE 130* 114*  BUN 22 14  CREATININE 1.23 0.93  CALCIUM 8.9 8.7*   Liver Function Tests: Recent Labs  Lab 09/01/17 0659  AST 53*  ALT 95*  ALKPHOS 71  BILITOT 0.7  PROT 6.4*  ALBUMIN 3.2*   No results for input(s): LIPASE, AMYLASE in the last 168 hours. No results for input(s): AMMONIA in the last 168 hours. CBC: Recent Labs  Lab 08/31/17 0839 09/01/17 0659  WBC 9.8 8.8  HGB 14.7 14.7  HCT 46.0 46.0  MCV 97.5 94.7  PLT 143* 161   Cardiac Enzymes: No results for input(s): CKTOTAL, CKMB, CKMBINDEX, TROPONINI in the last 168 hours. BNP: BNP (last 3 results) Recent Labs    08/31/17 0847  BNP 94.4    ProBNP (last 3 results) No results for input(s): PROBNP in the last 8760 hours.  CBG: Recent Labs   Lab 08/31/17 0832 09/01/17 0736  GLUCAP 125* 121*       Signed:  Meredeth Ide MD.  [facility] 09/01/2017, 10:18 AM

## 2017-09-01 NOTE — Discharge Instructions (Signed)

## 2017-09-05 LAB — CULTURE, BLOOD (ROUTINE X 2)
CULTURE: NO GROWTH
Culture: NO GROWTH
Special Requests: ADEQUATE
Special Requests: ADEQUATE

## 2017-09-19 ENCOUNTER — Other Ambulatory Visit: Payer: Self-pay | Admitting: Cardiovascular Disease

## 2017-09-23 ENCOUNTER — Ambulatory Visit (INDEPENDENT_AMBULATORY_CARE_PROVIDER_SITE_OTHER): Payer: PRIVATE HEALTH INSURANCE | Admitting: *Deleted

## 2017-09-23 DIAGNOSIS — Z7901 Long term (current) use of anticoagulants: Secondary | ICD-10-CM

## 2017-09-23 DIAGNOSIS — Z5181 Encounter for therapeutic drug level monitoring: Secondary | ICD-10-CM | POA: Diagnosis not present

## 2017-09-23 DIAGNOSIS — I4891 Unspecified atrial fibrillation: Secondary | ICD-10-CM | POA: Diagnosis not present

## 2017-09-23 LAB — POCT INR: INR: 2.8 (ref 2.0–3.0)

## 2017-09-23 NOTE — Patient Instructions (Signed)
Description   Continue taking 1 tablet every day except 1/2 tablet only on Mondays.  Recheck INR in 6 weeks. Call us with any medication changes or concerns # [phone] [facility]

## 2017-10-06 ENCOUNTER — Telehealth: Payer: Self-pay | Admitting: Registered Nurse

## 2017-10-06 NOTE — Telephone Encounter (Signed)
Noted patient has seen PCM since [facility] 2018 Aug and still on anticoagulation medicine  HgbA1c improved with dietary changes was recently admitted and discharged for pneumonia  09/08/17 last appt with PCM and 09/23/17 last coag clinic appt.  Please follow up with patient still feeling well

## 2017-11-04 ENCOUNTER — Ambulatory Visit (INDEPENDENT_AMBULATORY_CARE_PROVIDER_SITE_OTHER): Payer: PRIVATE HEALTH INSURANCE

## 2017-11-04 DIAGNOSIS — Z5181 Encounter for therapeutic drug level monitoring: Secondary | ICD-10-CM

## 2017-11-04 DIAGNOSIS — Z7901 Long term (current) use of anticoagulants: Secondary | ICD-10-CM

## 2017-11-04 DIAGNOSIS — I4891 Unspecified atrial fibrillation: Secondary | ICD-10-CM | POA: Diagnosis not present

## 2017-11-04 LAB — POCT INR: INR: 3.4 — AB (ref 2.0–3.0)

## 2017-11-04 NOTE — Patient Instructions (Signed)
Description   Skip tomorrow's dosage of Coumadin, then resume same dosage 1 tablet every day except 1/2 tablet only on Mondays.  Recheck INR in 3 weeks. Call us with any medication changes or concerns # [phone] [facility]

## 2017-11-25 ENCOUNTER — Ambulatory Visit (INDEPENDENT_AMBULATORY_CARE_PROVIDER_SITE_OTHER): Payer: No Typology Code available for payment source | Admitting: *Deleted

## 2017-11-25 DIAGNOSIS — Z7901 Long term (current) use of anticoagulants: Secondary | ICD-10-CM

## 2017-11-25 DIAGNOSIS — I4891 Unspecified atrial fibrillation: Secondary | ICD-10-CM

## 2017-11-25 DIAGNOSIS — Z5181 Encounter for therapeutic drug level monitoring: Secondary | ICD-10-CM

## 2017-11-25 LAB — POCT INR: INR: 2.1 (ref 2.0–3.0)

## 2017-11-25 NOTE — Patient Instructions (Signed)
Description   Continue taking 1 tablet every day except 1/2 tablet only on Mondays.  Recheck INR in 5 weeks with MD appt. Call us with any medication changes or concerns # [phone] [facility]

## 2017-12-22 ENCOUNTER — Other Ambulatory Visit: Payer: Self-pay | Admitting: Cardiovascular Disease

## 2017-12-23 NOTE — Progress Notes (Signed)
Patient ID: Timothy Cantu, male   DOB: 06/23/1950, 67 y.o.   MRN: [id number]   Timothy Cantu has a history of DCM with EF 15%. It was likely from ETOH. His EF had returned to near  normal with abstinance. He has chronic afib and is on coumadin. He has not had any bleeding problems. He continues to work at [employer]. He had a cath in 2005 after syncope and has no CAD. He has not had recurrent syncope since he stopped drinking.   Last echo 2/15    Study Conclusions  - Left ventricle: The cavity size was mildly dilated. Wall thickness was increased in a pattern of mild LVH. Systolic function was mildly to moderately reduced. The estimated ejection fraction was in the range of 40% to 45%. - Mitral valve: Mild regurgitation. - Left atrium: The atrium was severely dilated. - Right atrium: The atrium was mildly dilated.  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  All other systems reviewed and negative  BP 106/76   Pulse (!) 103   Ht 6\' 2"  (1.88 m)   Wt 256 lb 8 oz (116.3 kg)   SpO2 96%   BMI 32.93 kg/m   General: Affect appropriate Healthy:  appears stated age HEENT: normal Neck supple with no adenopathy JVP normal no bruits no thyromegaly Lungs clear with no wheezing and good  diaphragmatic motion Heart:  S1/S2 no murmur, no rub, gallop or click PMI normal Abdomen: benighn, BS positve, no tenderness, no AAA no bruit.  No HSM or HJR Distal pulses intact with no bruits No edema Neuro non-focal Skin warm and dry No muscular weakness    Current Outpatient Medications  Medication Sig Dispense Refill  . acetaminophen (TYLENOL) 325 MG tablet Take 2 tablets (650 mg total) by mouth every 6 (six) hours as needed for mild pain (or Fever >/= 101).    . carvedilol (COREG) 25 MG tablet Take 1 tablet (25 mg total) by mouth 2 (two) times daily with a meal. 180 tablet 3  . losartan-hydrochlorothiazide (HYZAAR) 100-25 MG tablet Take 1 tablet by mouth daily. 90 tablet 3  . warfarin (COUMADIN) 5 MG tablet Take As Directed by [facility]in Clinic 100 tablet 0   No current facility-administered medications for this visit.     Allergies  Patient has no known allergies.  Electrocardiogram:  afib rate 82  Poor R wave progression  10/12/13 afib rate 96  Nonspecific ST changes   04/12/14  afib nonspecific ST changes  No change  Rate 86   05/17/15  afib rate 98  PVC poor R wave progression 11/12/16 afib poor R wave progression no changes   Assessment and Plan  DCM: Euvolemic discussed low sodium diet continue current meds 90 day refills called in  Update TTE since last was on 04/06/13   HTN:  Well controlled.  Continue current medications and low sodium Dash type diet.     Afib: good rate control and anticoagulation INR check today refill called in   INR 2.1 ib 10.9/19   Syncope:  Related to dehydration and pneumonia non cardiac see above regarding need for TTE    F/U with me in a year unless TTE shows worsening EF   [name]

## 2017-12-31 ENCOUNTER — Encounter: Payer: Self-pay | Admitting: Cardiovascular Disease

## 2017-12-31 ENCOUNTER — Ambulatory Visit (INDEPENDENT_AMBULATORY_CARE_PROVIDER_SITE_OTHER): Payer: No Typology Code available for payment source | Admitting: *Deleted

## 2017-12-31 ENCOUNTER — Ambulatory Visit (INDEPENDENT_AMBULATORY_CARE_PROVIDER_SITE_OTHER): Payer: No Typology Code available for payment source | Admitting: Cardiovascular Disease

## 2017-12-31 ENCOUNTER — Other Ambulatory Visit: Payer: Self-pay | Admitting: *Deleted

## 2017-12-31 VITALS — BP 106/76 | HR 103 | Ht 74.0 in | Wt 256.5 lb

## 2017-12-31 DIAGNOSIS — I429 Cardiomyopathy, unspecified: Secondary | ICD-10-CM

## 2017-12-31 DIAGNOSIS — Z7901 Long term (current) use of anticoagulants: Secondary | ICD-10-CM

## 2017-12-31 DIAGNOSIS — Z5181 Encounter for therapeutic drug level monitoring: Secondary | ICD-10-CM

## 2017-12-31 DIAGNOSIS — I4891 Unspecified atrial fibrillation: Secondary | ICD-10-CM

## 2017-12-31 LAB — POCT INR: INR: 2.3 (ref 2.0–3.0)

## 2017-12-31 MED ORDER — WARFARIN SODIUM 5 MG PO TABS: ORAL_TABLET | ORAL | 1 refills | Status: DC

## 2017-12-31 MED ORDER — CARVEDILOL 25 MG PO TABS: 25.0000 mg | ORAL_TABLET | Freq: Two times a day (BID) | ORAL | 3 refills | Status: DC

## 2017-12-31 MED ORDER — LOSARTAN POTASSIUM-HCTZ 100-25 MG PO TABS: 1.0000 | ORAL_TABLET | Freq: Every day | ORAL | 3 refills | Status: DC

## 2017-12-31 MED ORDER — WARFARIN SODIUM 5 MG PO TABS: ORAL_TABLET | ORAL | 0 refills | Status: DC

## 2017-12-31 NOTE — Patient Instructions (Signed)
Description   Continue taking 1 tablet every day except 1/2 tablet only on Mondays.  Recheck INR in 5 weeks.  Call us with any medication changes or concerns # [phone] [facility]

## 2017-12-31 NOTE — Patient Instructions (Signed)
Medication Instructions:   If you need a refill on your cardiac medications before your next appointment, please call your pharmacy.   Lab work:  If you have labs (blood work) drawn today and your tests are completely normal, you will receive your results only by: Marland Kitchen MyChart Message (if you have MyChart) OR . A paper copy in the mail If you have any lab test that is abnormal or we need to change your treatment, we will call you to review the results.  Testing/Procedures: Your physician has requested that you have an echocardiogram. Echocardiography is a painless test that uses sound waves to create images of your heart. It provides your doctor with information about the size and shape of your heart and how well your heart's chambers and valves are working. This procedure takes approximately one hour. There are no restrictions for this procedure.  Follow-Up: At [facility], you and your health needs are our priority.  As part of our continuing mission to provide you with exceptional heart care, we have created designated Provider Care Teams.  These Care Teams include your primary Cardiologist (physician) and Advanced Practice Providers (APPs -  Physician Assistants and Nurse Practitioners) who all work together to provide you with the care you need, when you need it. You will need a follow up appointment in 6 month f/u.  Please call our office 2 months in advance to schedule this appointment.  You may see [name], MD or one of the following Advanced Practice Providers on your designated Care Team:   Norma Fredrickson, NP Nada Boozer, NP . Georgie Chard, NP

## 2018-01-01 ENCOUNTER — Other Ambulatory Visit: Payer: Self-pay | Admitting: *Deleted

## 2018-01-01 ENCOUNTER — Telehealth: Payer: Self-pay | Admitting: *Deleted

## 2018-01-01 MED ORDER — LOSARTAN POTASSIUM 100 MG PO TABS: 100.0000 mg | ORAL_TABLET | Freq: Every day | ORAL | 3 refills | Status: DC

## 2018-01-01 MED ORDER — HYDROCHLOROTHIAZIDE 25 MG PO TABS: 25.0000 mg | ORAL_TABLET | Freq: Every day | ORAL | 3 refills | Status: DC

## 2018-01-01 NOTE — Telephone Encounter (Signed)
Spoke with patient and made her aware of the change. She verbalized her understanding and thanked me for the call.

## 2018-01-01 NOTE — Telephone Encounter (Signed)
Received a fax from [facility] indicating that the losartan-hctz 100-25 mg tabs are on backorder. They are requesting an alternative. Can I d/c this order and send in as two separate rx's, one for losartan 100 mg and one for hctz 25 mg? Please advise. Thanks, MI

## 2018-01-07 ENCOUNTER — Ambulatory Visit (HOSPITAL_COMMUNITY): Payer: No Typology Code available for payment source | Attending: Cardiovascular Disease

## 2018-01-07 ENCOUNTER — Other Ambulatory Visit: Payer: Self-pay

## 2018-01-07 DIAGNOSIS — I429 Cardiomyopathy, unspecified: Secondary | ICD-10-CM | POA: Diagnosis not present

## 2018-01-29 NOTE — Telephone Encounter (Signed)
This encounter was created in error - please disregard.

## 2018-02-04 ENCOUNTER — Ambulatory Visit (INDEPENDENT_AMBULATORY_CARE_PROVIDER_SITE_OTHER): Payer: No Typology Code available for payment source | Admitting: *Deleted

## 2018-02-04 DIAGNOSIS — I4891 Unspecified atrial fibrillation: Secondary | ICD-10-CM

## 2018-02-04 DIAGNOSIS — Z5181 Encounter for therapeutic drug level monitoring: Secondary | ICD-10-CM

## 2018-02-04 DIAGNOSIS — Z7901 Long term (current) use of anticoagulants: Secondary | ICD-10-CM | POA: Diagnosis not present

## 2018-02-04 LAB — POCT INR: INR: 3.3 — AB (ref 2.0–3.0)

## 2018-02-04 NOTE — Patient Instructions (Signed)
Description   Skip tomorrow's dose, then Continue taking 1 tablet every day except 1/2 tablet only on Mondays.  Recheck INR in 4 weeks.  Call us with any medication changes or concerns # [phone] [facility]

## 2018-03-30 ENCOUNTER — Ambulatory Visit: Payer: Self-pay | Admitting: *Deleted

## 2018-03-30 VITALS — BP 125/83

## 2018-03-30 DIAGNOSIS — Z7901 Long term (current) use of anticoagulants: Secondary | ICD-10-CM

## 2018-03-30 DIAGNOSIS — I4891 Unspecified atrial fibrillation: Secondary | ICD-10-CM

## 2018-03-30 NOTE — Progress Notes (Addendum)
INR check. Normally has it done at [facility]. Looking to make it more convenient to have done as he has missed appointments recently. Last check Dec 2019, missed January. No Feb appt scheduled. Pt denies any bleeding/bruising. Goal per [facility] notes 2.0-3.0. Will [facility] cardiology RN Tiffany Muse on results for them to contact pt tomorrow as RN not on site until Thursday. Staff msg sent to RN Muse as well to notify them.  Pt also signed his [facility] forms for insurance premium discount program. Labs from 12-30-17 pcp OV. Forms placed in paper chart.

## 2018-03-31 ENCOUNTER — Ambulatory Visit (INDEPENDENT_AMBULATORY_CARE_PROVIDER_SITE_OTHER): Payer: PRIVATE HEALTH INSURANCE | Admitting: Pharmacist

## 2018-03-31 DIAGNOSIS — Z5181 Encounter for therapeutic drug level monitoring: Secondary | ICD-10-CM

## 2018-03-31 DIAGNOSIS — I4891 Unspecified atrial fibrillation: Secondary | ICD-10-CM | POA: Diagnosis not present

## 2018-03-31 LAB — PROTIME-INR
INR: 1.9 — ABNORMAL HIGH (ref 0.8–1.2)
Prothrombin Time: 18.7 s — ABNORMAL HIGH (ref 9.1–12.0)

## 2018-04-01 NOTE — Progress Notes (Signed)
noted 

## 2018-04-01 NOTE — Progress Notes (Signed)
Good morning, You had mentioned in your note that pt needs to f/u with Coumadin clinic. Is it okay if he continues having his inr checks done at work and I send through to the clinic going forward? I was about to go ahead and get him on my schedule for 4 weeks out if that is okay. And is there a pool to route the results to, or only specific people?

## 2018-04-01 NOTE — Progress Notes (Signed)
Okay. I understand. I am betting this is due to the language barrier as neither he nor his wife speak english well. He has a coworker that comes to the clinic with him that translates for him. I have left a mesg with her to contact me so I can pass along your concerns you laid out so a plan can be made. I expect I will hear back from her tomorrow. Thank you.

## 2018-04-02 ENCOUNTER — Ambulatory Visit (INDEPENDENT_AMBULATORY_CARE_PROVIDER_SITE_OTHER): Payer: PRIVATE HEALTH INSURANCE | Admitting: Pharmacist

## 2018-04-02 DIAGNOSIS — Z7901 Long term (current) use of anticoagulants: Secondary | ICD-10-CM

## 2018-04-02 DIAGNOSIS — Z5181 Encounter for therapeutic drug level monitoring: Secondary | ICD-10-CM | POA: Diagnosis not present

## 2018-04-02 DIAGNOSIS — I4891 Unspecified atrial fibrillation: Secondary | ICD-10-CM | POA: Diagnosis not present

## 2018-04-02 LAB — POCT INR: INR: 1.6 — AB (ref 2.0–3.0)

## 2018-04-02 NOTE — Patient Instructions (Signed)
Description   Take 1.5 tablets today then continue taking 1 tablet every day except 1/2 tablet only on Mondays.  Recheck INR in 3 weeks.  Call us with any medication changes or concerns # [phone] [facility]

## 2018-04-23 ENCOUNTER — Ambulatory Visit (INDEPENDENT_AMBULATORY_CARE_PROVIDER_SITE_OTHER): Payer: PRIVATE HEALTH INSURANCE | Admitting: *Deleted

## 2018-04-23 DIAGNOSIS — Z7901 Long term (current) use of anticoagulants: Secondary | ICD-10-CM | POA: Diagnosis not present

## 2018-04-23 DIAGNOSIS — I4891 Unspecified atrial fibrillation: Secondary | ICD-10-CM

## 2018-04-23 DIAGNOSIS — Z5181 Encounter for therapeutic drug level monitoring: Secondary | ICD-10-CM

## 2018-04-23 LAB — POCT INR: INR: 3 (ref 2.0–3.0)

## 2018-04-23 NOTE — Patient Instructions (Signed)
Description   Continue taking 1 tablet every day except 1/2 tablet only on Mondays.  Recheck INR in 4 weeks.  Call us with any medication changes or concerns # [phone] [facility]

## 2018-05-20 ENCOUNTER — Telehealth: Payer: Self-pay

## 2018-05-20 NOTE — Telephone Encounter (Signed)

## 2018-05-21 ENCOUNTER — Other Ambulatory Visit: Payer: Self-pay

## 2018-05-21 ENCOUNTER — Ambulatory Visit (INDEPENDENT_AMBULATORY_CARE_PROVIDER_SITE_OTHER): Payer: PRIVATE HEALTH INSURANCE | Admitting: Pharmacist

## 2018-05-21 DIAGNOSIS — I4891 Unspecified atrial fibrillation: Secondary | ICD-10-CM | POA: Diagnosis not present

## 2018-05-21 DIAGNOSIS — Z7901 Long term (current) use of anticoagulants: Secondary | ICD-10-CM

## 2018-05-21 DIAGNOSIS — Z5181 Encounter for therapeutic drug level monitoring: Secondary | ICD-10-CM | POA: Diagnosis not present

## 2018-05-21 LAB — POCT INR: INR: 2.4 (ref 2.0–3.0)

## 2018-06-17 ENCOUNTER — Telehealth: Payer: Self-pay

## 2018-06-17 NOTE — Telephone Encounter (Signed)
This encounter was created in error - please disregard.

## 2018-06-17 NOTE — Telephone Encounter (Signed)

## 2018-06-18 ENCOUNTER — Ambulatory Visit (INDEPENDENT_AMBULATORY_CARE_PROVIDER_SITE_OTHER): Payer: PRIVATE HEALTH INSURANCE | Admitting: Pharmacist

## 2018-06-18 ENCOUNTER — Other Ambulatory Visit: Payer: Self-pay

## 2018-06-18 DIAGNOSIS — Z5181 Encounter for therapeutic drug level monitoring: Secondary | ICD-10-CM

## 2018-06-18 DIAGNOSIS — Z7901 Long term (current) use of anticoagulants: Secondary | ICD-10-CM

## 2018-06-18 DIAGNOSIS — I4891 Unspecified atrial fibrillation: Secondary | ICD-10-CM

## 2018-06-18 LAB — POCT INR: INR: 2.3 (ref 2.0–3.0)

## 2018-06-19 ENCOUNTER — Other Ambulatory Visit: Payer: Self-pay | Admitting: Cardiovascular Disease

## 2018-06-21 ENCOUNTER — Other Ambulatory Visit: Payer: Self-pay | Admitting: Cardiovascular Disease

## 2018-06-21 NOTE — Telephone Encounter (Signed)
New message    *STAT* If patient is at the pharmacy, call can be transferred to refill team.   1. Which medications need to be refilled? (please list name of each medication and dose if known) warfarin (COUMADIN) 5 MG tablet  2. Which pharmacy/location (including street and city if local pharmacy) is medication to be sent to?[facility] [address] [place] - [address]  3. Do they need a 30 day or 90 day supply? 90

## 2018-06-21 NOTE — Telephone Encounter (Signed)
Rx already sent in today.  °

## 2018-07-20 ENCOUNTER — Telehealth: Payer: Self-pay

## 2018-07-20 NOTE — Telephone Encounter (Signed)
lmom for prescreen  

## 2018-07-23 ENCOUNTER — Other Ambulatory Visit: Payer: Self-pay

## 2018-07-23 ENCOUNTER — Ambulatory Visit (INDEPENDENT_AMBULATORY_CARE_PROVIDER_SITE_OTHER): Payer: PRIVATE HEALTH INSURANCE | Admitting: *Deleted

## 2018-07-23 DIAGNOSIS — Z7901 Long term (current) use of anticoagulants: Secondary | ICD-10-CM | POA: Diagnosis not present

## 2018-07-23 DIAGNOSIS — Z5181 Encounter for therapeutic drug level monitoring: Secondary | ICD-10-CM

## 2018-07-23 DIAGNOSIS — I4891 Unspecified atrial fibrillation: Secondary | ICD-10-CM | POA: Diagnosis not present

## 2018-07-23 LAB — POCT INR: INR: 2.7 (ref 2.0–3.0)

## 2018-07-23 NOTE — Patient Instructions (Signed)
Description   Instructed pt and son for pt to continue taking 1 tablet every day except 1/2 tablet only on Mondays.  Recheck INR in 6 weeks.  Call us with any medication changes or concerns # [phone] [facility]. Pt/son stated they are not interested in eliquis at this time.

## 2018-08-31 ENCOUNTER — Telehealth: Payer: Self-pay

## 2018-08-31 NOTE — Telephone Encounter (Signed)
lmom for prescreen  

## 2018-09-03 ENCOUNTER — Other Ambulatory Visit: Payer: Self-pay

## 2018-09-03 ENCOUNTER — Ambulatory Visit (INDEPENDENT_AMBULATORY_CARE_PROVIDER_SITE_OTHER): Payer: PRIVATE HEALTH INSURANCE | Admitting: *Deleted

## 2018-09-03 DIAGNOSIS — Z7901 Long term (current) use of anticoagulants: Secondary | ICD-10-CM | POA: Diagnosis not present

## 2018-09-03 DIAGNOSIS — I4891 Unspecified atrial fibrillation: Secondary | ICD-10-CM

## 2018-09-03 DIAGNOSIS — Z5181 Encounter for therapeutic drug level monitoring: Secondary | ICD-10-CM

## 2018-09-03 LAB — POCT INR: INR: 2.6 (ref 2.0–3.0)

## 2018-09-03 MED ORDER — WARFARIN SODIUM 5 MG PO TABS: ORAL_TABLET | ORAL | 0 refills | Status: DC

## 2018-09-03 NOTE — Patient Instructions (Signed)
Description   Instructed pt and son for pt to continue taking 1 tablet every day except 1/2 tablet only on Mondays.  Recheck INR in 6 weeks.  Call us with any medication changes or concerns # [phone] [facility].      

## 2018-10-15 ENCOUNTER — Other Ambulatory Visit: Payer: Self-pay

## 2018-10-15 ENCOUNTER — Ambulatory Visit (INDEPENDENT_AMBULATORY_CARE_PROVIDER_SITE_OTHER): Payer: PRIVATE HEALTH INSURANCE

## 2018-10-15 DIAGNOSIS — Z5181 Encounter for therapeutic drug level monitoring: Secondary | ICD-10-CM | POA: Diagnosis not present

## 2018-10-15 DIAGNOSIS — Z7901 Long term (current) use of anticoagulants: Secondary | ICD-10-CM

## 2018-10-15 DIAGNOSIS — I4891 Unspecified atrial fibrillation: Secondary | ICD-10-CM | POA: Diagnosis not present

## 2018-10-15 LAB — POCT INR: INR: 2.5 (ref 2.0–3.0)

## 2018-10-15 NOTE — Patient Instructions (Signed)
Description   Instructed pt and son for pt to continue taking 1 tablet every day except 1/2 tablet only on Mondays.  Recheck INR in 6 weeks.  Call us with any medication changes or concerns # [phone] [facility].

## 2018-11-16 NOTE — Progress Notes (Signed)
Patient ID: Timothy Cantu, male   DOB: 1950/02/21, 68 y.o.   MRN: [id number]     Codie has a history of DCM with EF 15%. It was likely from ETOH. His EF had returned to near  normal with abstinance. He has chronic afib and is on coumadin. He has not had any bleeding problems. He continues to work at [employer]. He had a cath in 2005 after syncope and has no CAD. He has not had recurrent syncope since he stopped drinking.   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  All other systems reviewed and negative  BP (!) 142/80   Pulse 92   Ht 6\' 2"  (1.88 m)   Wt 271 lb (122.9 kg)   SpO2 97%   BMI 34.79 kg/m   General: Affect appropriate Healthy:  appears stated age HEENT: normal Neck supple with no adenopathy JVP normal no bruits no thyromegaly Lungs clear with no wheezing and good diaphragmatic motion Heart:  S1/S2 no murmur, no rub, gallop or click PMI normal Abdomen: benighn, BS positve, no tenderness, no AAA no bruit.  No HSM or HJR Distal pulses intact with no bruits No edema Neuro non-focal Skin warm and dry No muscular weakness    Current Outpatient Medications  Medication Sig Dispense Refill  . acetaminophen (TYLENOL) 325 MG tablet Take 2 tablets (650 mg total) by mouth every 6 (six) hours as needed  for mild pain (or Fever >/= 101).    . carvedilol (COREG) 25 MG tablet Take 1 tablet (25 mg total) by mouth 2 (two) times daily with a meal. 180 tablet 3  . hydrochlorothiazide (HYDRODIURIL) 25 MG tablet Take 1 tablet (25 mg total) by mouth daily. 90 tablet 3  . losartan (COZAAR) 100 MG tablet Take 1 tablet (100 mg total) by mouth daily. 90 tablet 3  . warfarin (COUMADIN) 5 MG tablet Take as directed by the coumadin clinic 90 tablet 0   No current facility-administered medications for this visit.     Allergies  Patient has no known allergies.  Electrocardiogram:  afib rate 92 no acute changes   Assessment and Plan  DCM: resolved EF normal by echo 01/07/18 continue current meds and ETOH abstinence   HTN: Well controlled.  Continue current medications and low sodium Dash type diet.     Afib: good rate control and anticoagulation INR check today refill called in   INR 2.5 on 10/15/18   Syncope:  Related to dehydration and pneumonia non cardiac no recurrence    F/U with me in a year   Jenkins Rouge

## 2018-11-18 ENCOUNTER — Encounter: Payer: Self-pay | Admitting: Cardiovascular Disease

## 2018-11-18 ENCOUNTER — Ambulatory Visit: Payer: PRIVATE HEALTH INSURANCE | Admitting: Cardiovascular Disease

## 2018-11-18 ENCOUNTER — Other Ambulatory Visit: Payer: Self-pay

## 2018-11-18 ENCOUNTER — Ambulatory Visit (INDEPENDENT_AMBULATORY_CARE_PROVIDER_SITE_OTHER): Payer: PRIVATE HEALTH INSURANCE | Admitting: *Deleted

## 2018-11-18 VITALS — BP 142/80 | HR 92 | Ht 74.0 in | Wt 271.0 lb

## 2018-11-18 DIAGNOSIS — I482 Chronic atrial fibrillation, unspecified: Secondary | ICD-10-CM | POA: Diagnosis not present

## 2018-11-18 DIAGNOSIS — I4891 Unspecified atrial fibrillation: Secondary | ICD-10-CM

## 2018-11-18 DIAGNOSIS — Z7901 Long term (current) use of anticoagulants: Secondary | ICD-10-CM

## 2018-11-18 DIAGNOSIS — Z5181 Encounter for therapeutic drug level monitoring: Secondary | ICD-10-CM | POA: Diagnosis not present

## 2018-11-18 DIAGNOSIS — I420 Dilated cardiomyopathy: Secondary | ICD-10-CM | POA: Diagnosis not present

## 2018-11-18 LAB — POCT INR: INR: 2.8 (ref 2.0–3.0)

## 2018-11-18 MED ORDER — HYDROCHLOROTHIAZIDE 25 MG PO TABS: 25.0000 mg | ORAL_TABLET | Freq: Every day | ORAL | 3 refills | Status: DC

## 2018-11-18 MED ORDER — LOSARTAN POTASSIUM 100 MG PO TABS: 100.0000 mg | ORAL_TABLET | Freq: Every day | ORAL | 3 refills | Status: DC

## 2018-11-18 MED ORDER — CARVEDILOL 25 MG PO TABS: 25.0000 mg | ORAL_TABLET | Freq: Two times a day (BID) | ORAL | 3 refills | Status: DC

## 2018-11-18 NOTE — Patient Instructions (Signed)
Description   Instructed pt and son for pt to continue taking 1 tablet every day except 1/2 tablet only on Mondays.  Recheck INR in 7 weeks.  Call us with any medication changes or concerns # [phone] [facility].

## 2018-11-18 NOTE — Patient Instructions (Addendum)

## 2018-11-23 ENCOUNTER — Other Ambulatory Visit: Payer: Self-pay | Admitting: *Deleted

## 2018-11-23 ENCOUNTER — Telehealth: Payer: Self-pay | Admitting: *Deleted

## 2018-11-23 MED ORDER — WARFARIN SODIUM 5 MG PO TABS: ORAL_TABLET | ORAL | 1 refills | Status: DC

## 2018-11-23 NOTE — Telephone Encounter (Signed)
Per communication from Dr. Kyla Balzarine RN Jeannene Patella, the pt will be living in [place] for 6 months and the other 6 months in [place] so Dr. Johnsie Cancel approved for the pt to have 6 month refill on his meds including his Warfarin once he goes to [place]. The pt will be leaving in June 2020 and pt should let us know prior to his departure for 6 months.

## 2019-01-02 ENCOUNTER — Telehealth: Payer: Self-pay | Admitting: Physician Assistant

## 2019-01-02 NOTE — Telephone Encounter (Signed)
Timothy Cantu is a 68 y.o. male with a history of dilated cardiomyopathy and permanent atrial fibrillation.  His EF has returned to normal.  His son called today because his pressure was running low this morning and he was lightheaded.  He has had some cold symptoms with runny nose and sore throat.  He had a Covid test collected yesterday but the results are pending.  He has not had chest pain, shortness of breath, syncope or lower extremity swelling.  Blood pressure this morning was 98/50.  Later was 106/68. PLAN:  I advised him to hold his losartan, carvedilol and HCTZ this morning.  If his pressure is higher this afternoon, he can take his carvedilol.  I advised he drink plenty of fluids and get rest.  If he feels worse, gets short of breath or has a high fever, he should contact his primary care physician to determine if he should go to the emergency room. Richardson Dopp, PA-C    01/02/2019 10:13 AM

## 2019-01-06 ENCOUNTER — Ambulatory Visit (INDEPENDENT_AMBULATORY_CARE_PROVIDER_SITE_OTHER): Payer: PRIVATE HEALTH INSURANCE | Admitting: *Deleted

## 2019-01-06 ENCOUNTER — Other Ambulatory Visit: Payer: Self-pay

## 2019-01-06 DIAGNOSIS — Z7901 Long term (current) use of anticoagulants: Secondary | ICD-10-CM

## 2019-01-06 DIAGNOSIS — I4891 Unspecified atrial fibrillation: Secondary | ICD-10-CM | POA: Diagnosis not present

## 2019-01-06 DIAGNOSIS — Z5181 Encounter for therapeutic drug level monitoring: Secondary | ICD-10-CM | POA: Diagnosis not present

## 2019-01-06 LAB — POCT INR: INR: 1.6 — AB (ref 2.0–3.0)

## 2019-01-06 NOTE — Patient Instructions (Signed)
Description   Instructed pt and son for pt to take 1.5 tablets today and tomorrow, then continue taking 1 tablet every day except 1/2 tablet only on Mondays.  Recheck INR in 2 weeks.  Call us with any medication changes or concerns # [phone] [facility].

## 2019-01-20 ENCOUNTER — Other Ambulatory Visit: Payer: Self-pay

## 2019-01-20 ENCOUNTER — Ambulatory Visit (INDEPENDENT_AMBULATORY_CARE_PROVIDER_SITE_OTHER): Payer: PRIVATE HEALTH INSURANCE

## 2019-01-20 DIAGNOSIS — Z7901 Long term (current) use of anticoagulants: Secondary | ICD-10-CM | POA: Diagnosis not present

## 2019-01-20 DIAGNOSIS — Z5181 Encounter for therapeutic drug level monitoring: Secondary | ICD-10-CM

## 2019-01-20 DIAGNOSIS — I4891 Unspecified atrial fibrillation: Secondary | ICD-10-CM | POA: Diagnosis not present

## 2019-01-20 LAB — POCT INR: INR: 2.9 (ref 2.0–3.0)

## 2019-01-20 NOTE — Patient Instructions (Signed)
Description   Continue on same dosage 1 tablet every day except 1/2 tablet only on Mondays. Recheck INR in 5 weeks. Call us with any medication changes or concerns # [phone] [facility].      

## 2019-02-23 ENCOUNTER — Other Ambulatory Visit: Payer: Self-pay

## 2019-02-23 ENCOUNTER — Ambulatory Visit (INDEPENDENT_AMBULATORY_CARE_PROVIDER_SITE_OTHER): Payer: PRIVATE HEALTH INSURANCE | Admitting: *Deleted

## 2019-02-23 ENCOUNTER — Encounter (INDEPENDENT_AMBULATORY_CARE_PROVIDER_SITE_OTHER): Payer: Self-pay

## 2019-02-23 DIAGNOSIS — Z5181 Encounter for therapeutic drug level monitoring: Secondary | ICD-10-CM | POA: Diagnosis not present

## 2019-02-23 DIAGNOSIS — I4891 Unspecified atrial fibrillation: Secondary | ICD-10-CM | POA: Diagnosis not present

## 2019-02-23 DIAGNOSIS — Z7901 Long term (current) use of anticoagulants: Secondary | ICD-10-CM | POA: Diagnosis not present

## 2019-02-23 LAB — POCT INR: INR: 2.2 (ref 2.0–3.0)

## 2019-02-23 MED ORDER — WARFARIN SODIUM 5 MG PO TABS: ORAL_TABLET | ORAL | 1 refills | Status: DC

## 2019-02-23 NOTE — Patient Instructions (Signed)
Description   Continue on same dosage 1 tablet every day except 1/2 tablet only on Mondays. Recheck INR in 6 weeks. Call us with any medication changes or concerns # [phone] [facility].     

## 2019-04-06 ENCOUNTER — Ambulatory Visit (INDEPENDENT_AMBULATORY_CARE_PROVIDER_SITE_OTHER): Payer: PRIVATE HEALTH INSURANCE | Admitting: Pharmacist

## 2019-04-06 ENCOUNTER — Other Ambulatory Visit: Payer: Self-pay

## 2019-04-06 ENCOUNTER — Encounter (INDEPENDENT_AMBULATORY_CARE_PROVIDER_SITE_OTHER): Payer: Self-pay

## 2019-04-06 DIAGNOSIS — Z7901 Long term (current) use of anticoagulants: Secondary | ICD-10-CM

## 2019-04-06 DIAGNOSIS — Z5181 Encounter for therapeutic drug level monitoring: Secondary | ICD-10-CM

## 2019-04-06 DIAGNOSIS — I4891 Unspecified atrial fibrillation: Secondary | ICD-10-CM

## 2019-04-06 LAB — POCT INR: INR: 2.9 (ref 2.0–3.0)

## 2019-04-06 NOTE — Patient Instructions (Signed)
Description   Continue on same dosage 1 tablet every day except 1/2 tablet only on Mondays. Recheck INR in 6 weeks. Call us with any medication changes or concerns # [phone] [facility].     

## 2019-05-18 ENCOUNTER — Other Ambulatory Visit: Payer: Self-pay

## 2019-05-18 ENCOUNTER — Ambulatory Visit (INDEPENDENT_AMBULATORY_CARE_PROVIDER_SITE_OTHER): Payer: PRIVATE HEALTH INSURANCE | Admitting: *Deleted

## 2019-05-18 DIAGNOSIS — Z5181 Encounter for therapeutic drug level monitoring: Secondary | ICD-10-CM

## 2019-05-18 DIAGNOSIS — I4891 Unspecified atrial fibrillation: Secondary | ICD-10-CM | POA: Diagnosis not present

## 2019-05-18 DIAGNOSIS — Z7901 Long term (current) use of anticoagulants: Secondary | ICD-10-CM | POA: Diagnosis not present

## 2019-05-18 LAB — POCT INR: INR: 2 (ref 2.0–3.0)

## 2019-05-18 MED ORDER — WARFARIN SODIUM 5 MG PO TABS: ORAL_TABLET | ORAL | 0 refills | Status: DC

## 2019-05-18 NOTE — Progress Notes (Signed)
Interpreter present. Informed pt that he will need to get his INR monitored while he is in [place]. Discussed with pharm D about pt switching to Eliquis, however, pt wanting to continue to take Warfarin. Remninded pt that Warfarin is a high risk medication and it requires monitoring. If his INR is out of range he is at a higher risk for blood clots, bleeding or stroke. Pt stated that there are several Dr.'s in [place] and that it would not be a problem to get his INR monitored and he could find a physician while he is there. Put pt's name down in the coumadin clinic's follow up book for Jun 29, 2019, to remind him he will need to get his INR checked and to make sure he has found someone to monitor his warfarin.  Called and spoke with pt's son, Timothy Cantu, who usually comes to appointments with his Dad. Timothy Cantu, stated the best phone number to get in touch with pt is [phone], which actually Timothy Cantu's phone number.   Spoke with pharm D, sent in a 6 month supply of Warfarin since pt will be out of the country for 6 months.

## 2019-05-18 NOTE — Patient Instructions (Addendum)
  Description   Continue on same dosage 1 tablet every day except 1/2 tablet only on Mondays. Please get your INR rechecked on 06/29/2019. Call us with any medication changes or concerns # [phone] [facility].

## 2019-06-28 ENCOUNTER — Telehealth: Payer: Self-pay | Admitting: *Deleted

## 2019-06-28 NOTE — Telephone Encounter (Signed)
Pt's son, Timothy Cantu,  called back and stated that pt found a physician to manage his warfarin while he was in [place]. Pt's son stated that pt got his INR checked yesterday and it was 2.6. Pt's son stated that pt should return to the [place] on December 03, 2019. Informed son that pt should give clinic a call when he returns and we can resume managing his warfarin.

## 2019-06-28 NOTE — Telephone Encounter (Signed)
At last INR visit. Pt stated that he would be going to [place] for 6 months. Informed pt that it was important for him to get his INR checked and monitored while he was there. Called pt, LMOM and attempted to call pt's son [name] at [phone] as a reminder that pt was due to get INR checked.

## 2019-08-23 ENCOUNTER — Telehealth: Payer: Self-pay | Admitting: *Deleted

## 2019-08-23 NOTE — Telephone Encounter (Addendum)
Pt's son called and stated the pt had his INR checked in [place] but the doctor does not tell him what to do when he gets his INR number. Clarified from previous call on 06/28/2019, where the son stated to staff over the phone to our facility that the pt had found a physician in [place] and was being managed from while in [place], and he stated they wrote orders for the pt to have the labs drawn but they don't do things like our clinic. Advised to him that it would be helpful if we had the INR number from their lab to verify lab numbers and that would require faxing it to [place] or calling. He stated that [place] does not work like the facility here and he will try to get that taken care of. Advised that if he wants [place] to mange we will need a hard copy of the lab via fax so we can verify lab values and advise pt on what to do. Pt is not due to return anytime soon and since it will be in October 2021 it would be helpful to have them fax or contact us so we can ensure correct dosing for the pt. Son will call back once he is able to get some information.

## 2019-10-10 ENCOUNTER — Other Ambulatory Visit: Payer: Self-pay | Admitting: Cardiovascular Disease

## 2019-12-06 ENCOUNTER — Other Ambulatory Visit: Payer: Self-pay | Admitting: Cardiovascular Disease

## 2019-12-09 ENCOUNTER — Ambulatory Visit (INDEPENDENT_AMBULATORY_CARE_PROVIDER_SITE_OTHER): Payer: Medicare Other | Admitting: Pharmacist

## 2019-12-09 ENCOUNTER — Ambulatory Visit (INDEPENDENT_AMBULATORY_CARE_PROVIDER_SITE_OTHER): Payer: Medicare Other | Admitting: Cardiovascular Disease

## 2019-12-09 ENCOUNTER — Encounter: Payer: Self-pay | Admitting: Cardiovascular Disease

## 2019-12-09 ENCOUNTER — Other Ambulatory Visit: Payer: Self-pay

## 2019-12-09 VITALS — BP 114/78 | HR 92 | Ht 74.0 in | Wt 271.0 lb

## 2019-12-09 DIAGNOSIS — Z5181 Encounter for therapeutic drug level monitoring: Secondary | ICD-10-CM | POA: Diagnosis not present

## 2019-12-09 DIAGNOSIS — I4811 Longstanding persistent atrial fibrillation: Secondary | ICD-10-CM

## 2019-12-09 DIAGNOSIS — I4891 Unspecified atrial fibrillation: Secondary | ICD-10-CM | POA: Diagnosis not present

## 2019-12-09 LAB — POCT INR
INR: 3.1 — AB (ref 2.0–3.0)
INR: 3.1 — AB (ref 2–3)

## 2019-12-09 MED ORDER — HYDROCHLOROTHIAZIDE 25 MG PO TABS: 25.0000 mg | ORAL_TABLET | Freq: Every day | ORAL | 3 refills | Status: DC

## 2019-12-09 MED ORDER — CARVEDILOL 25 MG PO TABS: 25.0000 mg | ORAL_TABLET | Freq: Two times a day (BID) | ORAL | 3 refills | Status: DC

## 2019-12-09 MED ORDER — LOSARTAN POTASSIUM 100 MG PO TABS: 100.0000 mg | ORAL_TABLET | Freq: Every day | ORAL | 3 refills | Status: DC

## 2019-12-09 NOTE — Patient Instructions (Signed)
Take 1/2 tablet today then continue on same dosage 1 tablet every day except 1/2 tablet only on Mondays. Recheck INR in 4 weeks. Call us with any medication changes or concerns # [phone] [facility].

## 2019-12-09 NOTE — Progress Notes (Signed)
Patient ID: Timothy Cantu, male   DOB: 10-02-1950, 69 y.o.   MRN: [id number]     Timothy Cantu has a history of DCM with EF 15%. It was likely from ETOH. His EF had returned to near  normal with abstinance. He has chronic afib and is on coumadin. He has not had any bleeding problems. He continues to work at [employer]. He had a cath in 2005 after syncope and has no CAD. He has not had recurrent syncope since he stopped drinking.   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  All other systems reviewed and negative  BP 114/78   Pulse 92   Ht 6\' 2"  (1.88 m)   Wt 271 lb (122.9 kg)   SpO2 96%   BMI 34.79 kg/m   General: Affect appropriate Healthy:  appears stated age HEENT: normal Neck supple with no adenopathy JVP normal no bruits no thyromegaly Lungs clear with no wheezing and good diaphragmatic motion Heart:  S1/S2 no murmur, no rub, gallop or click PMI normal Abdomen: benighn, BS positve, no tenderness, no AAA no bruit.  No HSM or HJR Distal pulses intact with no bruits No edema Neuro non-focal Skin warm and dry No muscular  weakness    Current Outpatient Medications  Medication Sig Dispense Refill  . acetaminophen (TYLENOL) 325 MG tablet Take 2 tablets (650 mg total) by mouth every 6 (six) hours as needed for mild pain (or Fever >/= 101).    . carvedilol (COREG) 25 MG tablet Take 1 tablet (25 mg total) by mouth 2 (two) times daily with a meal. 180 tablet 3  . hydrochlorothiazide (HYDRODIURIL) 25 MG tablet Take 1 tablet (25 mg total) by mouth daily. 90 tablet 3  . losartan (COZAAR) 100 MG tablet Take 1 tablet (100 mg total) by mouth daily. 90 tablet 3  . warfarin (COUMADIN) 5 MG tablet Take as directed by the coumadin clinic 200 tablet 0   No current facility-administered medications for this visit.    Allergies  Patient has no known allergies.  Electrocardiogram:  afib rate 92 no acute changes   Assessment and Plan  DCM: resolved EF normal by echo 01/07/18 continue current meds and ETOH abstinence will Update echo next year   HTN: Well controlled.  Continue current medications and low sodium Dash type diet.     Afib: good rate control and anticoagulation INR check today refill called in   INR 2.0 on 05/18/19  I did review labs from 05/20/19 and they are Rx but not clear that these results have made it To our coumadin clinic ? Son calling our Pharm  D with results   Syncope:  Related to dehydration and pneumonia non cardiac no recurrence    F/U with me in a year   Timothy Cantu

## 2019-12-09 NOTE — Progress Notes (Signed)
Patient wife did show me a lab INR from [place] from 10/1. INR was 3.1 (same as today) Discussed Eliquis. They did not seem interested.

## 2019-12-09 NOTE — Patient Instructions (Signed)
Medication Instructions:  *If you need a refill on your cardiac medications before your next appointment, please call your pharmacy*  Lab Work: If you have labs (blood work) drawn today and your tests are completely normal, you will receive your results only by: . MyChart Message (if you have MyChart) OR . A paper copy in the mail If you have any lab test that is abnormal or we need to change your treatment, we will call you to review the results.  Follow-Up: At [facility], you and your health needs are our priority.  As part of our continuing mission to provide you with exceptional heart care, we have created designated Provider Care Teams.  These Care Teams include your primary Cardiologist (physician) and Advanced Practice Providers (APPs -  Physician Assistants and Nurse Practitioners) who all work together to provide you with the care you need, when you need it.  We recommend signing up for the patient portal called "MyChart".  Sign up information is provided on this After Visit Summary.  MyChart is used to connect with patients for Virtual Visits (Telemedicine).  Patients are able to view lab/test results, encounter notes, upcoming appointments, etc.  Non-urgent messages can be sent to your provider as well.   To learn more about what you can do with MyChart, go to [email or web address].    Your next appointment:   6 month(s)  The format for your next appointment:   In Person  Provider:   You may see Peter Nishan, MD or one of the following Advanced Practice Providers on your designated Care Team:    Lori Gerhardt, NP  Laura Ingold, NP  Jill McDaniel, NP    

## 2019-12-12 NOTE — Progress Notes (Signed)
?   Need to clsoe

## 2019-12-12 NOTE — Progress Notes (Signed)
How am I supposed to close this chart ? Coumadin visit

## 2019-12-12 NOTE — Progress Notes (Signed)
This encounter was created in error - please disregard.  This encounter was created in error - please disregard.

## 2020-01-06 ENCOUNTER — Ambulatory Visit: Payer: Medicare Other

## 2020-01-06 ENCOUNTER — Other Ambulatory Visit: Payer: Self-pay

## 2020-01-06 DIAGNOSIS — I4891 Unspecified atrial fibrillation: Secondary | ICD-10-CM

## 2020-01-06 DIAGNOSIS — Z5181 Encounter for therapeutic drug level monitoring: Secondary | ICD-10-CM

## 2020-01-06 LAB — POCT INR: INR: 2.7 (ref 2.0–3.0)

## 2020-01-06 NOTE — Patient Instructions (Addendum)
  Description   Continue on same dosage 1 tablet every day except 1/2 tablet only on Mondays. Recheck INR in 4 weeks. Call us with any medication changes or concerns # [phone] [facility].

## 2020-02-03 ENCOUNTER — Ambulatory Visit: Payer: Medicare Other | Admitting: Pharmacist

## 2020-02-03 ENCOUNTER — Encounter (INDEPENDENT_AMBULATORY_CARE_PROVIDER_SITE_OTHER): Payer: Self-pay

## 2020-02-03 ENCOUNTER — Other Ambulatory Visit: Payer: Self-pay

## 2020-02-03 DIAGNOSIS — Z5181 Encounter for therapeutic drug level monitoring: Secondary | ICD-10-CM

## 2020-02-03 DIAGNOSIS — I4891 Unspecified atrial fibrillation: Secondary | ICD-10-CM | POA: Diagnosis not present

## 2020-02-03 LAB — POCT INR: INR: 3.5 — AB (ref 2.0–3.0)

## 2020-02-03 NOTE — Patient Instructions (Signed)
Description   Hold dose today and eat more green vegetables then continue on same dosage 1 tablet every day except 1/2 tablet only on Mondays. Recheck INR in 3 weeks. Call us with any medication changes or concerns # [phone] [facility].

## 2020-02-24 ENCOUNTER — Other Ambulatory Visit: Payer: Self-pay

## 2020-02-24 ENCOUNTER — Ambulatory Visit (INDEPENDENT_AMBULATORY_CARE_PROVIDER_SITE_OTHER): Payer: Medicare Other | Admitting: *Deleted

## 2020-02-24 DIAGNOSIS — Z5181 Encounter for therapeutic drug level monitoring: Secondary | ICD-10-CM | POA: Diagnosis not present

## 2020-02-24 DIAGNOSIS — I4891 Unspecified atrial fibrillation: Secondary | ICD-10-CM

## 2020-02-24 LAB — POCT INR: INR: 2.2 (ref 2.0–3.0)

## 2020-03-08 ENCOUNTER — Other Ambulatory Visit: Payer: Self-pay | Admitting: Cardiovascular Disease

## 2020-03-23 ENCOUNTER — Ambulatory Visit: Payer: Medicare Other | Admitting: *Deleted

## 2020-03-23 ENCOUNTER — Encounter (INDEPENDENT_AMBULATORY_CARE_PROVIDER_SITE_OTHER): Payer: Self-pay

## 2020-03-23 ENCOUNTER — Other Ambulatory Visit: Payer: Self-pay

## 2020-03-23 DIAGNOSIS — Z5181 Encounter for therapeutic drug level monitoring: Secondary | ICD-10-CM | POA: Diagnosis not present

## 2020-03-23 DIAGNOSIS — I4891 Unspecified atrial fibrillation: Secondary | ICD-10-CM | POA: Diagnosis not present

## 2020-03-23 LAB — POCT INR: INR: 2.3 (ref 2.0–3.0)

## 2020-03-23 NOTE — Patient Instructions (Signed)
Description   Continue on same dosage 1 tablet every day except 1/2 tablet only on Mondays. Recheck INR in 5 weeks. Call us with any medication changes or concerns # [phone] [facility].

## 2020-04-27 ENCOUNTER — Ambulatory Visit (INDEPENDENT_AMBULATORY_CARE_PROVIDER_SITE_OTHER): Payer: Medicare Other | Admitting: *Deleted

## 2020-04-27 ENCOUNTER — Other Ambulatory Visit: Payer: Self-pay

## 2020-04-27 DIAGNOSIS — I4891 Unspecified atrial fibrillation: Secondary | ICD-10-CM | POA: Diagnosis not present

## 2020-04-27 DIAGNOSIS — Z5181 Encounter for therapeutic drug level monitoring: Secondary | ICD-10-CM

## 2020-04-27 LAB — POCT INR: INR: 2.7 (ref 2.0–3.0)

## 2020-04-27 NOTE — Patient Instructions (Signed)
Description   Continue on same dosage 1 tablet every day except 1/2 tablet only on Mondays. Recheck INR in 6 weeks. Call us with any medication changes or concerns # [phone] [facility].

## 2020-05-09 ENCOUNTER — Telehealth: Payer: Self-pay | Admitting: Cardiovascular Disease

## 2020-05-09 DIAGNOSIS — I482 Chronic atrial fibrillation, unspecified: Secondary | ICD-10-CM

## 2020-05-09 DIAGNOSIS — I420 Dilated cardiomyopathy: Secondary | ICD-10-CM

## 2020-05-09 NOTE — Telephone Encounter (Signed)
Son on dpr Returned call to son and informed that Metformin is NOT causing pt's low BPs and that it is only coincidence. Pt experiences light headedness when BP low. Pt denies syncope, chest pain and shortness of breath.  Son reports pt did change is dies in last few weeks after his PCP appt. Son advised that this may be contributory. Son advised that pt is also on medications that are more likely the culprit.   Aware I will forward to Dr. Eden Emms and his nurse to review advise. Son aware Dr. Fabio Bering nurse will call once reviewed/advised upon. Son is agreeable to plan.

## 2020-05-09 NOTE — Telephone Encounter (Signed)
  Pt c/o BP issue: STAT if pt c/o blurred vision, one-sided weakness or slurred speech  1. What are your last 5 BP readings?  Tuesday 05/08/20: 6:00 am: 108/68 ( took medication Monday night) 10:00 am: 89/57 5:00 pm: 118/70  Wednesday 05/09/20: 6:00 am: 120/72  8:00 am: 131/75  2. Are you having any other symptoms (ex. Dizziness, headache, blurred vision, passed out)? Pt had headache when BP was low  3. What is your BP issue? PT thinks Metformin is causing his BP to drop   Pt c/o medication issue:  1. Name of Medication: Metformin 500 mg once daily  2. How are you currently taking this medication (dosage and times per day)? 500 mg once daily at dinner  3. Are you having a reaction (difficulty breathing--STAT)?   4. What is your medication issue? Patient noticed that taking this medication caused his BP to drop.   The patient decided to hold the medication on his own and his BP went back to normal. His son reached out to the PCP and explained the situation and what the patient did, and the PCP did not think that the Metformin would cause a BP drop and advised the pt to take the medication again. After taking the medication his BP did drop

## 2020-05-09 NOTE — Telephone Encounter (Signed)
Should have Hct/Hb and BNP can decrease losartan to 50 mg and HCTZ to 12.5 mg

## 2020-05-10 MED ORDER — HYDROCHLOROTHIAZIDE 12.5 MG PO TABS
12.5000 mg | ORAL_TABLET | Freq: Every day | ORAL | 3 refills | Status: DC
Start: 2020-05-10 — End: 2021-02-27

## 2020-05-10 MED ORDER — LOSARTAN POTASSIUM 50 MG PO TABS
50.0000 mg | ORAL_TABLET | Freq: Every day | ORAL | 3 refills | Status: DC
Start: 2020-05-10 — End: 2021-02-27

## 2020-05-10 NOTE — Telephone Encounter (Signed)
Called patient's son back about medications changes and lab work. Patient's son stated that patient recently had lab work and he will fax our office a copy of lab work. Informed patient's son that we might still need to get lab work if the labs we need are not included it what he had done. Patient's son verbalized understanding.

## 2020-05-16 NOTE — Telephone Encounter (Signed)
Would do BMET/BNP if not done

## 2020-05-16 NOTE — Telephone Encounter (Signed)
Patient will come in tomorrow for lab work tomorrow.

## 2020-05-16 NOTE — Telephone Encounter (Signed)
Received lab work done on 04/24/20. Hemoglobin and Hematocrit are 16 and 48.8. No BNP . Will have lab work scanned into patient's chart.

## 2020-05-16 NOTE — Addendum Note (Signed)
Addended by: Virl Axe, Amante Fomby L on: 05/16/2020 11:40 AM   Modules accepted: Orders

## 2020-05-17 ENCOUNTER — Other Ambulatory Visit: Payer: Self-pay

## 2020-05-17 ENCOUNTER — Other Ambulatory Visit: Payer: Medicare Other | Admitting: *Deleted

## 2020-05-17 DIAGNOSIS — I420 Dilated cardiomyopathy: Secondary | ICD-10-CM

## 2020-05-17 DIAGNOSIS — I482 Chronic atrial fibrillation, unspecified: Secondary | ICD-10-CM

## 2020-05-18 ENCOUNTER — Telehealth: Payer: Self-pay | Admitting: Cardiovascular Disease

## 2020-05-18 LAB — BASIC METABOLIC PANEL
BUN/Creatinine Ratio: 13 (ref 10–24)
BUN: 16 mg/dL (ref 8–27)
CO2: 20 mmol/L (ref 20–29)
Calcium: 9.7 mg/dL (ref 8.6–10.2)
Chloride: 100 mmol/L (ref 96–106)
Creatinine, Ser: 1.25 mg/dL (ref 0.76–1.27)
Glucose: 131 mg/dL — ABNORMAL HIGH (ref 65–99)
Potassium: 4.2 mmol/L (ref 3.5–5.2)
Sodium: 138 mmol/L (ref 134–144)
eGFR: 62 mL/min/{1.73_m2} (ref >59)

## 2020-05-18 LAB — PRO B NATRIURETIC PEPTIDE: NT-Pro BNP: 465 pg/mL — ABNORMAL HIGH (ref 0–376)

## 2020-05-18 NOTE — Telephone Encounter (Signed)
Pt returning call to [facility] about results. Please advise Pt can be reached after 2:30 pm [phone]

## 2020-05-18 NOTE — Telephone Encounter (Signed)
Called patient's son with lab results. Patient's son stated with medication changes patient's BP has been good.

## 2020-05-20 ENCOUNTER — Encounter (HOSPITAL_COMMUNITY)
Admission: EM | Disposition: A | Discharge status: Home / Self Care | Payer: Self-pay | Source: Home / Self Care | Attending: Emergency Medicine

## 2020-05-20 ENCOUNTER — Encounter (HOSPITAL_BASED_OUTPATIENT_CLINIC_OR_DEPARTMENT_OTHER): Payer: Self-pay | Admitting: *Deleted

## 2020-05-20 ENCOUNTER — Emergency Department (HOSPITAL_COMMUNITY): Payer: Medicare Other | Admitting: Certified Registered Nurse Anesthetist

## 2020-05-20 ENCOUNTER — Emergency Department (HOSPITAL_COMMUNITY): Payer: Medicare Other

## 2020-05-20 ENCOUNTER — Other Ambulatory Visit: Payer: Self-pay

## 2020-05-20 ENCOUNTER — Emergency Department (HOSPITAL_BASED_OUTPATIENT_CLINIC_OR_DEPARTMENT_OTHER): Payer: Medicare Other

## 2020-05-20 ENCOUNTER — Observation Stay (HOSPITAL_BASED_OUTPATIENT_CLINIC_OR_DEPARTMENT_OTHER)
Admission: EM | Admit: 2020-05-20 | Discharge: 2020-05-21 | Disposition: A | Discharge status: Home / Self Care | Payer: Medicare Other | Attending: Urology | Admitting: Urology

## 2020-05-20 ENCOUNTER — Encounter (HOSPITAL_COMMUNITY): Payer: Self-pay | Admitting: Emergency Medicine

## 2020-05-20 ENCOUNTER — Encounter (HOSPITAL_COMMUNITY): Payer: Self-pay

## 2020-05-20 DIAGNOSIS — I4891 Unspecified atrial fibrillation: Secondary | ICD-10-CM | POA: Insufficient documentation

## 2020-05-20 DIAGNOSIS — I509 Heart failure, unspecified: Secondary | ICD-10-CM | POA: Insufficient documentation

## 2020-05-20 DIAGNOSIS — E119 Type 2 diabetes mellitus without complications: Secondary | ICD-10-CM | POA: Insufficient documentation

## 2020-05-20 DIAGNOSIS — Z87891 Personal history of nicotine dependence: Secondary | ICD-10-CM | POA: Diagnosis not present

## 2020-05-20 DIAGNOSIS — Z79899 Other long term (current) drug therapy: Secondary | ICD-10-CM | POA: Diagnosis not present

## 2020-05-20 DIAGNOSIS — I110 Hypertensive heart disease with heart failure: Secondary | ICD-10-CM | POA: Diagnosis not present

## 2020-05-20 DIAGNOSIS — N492 Inflammatory disorders of scrotum: Secondary | ICD-10-CM | POA: Diagnosis not present

## 2020-05-20 DIAGNOSIS — Z7901 Long term (current) use of anticoagulants: Secondary | ICD-10-CM | POA: Insufficient documentation

## 2020-05-20 DIAGNOSIS — K8020 Calculus of gallbladder without cholecystitis without obstruction: Secondary | ICD-10-CM | POA: Diagnosis not present

## 2020-05-20 DIAGNOSIS — Z20822 Contact with and (suspected) exposure to covid-19: Secondary | ICD-10-CM | POA: Insufficient documentation

## 2020-05-20 DIAGNOSIS — I700 Atherosclerosis of aorta: Secondary | ICD-10-CM | POA: Diagnosis not present

## 2020-05-20 DIAGNOSIS — N5089 Other specified disorders of the male genital organs: Secondary | ICD-10-CM

## 2020-05-20 DIAGNOSIS — R55 Syncope and collapse: Secondary | ICD-10-CM

## 2020-05-20 HISTORY — DX: Essential (primary) hypertension: I10

## 2020-05-20 HISTORY — PX: INCISION AND DRAINAGE ABSCESS: SHX5864

## 2020-05-20 HISTORY — DX: Acute myocardial infarction, unspecified: I21.9

## 2020-05-20 LAB — CBC WITH DIFFERENTIAL/PLATELET
Abs Immature Granulocytes: 0.13 10*3/uL — ABNORMAL HIGH (ref 0.00–0.07)
Basophils Absolute: 0 10*3/uL (ref 0.0–0.1)
Basophils Relative: 0 %
Eosinophils Absolute: 0 10*3/uL (ref 0.0–0.5)
Eosinophils Relative: 0 %
HCT: 47.1 % (ref 39.0–52.0)
Hemoglobin: 15.8 g/dL (ref 13.0–17.0)
Immature Granulocytes: 1 %
Lymphocytes Relative: 12 %
Lymphs Abs: 2.1 10*3/uL (ref 0.7–4.0)
MCH: 29.8 pg (ref 26.0–34.0)
MCHC: 33.5 g/dL (ref 30.0–36.0)
MCV: 88.7 fL (ref 80.0–100.0)
Monocytes Absolute: 1.8 10*3/uL — ABNORMAL HIGH (ref 0.1–1.0)
Monocytes Relative: 10 %
Neutro Abs: 14.4 10*3/uL — ABNORMAL HIGH (ref 1.7–7.7)
Neutrophils Relative %: 77 %
Platelets: 160 10*3/uL (ref 150–400)
RBC: 5.31 MIL/uL (ref 4.22–5.81)
RDW: 15.3 % (ref 11.5–15.5)
WBC: 18.6 10*3/uL — ABNORMAL HIGH (ref 4.0–10.5)
nRBC: 0 % (ref 0.0–0.2)

## 2020-05-20 LAB — COMPREHENSIVE METABOLIC PANEL
ALT: 23 U/L (ref 0–44)
AST: 20 U/L (ref 15–41)
Albumin: 4.2 g/dL (ref 3.5–5.0)
Alkaline Phosphatase: 47 U/L (ref 38–126)
Anion gap: 10 (ref 5–15)
BUN: 20 mg/dL (ref 8–23)
CO2: 23 mmol/L (ref 22–32)
Calcium: 9.2 mg/dL (ref 8.9–10.3)
Chloride: 99 mmol/L (ref 98–111)
Creatinine, Ser: 1.41 mg/dL — ABNORMAL HIGH (ref 0.61–1.24)
GFR, Estimated: 54 mL/min — ABNORMAL LOW (ref >60)
Glucose, Bld: 197 mg/dL — ABNORMAL HIGH (ref 70–99)
Potassium: 3.8 mmol/L (ref 3.5–5.1)
Sodium: 132 mmol/L — ABNORMAL LOW (ref 135–145)
Total Bilirubin: 1.4 mg/dL — ABNORMAL HIGH (ref 0.3–1.2)
Total Protein: 7.4 g/dL (ref 6.5–8.1)

## 2020-05-20 LAB — URINALYSIS, ROUTINE W REFLEX MICROSCOPIC
Bilirubin Urine: NEGATIVE
Glucose, UA: NEGATIVE mg/dL
Ketones, ur: NEGATIVE mg/dL
Leukocytes,Ua: NEGATIVE
Nitrite: NEGATIVE
Protein, ur: NEGATIVE mg/dL
Specific Gravity, Urine: 1.02 (ref 1.005–1.030)
pH: 5.5 (ref 5.0–8.0)

## 2020-05-20 LAB — GLUCOSE, CAPILLARY
Glucose-Capillary: 125 mg/dL — ABNORMAL HIGH (ref 70–99)
Glucose-Capillary: 413 mg/dL — ABNORMAL HIGH (ref 70–99)

## 2020-05-20 LAB — TROPONIN I (HIGH SENSITIVITY)
Troponin I (High Sensitivity): 10 ng/L (ref <18)
Troponin I (High Sensitivity): 10 ng/L (ref <18)

## 2020-05-20 LAB — RESP PANEL BY RT-PCR (FLU A&B, COVID) ARPGX2
Influenza A by PCR: NEGATIVE
Influenza B by PCR: NEGATIVE
SARS Coronavirus 2 by RT PCR: NEGATIVE

## 2020-05-20 LAB — PROTIME-INR
INR: 2 — ABNORMAL HIGH (ref 0.8–1.2)
Prothrombin Time: 21.8 seconds — ABNORMAL HIGH (ref 11.4–15.2)

## 2020-05-20 LAB — CBG MONITORING, ED: Glucose-Capillary: 194 mg/dL — ABNORMAL HIGH (ref 70–99)

## 2020-05-20 LAB — URINALYSIS, MICROSCOPIC (REFLEX)

## 2020-05-20 LAB — LACTIC ACID, PLASMA: Lactic Acid, Venous: 1.3 mmol/L (ref 0.5–1.9)

## 2020-05-20 SURGERY — INCISION AND DRAINAGE, ABSCESS
Anesthesia: General | Site: Scrotum

## 2020-05-20 MED ORDER — ONDANSETRON HCL 4 MG/2ML IJ SOLN: 4.0000 mg | Freq: Four times a day (QID) | INTRAMUSCULAR | Status: DC | PRN

## 2020-05-20 MED ORDER — SODIUM CHLORIDE 0.9 % IV BOLUS
500.0000 mL | Freq: Once | INTRAVENOUS | Status: AC
  Administered 2020-05-20: 500 mL via INTRAVENOUS

## 2020-05-20 MED ORDER — SODIUM CHLORIDE 0.9 % IV SOLN
3.0000 g | Freq: Four times a day (QID) | INTRAVENOUS | Status: DC
  Administered 2020-05-20 – 2020-05-21 (×3): 3 g via INTRAVENOUS
  Filled 2020-05-20: qty 2.25
  Filled 2020-05-20: qty 8
  Filled 2020-05-20: qty 2.25
  Filled 2020-05-20: qty 8
  Filled 2020-05-20: qty 2.25

## 2020-05-20 MED ORDER — WARFARIN SODIUM 5 MG PO TABS: 5.0000 mg | ORAL_TABLET | ORAL | Status: DC

## 2020-05-20 MED ORDER — DOCUSATE SODIUM 100 MG PO CAPS
100.0000 mg | ORAL_CAPSULE | Freq: Two times a day (BID) | ORAL | Status: DC
  Administered 2020-05-20 – 2020-05-21 (×2): 100 mg via ORAL
  Filled 2020-05-20 (×3): qty 1

## 2020-05-20 MED ORDER — DEXAMETHASONE SODIUM PHOSPHATE 10 MG/ML IJ SOLN
INTRAMUSCULAR | Status: DC | PRN
  Administered 2020-05-20: 10 mg via INTRAVENOUS

## 2020-05-20 MED ORDER — HYDROMORPHONE HCL 1 MG/ML IJ SOLN: 0.5000 mg | INTRAMUSCULAR | Status: DC | PRN

## 2020-05-20 MED ORDER — BUPIVACAINE HCL (PF) 0.25 % IJ SOLN
INTRAMUSCULAR | Status: DC | PRN
  Administered 2020-05-20: 20 mL

## 2020-05-20 MED ORDER — WARFARIN SODIUM 2.5 MG PO TABS: 2.5000 mg | ORAL_TABLET | Freq: Every day | ORAL | Status: DC

## 2020-05-20 MED ORDER — FENTANYL CITRATE (PF) 100 MCG/2ML IJ SOLN
100.0000 ug | Freq: Once | INTRAMUSCULAR | Status: DC | PRN
  Filled 2020-05-20: qty 2

## 2020-05-20 MED ORDER — ONDANSETRON HCL 4 MG/2ML IJ SOLN: 4.0000 mg | Freq: Once | INTRAMUSCULAR | Status: DC | PRN

## 2020-05-20 MED ORDER — SODIUM CHLORIDE 0.9 % IV SOLN: 2.0000 g | Freq: Once | INTRAVENOUS | Status: DC

## 2020-05-20 MED ORDER — INSULIN ASPART 100 UNIT/ML ~~LOC~~ SOLN: 24.0000 [IU] | Freq: Once | SUBCUTANEOUS | Status: DC

## 2020-05-20 MED ORDER — WARFARIN SODIUM 2.5 MG PO TABS: 2.5000 mg | ORAL_TABLET | ORAL | Status: DC

## 2020-05-20 MED ORDER — PHENYLEPHRINE 40 MCG/ML (10ML) SYRINGE FOR IV PUSH (FOR BLOOD PRESSURE SUPPORT)
PREFILLED_SYRINGE | INTRAVENOUS | Status: DC | PRN
  Administered 2020-05-20 (×2): 80 ug via INTRAVENOUS
  Administered 2020-05-20: 120 ug via INTRAVENOUS

## 2020-05-20 MED ORDER — VANCOMYCIN HCL IN DEXTROSE 1-5 GM/200ML-% IV SOLN
1000.0000 mg | Freq: Once | INTRAVENOUS | Status: AC
  Administered 2020-05-20: 1000 mg via INTRAVENOUS
  Filled 2020-05-20 (×2): qty 200

## 2020-05-20 MED ORDER — IOHEXOL 300 MG/ML  SOLN
100.0000 mL | Freq: Once | INTRAMUSCULAR | Status: AC | PRN
  Administered 2020-05-20: 100 mL via INTRAVENOUS

## 2020-05-20 MED ORDER — FENTANYL CITRATE (PF) 100 MCG/2ML IJ SOLN
50.0000 ug | INTRAMUSCULAR | Status: DC | PRN
  Administered 2020-05-20: 50 ug via INTRAVENOUS
  Filled 2020-05-20: qty 2

## 2020-05-20 MED ORDER — VANCOMYCIN HCL IN DEXTROSE 1-5 GM/200ML-% IV SOLN: 1000.0000 mg | Freq: Once | INTRAVENOUS | Status: DC

## 2020-05-20 MED ORDER — SENNA 8.6 MG PO TABS
1.0000 | ORAL_TABLET | Freq: Two times a day (BID) | ORAL | Status: DC
  Administered 2020-05-20 – 2020-05-21 (×2): 8.6 mg via ORAL
  Filled 2020-05-20 (×2): qty 1

## 2020-05-20 MED ORDER — METRONIDAZOLE IN NACL 5-0.79 MG/ML-% IV SOLN: 500.0000 mg | Freq: Three times a day (TID) | INTRAVENOUS | Status: DC

## 2020-05-20 MED ORDER — FENTANYL CITRATE (PF) 100 MCG/2ML IJ SOLN
INTRAMUSCULAR | Status: DC | PRN
  Administered 2020-05-20 (×2): 50 ug via INTRAVENOUS

## 2020-05-20 MED ORDER — SODIUM CHLORIDE 0.9 % IV SOLN
INTRAVENOUS | Status: DC | PRN
  Administered 2020-05-20: 250 mL via INTRAVENOUS

## 2020-05-20 MED ORDER — VANCOMYCIN HCL 1500 MG/300ML IV SOLN
1500.0000 mg | INTRAVENOUS | Status: DC
  Filled 2020-05-20: qty 300

## 2020-05-20 MED ORDER — SODIUM CHLORIDE 0.9 % IV SOLN
3.0000 g | Freq: Four times a day (QID) | INTRAVENOUS | Status: DC
  Filled 2020-05-20: qty 8

## 2020-05-20 MED ORDER — ONDANSETRON HCL 4 MG/2ML IJ SOLN
INTRAMUSCULAR | Status: DC | PRN
  Administered 2020-05-20: 4 mg via INTRAVENOUS

## 2020-05-20 MED ORDER — SODIUM CHLORIDE 0.45 % IV SOLN: INTRAVENOUS | Status: DC

## 2020-05-20 MED ORDER — PROPOFOL 10 MG/ML IV BOLUS
INTRAVENOUS | Status: DC | PRN
  Administered 2020-05-20: 150 mg via INTRAVENOUS

## 2020-05-20 MED ORDER — CARVEDILOL 25 MG PO TABS
25.0000 mg | ORAL_TABLET | Freq: Two times a day (BID) | ORAL | Status: DC
  Administered 2020-05-21: 25 mg via ORAL
  Filled 2020-05-20: qty 1

## 2020-05-20 MED ORDER — OXYCODONE HCL 5 MG PO TABS: 5.0000 mg | ORAL_TABLET | ORAL | Status: DC | PRN

## 2020-05-20 MED ORDER — WARFARIN - PHARMACIST DOSING INPATIENT: Freq: Every day | Status: DC

## 2020-05-20 MED ORDER — 0.9 % SODIUM CHLORIDE (POUR BTL) OPTIME
TOPICAL | Status: DC | PRN
  Administered 2020-05-20: 1000 mL

## 2020-05-20 MED ORDER — INSULIN ASPART 100 UNIT/ML ~~LOC~~ SOLN
0.0000 [IU] | Freq: Three times a day (TID) | SUBCUTANEOUS | Status: DC
  Administered 2020-05-21: 4 [IU] via SUBCUTANEOUS

## 2020-05-20 MED ORDER — LIDOCAINE HCL (PF) 1 % IJ SOLN
20.0000 mL | Freq: Once | INTRAMUSCULAR | Status: DC
  Filled 2020-05-20: qty 30

## 2020-05-20 MED ORDER — FENTANYL CITRATE (PF) 100 MCG/2ML IJ SOLN: 25.0000 ug | INTRAMUSCULAR | Status: DC | PRN

## 2020-05-20 MED ORDER — OXYCODONE HCL 5 MG/5ML PO SOLN: 5.0000 mg | Freq: Once | ORAL | Status: DC | PRN

## 2020-05-20 MED ORDER — LIDOCAINE HCL (CARDIAC) PF 100 MG/5ML IV SOSY
PREFILLED_SYRINGE | INTRAVENOUS | Status: DC | PRN
  Administered 2020-05-20: 80 mg via INTRAVENOUS

## 2020-05-20 MED ORDER — ONDANSETRON HCL 4 MG PO TABS: 4.0000 mg | ORAL_TABLET | Freq: Four times a day (QID) | ORAL | Status: DC | PRN

## 2020-05-20 MED ORDER — OXYCODONE HCL 5 MG PO TABS: 5.0000 mg | ORAL_TABLET | Freq: Once | ORAL | Status: DC | PRN

## 2020-05-20 MED ORDER — FENTANYL CITRATE (PF) 100 MCG/2ML IJ SOLN
INTRAMUSCULAR | Status: AC
  Filled 2020-05-20: qty 2

## 2020-05-20 MED ORDER — LIDOCAINE 2% (20 MG/ML) 5 ML SYRINGE
INTRAMUSCULAR | Status: AC
  Filled 2020-05-20: qty 5

## 2020-05-20 MED ORDER — LACTATED RINGERS IV SOLN: INTRAVENOUS | Status: DC

## 2020-05-20 MED ORDER — ACETAMINOPHEN 500 MG PO TABS
ORAL_TABLET | ORAL | Status: AC
  Filled 2020-05-20: qty 2

## 2020-05-20 MED ORDER — SODIUM CHLORIDE 0.9 % IV SOLN
2.0000 g | Freq: Once | INTRAVENOUS | Status: AC
  Administered 2020-05-20: 2 g via INTRAVENOUS
  Filled 2020-05-20: qty 20

## 2020-05-20 MED ORDER — BUPIVACAINE HCL 0.25 % IJ SOLN
INTRAMUSCULAR | Status: AC
  Filled 2020-05-20: qty 1

## 2020-05-20 MED ORDER — ACETAMINOPHEN 500 MG PO TABS
1000.0000 mg | ORAL_TABLET | Freq: Four times a day (QID) | ORAL | Status: DC
  Administered 2020-05-20 – 2020-05-21 (×3): 1000 mg via ORAL
  Filled 2020-05-20 (×2): qty 2

## 2020-05-20 MED ORDER — VANCOMYCIN HCL IN DEXTROSE 1-5 GM/200ML-% IV SOLN
1000.0000 mg | Freq: Once | INTRAVENOUS | Status: AC
  Administered 2020-05-20: 1000 mg via INTRAVENOUS
  Filled 2020-05-20: qty 200

## 2020-05-20 MED ORDER — PROPOFOL 10 MG/ML IV BOLUS
INTRAVENOUS | Status: AC
  Filled 2020-05-20: qty 20

## 2020-05-20 SURGICAL SUPPLY — 33 items
ADH SKN CLS APL DERMABOND .7 (GAUZE/BANDAGES/DRESSINGS) ×1
APL SKNCLS STERI-STRIP NONHPOA (GAUZE/BANDAGES/DRESSINGS) ×1
BENZOIN TINCTURE PRP APPL 2/3 (GAUZE/BANDAGES/DRESSINGS) ×2 IMPLANT
BLADE HEX COATED 2.75 (ELECTRODE) ×2 IMPLANT
BLADE SURG 15 STRL LF DISP TIS (BLADE) ×1 IMPLANT
BLADE SURG 15 STRL SS (BLADE) ×2
BNDG GAUZE ELAST 4 BULKY (GAUZE/BANDAGES/DRESSINGS) ×2 IMPLANT
COVER WAND RF STERILE (DRAPES) IMPLANT
DERMABOND ADVANCED (GAUZE/BANDAGES/DRESSINGS) ×1
DERMABOND ADVANCED .7 DNX12 (GAUZE/BANDAGES/DRESSINGS) ×1 IMPLANT
DRAIN PENROSE 0.25X18 (DRAIN) ×2 IMPLANT
DRAIN PENROSE 0.5X18 (DRAIN) ×2 IMPLANT
DRAPE LAPAROTOMY T 98X78 PEDS (DRAPES) ×2 IMPLANT
ELECT REM PT RETURN 15FT ADLT (MISCELLANEOUS) ×2 IMPLANT
GAUZE SPONGE 4X4 12PLY STRL (GAUZE/BANDAGES/DRESSINGS) ×2 IMPLANT
GLOVE SURG ENC TEXT LTX SZ7.5 (GLOVE) ×2 IMPLANT
GOWN STRL REUS W/TWL LRG LVL3 (GOWN DISPOSABLE) ×4 IMPLANT
KIT BASIN OR (CUSTOM PROCEDURE TRAY) ×2 IMPLANT
KIT TURNOVER KIT A (KITS) ×2 IMPLANT
NEEDLE HYPO 22GX1.5 SAFETY (NEEDLE) IMPLANT
NS IRRIG 1000ML POUR BTL (IV SOLUTION) IMPLANT
PACK BASIC VI WITH GOWN DISP (CUSTOM PROCEDURE TRAY) ×2 IMPLANT
PENCIL SMOKE EVACUATOR (MISCELLANEOUS) IMPLANT
SPONGE LAP 4X18 RFD (DISPOSABLE) ×4 IMPLANT
SUPPORT SCROTAL LG STRP (MISCELLANEOUS) ×2 IMPLANT
SUT MNCRL AB 4-0 PS2 18 (SUTURE) ×2 IMPLANT
SUT SILK 0 (SUTURE) ×2
SUT SILK 0 30XBRD TIE 6 (SUTURE) ×1 IMPLANT
SUT VIC AB 3-0 SH 27 (SUTURE) ×2
SUT VIC AB 3-0 SH 27XBRD (SUTURE) ×1 IMPLANT
SYR 20ML LL LF (SYRINGE) ×2 IMPLANT
SYR CONTROL 10ML LL (SYRINGE) IMPLANT
WATER STERILE IRR 1000ML POUR (IV SOLUTION) IMPLANT

## 2020-05-20 NOTE — Op Note (Signed)
Preoperative diagnosis:  - Scrotal abscess  Postoperative diagnosis: - Scrotal abscess  Procedure(s): - Incision and drainage of scrotal abscess  Attending surgeon: Marcine Matar, MD  Resident surgeon: Margette Fast, MD  Anesthesia: General  Complications: None  EBL: 10cc  Specimens: Purulent drainage from abscess for culture  Drain: None  Implants: None  Intraoperative findings:  - Large, contained posterior scrotal abscess with ~40cc purulent drainage. No tracking of wound or involvement of bilateral testicles/spermatic cord  - Wound pack with kerlix moistened with normal saline   Indication:  Timothy Cantu is a 70 y.o. male with history of CHF, CAD/MI, DM (recent HgA1c 7) and atrial fibrillation on Coumadin who presented to the ED today for scrotal pain and swelling x 2 day with imaging demonstrating a 6 x 7cm posterior scrotal abscess due to gas-forming organism. Currently afebrile and hemodynamically stable. No crepitus on exam or evidence of gas outside the abscess on imaging, so there is no current concern for Fournier's gangrene. We recommended proceeding with incision and drainage of scrotal abscess under general anesthesia. Risks and benefits of the procedure were discussed, and informed consented was obtained with the patient's son helping to translate.   Description of procedure: The patient was correctly identified in the pre-operative area and taken to the operating room. A pre-induction timeout was performed, and general anesthesia was induced. SCDs were placed for DVT prophylaxis. Appropriate peri-procedural antibiotics had already been administered. The patient was repositioned in the dorsal lithotomy position. He was prepped and draped in the typical sterile fashion. A surgical timeout was performed confirming the correct patient, procedure, and laterality.  We began by inserting a 16 gauge needle connected to syringe into the posterior scrotum while  aspirating in order to determine the location of the abscess since it was not obvious on physical exam. Aspirated pus was sent for culture. Using a 15 blade scalpel, we then made a ~4cm longitudinal incision in the midline of the posterior scrotum over the abscess. We probed the incision with hemostats until we entered the large abscess cavity and drained a significant amount of purulent discharge. We probed the cavity with our fingers to take down some wound loculations. The abscess was contained with no tracking or involvement of bilateral testicles/spermatic cords. Once all pus had been expressed, we copiously irrigated the wound with normal saline. We then obtained excellent hemostasis with Bovie electrocautery. We packed the wound with kerlix moistened with normal saline. A dressing a scrotal fluffs and jock strap was applied.   The patient was awoken from anesthesia having tolerated the procedure well.  Teaching attestation: Dr. Retta Diones was present for all aspects of the case.   Plan: - Transfer to floor once cleared PACU for monitoring, broad spectrum antibiotics, and wound care

## 2020-05-20 NOTE — Consult Note (Signed)
Medical Consultation  [name] AOZ:[id number] DOB: 05-23-1950 DOA: 05/20/2020 PCP: [name], MD   Requesting physician: Dr. Lindajo Royal Date of consultation: 05/20/20 Reason for consultation: Near syncopal episode  Impression/Recommendations Scrotal Abscess     - per primary team  Near syncopal     - unsure if he had a true syncopal episode; per pt's son, he did not past out. He felt weak and needed to sit down. The patient denies passing out.     - EKG will a fib     - he denies CP, palpitations     - check orthostatics     - glucose is ok, trp negative x 2     - add echo and monitor in tele bed  Dilated Cardiomyopathy     - continue coreg; resume cozaar and HCTZ as BP tolerates     - denies CP     - follow up echo  CKD3a     - at baseline, follow  A fib     - continue coreg     - he is on coumadin; his INR is 2.0; he's going to surgery now, hold coumadin until after surgery  TRH will follow-up again tomorrow. Please contact me if I can be of assistance in the meanwhile. Thank you for this consultation.  Chief Complaint: scrotal pain  HPI:  Timothy Cantu is a 70 y.o. male with medical history significant of DCM, HTN, Afib. Presenting with scrotal swelling and pain. Patient speaks limited [place]. His interview is aided by his son at bedside. The patient started having groin pain when walking about 2 days ago. He noticed the his scrotum was becoming red and starting to swell. He had a similar event 2 year ago. At that time he was able to express puss from the area. This time; however, he was unable to do the same. He did not try any medicines or special cleaning techniques. It continued to swell and the pain increased through this morning. He decided to come to the ED. He denies any other aggravating or alleviating factors.    Of note, when being examined in the ED. Patient became lightheaded and weak. He reports that he remembers the entire event. He needed to sit down  and he recovered.   Pt is FULL CODE.  Review of Systems:  He denies CP, dyspnea, palpitations, fevers, N/V/D. Reports scrotal pain.   Past Medical History:  Diagnosis Date  . Atrial fibrillation (HCC)   . CAP (community acquired pneumonia)   . CHF (congestive heart failure) (HCC)   . Dilated cardiomyopathy (HCC)   . ETOH abuse   . Hypertension   . Long-term (current) use of anticoagulants   . MI (myocardial infarction) (HCC)   . Sciatica   . Syncope 02/2003   pt was out for 5-6 minutes   Past Surgical History:  Procedure Laterality Date  . CARDIAC CATHETERIZATION  02/28/2003   No critical coronary artery disease -- Severe left ventricular dysfunction with probable secondary mitral regurgitation -- Atrial fibrillation with controlled ventricular response  . STOMACH SURGERY     Stomach Surgery? type:  in [place]   Social History:  reports that he quit smoking about 24 years ago. His smoking use included cigarettes. He smoked 20.00 packs per day. He has never used smokeless tobacco. He reports that he does not drink alcohol and does not use drugs.  No Known Allergies Family History  Problem Relation Age of Onset  . Heart  disease Mother        Pt unsure of what kind  . Heart attack Neg Hx     Prior to Admission medications   Medication Sig Start Date End Date Taking? Authorizing Provider  acetaminophen (TYLENOL) 325 MG tablet Take 2 tablets (650 mg total) by mouth every 6 (six) hours as needed for mild pain (or Fever >/= 101). 09/01/17   Meredeth Ide, MD  carvedilol (COREG) 25 MG tablet Take 1 tablet (25 mg total) by mouth 2 (two) times daily with a meal. 12/09/19   Wendall Stade, MD  hydrochlorothiazide (HYDRODIURIL) 12.5 MG tablet Take 1 tablet (12.5 mg total) by mouth daily. 05/10/20   Wendall Stade, MD  losartan (COZAAR) 50 MG tablet Take 1 tablet (50 mg total) by mouth daily. 05/10/20   Wendall Stade, MD  warfarin (COUMADIN) 5 MG tablet Take 1/2 a tablet to 1 tablet  by mouth daily as directed by the coumadin clinic. 03/08/20   Wendall Stade, MD   Physical Exam: Blood pressure 115/65, pulse (!) 105, temperature 97.8 F (36.6 C), temperature source Oral, resp. rate 17, height 6\' 2"  (1.88 m), weight 124.7 kg, SpO2 99 %. Vitals:   05/20/20 1530 05/20/20 1600  BP: 126/73 115/65  Pulse: (!) 115 (!) 105  Resp: 18 17  Temp:    SpO2: 98% 99%    General: 70 y.o. male resting in bed in NAD Eyes: PERRL, normal sclera ENMT: Nares patent w/o discharge, orophaynx clear, dentition normal, ears w/o discharge/lesions/ulcers Neck: Supple, trachea midline Cardiovascular: RRR, +S1, S2, no m/g/r, equal pulses throughout Respiratory: CTABL, no w/r/r, normal WOB GI: BS+, NDNT, no masses noted, no organomegaly noted GU: scrotal swelling and erythema, no drainage noted MSK: No e/c/c Skin: No rashes, bruises, ulcerations noted Neuro: A&O x 3, no focal deficits Psyc: Appropriate interaction and affect, calm/cooperative  Labs on Admission:  Basic Metabolic Panel: Recent Labs  Lab 05/17/20 0911 05/20/20 0924  NA 138 132*  K 4.2 3.8  CL 100 99  CO2 20 23  GLUCOSE 131* 197*  BUN 16 20  CREATININE 1.25 1.41*  CALCIUM 9.7 9.2   Liver Function Tests: Recent Labs  Lab 05/20/20 0924  AST 20  ALT 23  ALKPHOS 47  BILITOT 1.4*  PROT 7.4  ALBUMIN 4.2   No results for input(s): LIPASE, AMYLASE in the last 168 hours. No results for input(s): AMMONIA in the last 168 hours. CBC: Recent Labs  Lab 05/20/20 0924  WBC 18.6*  NEUTROABS 14.4*  HGB 15.8  HCT 47.1  MCV 88.7  PLT 160   Cardiac Enzymes: No results for input(s): CKTOTAL, CKMB, CKMBINDEX, TROPONINI in the last 168 hours. BNP: Invalid input(s): POCBNP CBG: Recent Labs  Lab 05/20/20 0919  GLUCAP 194*    Radiological Exams on Admission: CT ABDOMEN PELVIS W CONTRAST  Result Date: 05/20/2020 CLINICAL DATA:  Scrotal mass with nausea and vomiting as well as scrotal pain and swelling. Syncopal  episode. EXAM: CT ABDOMEN AND PELVIS WITH CONTRAST TECHNIQUE: Multidetector CT imaging of the abdomen and pelvis was performed using the standard protocol following bolus administration of intravenous contrast. CONTRAST:  07/20/2020 OMNIPAQUE IOHEXOL 300 MG/ML  SOLN COMPARISON:  Scrotal ultrasound earlier today. FINDINGS: Lower chest: Lung bases are clear. Hepatobiliary: Single 7 mm gallstone present. Liver and biliary tree are normal. Pancreas: Normal. Spleen: Normal. Adrenals/Urinary Tract: Adrenal glands are normal. Kidneys are normal in size without hydronephrosis or nephrolithiasis. Minimal focal left renal cortical scarring  over the posterior mid to lower pole. Ureters and bladder are normal. Stomach/Bowel: Stomach and small bowel are normal. Appendix is normal. Colon is normal. Vascular/Lymphatic: Calcified plaque over the abdominal aorta which is normal caliber. Retroaortic left renal vein. No adenopathy. Reproductive: Prostate gland is normal. There is a rim enhancing fluid collection with air over its nondependent portion. This is located within scrotum posterosuperior to the testicles and posterior to the penile shaft measuring 5.6 x 6.9 cm in transverse in AP dimension. This correlates with the recent abnormality seen on scrotal ultrasound and likely represents the scrotal abscess. Other: None. Musculoskeletal: No focal abnormality. Degenerative change of the spine. Significant disc disease at the L5-S1 level. IMPRESSION: 1. 5.6 x 6.9 cm rim enhancing fluid collection with air over the scrotum posterosuperior to the testicles and posterior to the penile shaft. This correlates with the recent abnormality seen on scrotal ultrasound and likely represents the scrotal abscess due to gas-forming organism. 2. Single 7 mm gallstone. 3. Minimal focal left renal cortical scarring. 4. Aortic atherosclerosis. Aortic Atherosclerosis (ICD10-I70.0). These results were called by telephone at the time of interpretation on  05/20/2020 at 2:45 pm to provider MATTHEW TRIFAN , who verbally acknowledged these results. Electronically Signed   By: Elberta Fortis M.D.   On: 05/20/2020 14:39   US SCROTUM W/DOPPLER  Result Date: 05/20/2020 CLINICAL DATA:  Severe bilateral testicular pain and swelling 2 days with fever. EXAM: SCROTAL ULTRASOUND DOPPLER ULTRASOUND OF THE TESTICLES TECHNIQUE: Complete ultrasound examination of the testicles, epididymis, and other scrotal structures was performed. Color and spectral Doppler ultrasound were also utilized to evaluate blood flow to the testicles. COMPARISON:  None. FINDINGS: Right testicle Measurements: 5.2 x 2.4 x 3.3 cm. No mass or microlithiasis visualized. Left testicle Measurements: 4.6 x 2.5 x 3.4 cm. No mass or microlithiasis visualized. Right epididymis:  Normal in size and appearance. Left epididymis: 4 mm cyst versus spermatocele over the epididymal head. Hydrocele:  Small bilateral hydroceles. Varicocele:  None visualized. Pulsed Doppler interrogation of both testes demonstrates normal low resistance arterial and venous waveforms bilaterally. Over the midline posterior to the scrotum in the area patient's pain is a hypoechoic/heterogeneous masslike collection with through transmission measuring 5.4 x 7.5 x 7.7 cm. This may represent a solid mass versus complex fluid collection. IMPRESSION: 1. 7.7 cm hypoechoic, heterogeneous masslike collection within the soft tissues posterior to the scrotum correlating to the area patient's pain. This may represent a complex fluid collection/abscess and less likely solid mass. Recommend clinical correlation as CT pelvis with intravenous contrast would be helpful for further evaluation. 2. Normal testicular ultrasound without evidence of focal abnormality or torsion. Small bilateral hydroceles. 4 mm left epididymal cyst. Electronically Signed   By: Elberta Fortis M.D.   On: 05/20/2020 10:43    EKG: Independently reviewed. A fib, no st elevations  Time  spent: 70 minutes  Terrian Ridlon A Deiondra Denley DO [facility]  If 7PM-7AM, please contact night-coverage [email or web address] 05/20/2020, 4:20 PM

## 2020-05-20 NOTE — Progress Notes (Signed)
Patient admitted right before shift change, oriented patient to room/unit. Reported off to night sjift nurse to F/U with plan of care.

## 2020-05-20 NOTE — Progress Notes (Signed)
ANTICOAGULATION CONSULT NOTE - Initial Consult  Pharmacy Consult for Warfarin Indication: atrial fibrillation  No Known Allergies  Patient Measurements: Height: 6' (182.9 cm) (Stated by patient, using the interpreter) Weight: 119.9 kg (264 lb 4.8 oz) IBW/kg (Calculated) : 77.6  Vital Signs: Temp: 98.8 F (37.1 C) (04/03 1842) Temp Source: Oral (04/03 1842) BP: 130/79 (04/03 1842) Pulse Rate: 100 (04/03 1842)  Labs: Recent Labs    05/20/20 0924 05/20/20 1127 05/20/20 1334  HGB 15.8  --   --   HCT 47.1  --   --   PLT 160  --   --   LABPROT  --   --  21.8*  INR  --   --  2.0*  CREATININE 1.41*  --   --   TROPONINIHS 10 10  --     Estimated Creatinine Clearance: 65.2 mL/min (A) (by C-G formula based on SCr of 1.41 mg/dL (H)).   Medical History: Past Medical History:  Diagnosis Date  . Atrial fibrillation (HCC)   . CAP (community acquired pneumonia)   . CHF (congestive heart failure) (HCC)   . Dilated cardiomyopathy (HCC)   . ETOH abuse   . Hypertension   . Long-term (current) use of anticoagulants   . MI (myocardial infarction) (HCC)   . Sciatica   . Syncope 02/2003   pt was out for 5-6 minutes   Assessment: 70 y/o M admitted with a scrotal abscess s/p I&D. Patient is on warfarin chronically for atrial fibrillation. INR is therapeutic on admission. Patient takes 5 mg daily except 2.5 mg on Mondays with last dose 8 am today.   Goal of Therapy:  INR 2-3  Plan:  Will order home dose as a placeholder starting tomorrow and f/u AM INR.   Luisa Hart D 05/20/2020,7:44 PM

## 2020-05-20 NOTE — Transfer of Care (Signed)
Immediate Anesthesia Transfer of Care Note  Patient: Timothy Cantu  Procedure(s) Performed: INCISION AND DRAINAGE ABSCESS (N/A Scrotum)  Patient Location: PACU  Anesthesia Type:General  Level of Consciousness: awake, alert , oriented and patient cooperative  Airway & Oxygen Therapy: Patient Spontanous Breathing  Post-op Assessment: Report given to RN and Post -op Vital signs reviewed and stable  Post vital signs: Reviewed and stable  Last Vitals:  Vitals Value Taken Time  BP 122/65 05/20/20 1740  Temp    Pulse 105 05/20/20 1741  Resp 17 05/20/20 1741  SpO2 95 % 05/20/20 1741  Vitals shown include unvalidated device data.  Last Pain:  Vitals:   05/20/20 1331  TempSrc:   PainSc: 6          Complications: No complications documented.

## 2020-05-20 NOTE — Anesthesia Procedure Notes (Signed)
Procedure Name: LMA Insertion Date/Time: 05/20/2020 5:01 PM Performed by: Yolonda Kida, CRNA Pre-anesthesia Checklist: Patient identified, Emergency Drugs available, Suction available and Patient being monitored Patient Re-evaluated:Patient Re-evaluated prior to induction Oxygen Delivery Method: Circle system utilized Preoxygenation: Pre-oxygenation with 100% oxygen Induction Type: IV induction LMA: LMA inserted LMA Size: 4.0 Number of attempts: 1 Placement Confirmation: positive ETCO2 and breath sounds checked- equal and bilateral Tube secured with: Tape Dental Injury: Teeth and Oropharynx as per pre-operative assessment

## 2020-05-20 NOTE — ED Notes (Signed)
U/s at bedside

## 2020-05-20 NOTE — Anesthesia Postprocedure Evaluation (Signed)
Anesthesia Post Note  Patient: Timothy Cantu  Procedure(s) Performed: INCISION AND DRAINAGE ABSCESS (N/A Scrotum)     Patient location during evaluation: PACU Anesthesia Type: General Level of consciousness: awake and alert and oriented Pain management: pain level controlled Vital Signs Assessment: post-procedure vital signs reviewed and stable Respiratory status: nonlabored ventilation, respiratory function stable and spontaneous breathing Cardiovascular status: blood pressure returned to baseline and stable Postop Assessment: no apparent nausea or vomiting Anesthetic complications: no   No complications documented.  Last Vitals:  Vitals:   05/20/20 1810 05/20/20 1820  BP: (!) 148/97 138/84  Pulse: (!) 101 (!) 105  Resp: (!) 22 20  Temp: 37.8 C   SpO2: 95% 97%    Last Pain:  Vitals:   05/20/20 1820  TempSrc:   PainSc: 0-No pain                 Garrison Michie A.

## 2020-05-20 NOTE — ED Triage Notes (Signed)
Bilateral testicle swelling x several days.   During triage, pts eyes rolled back in his head, pt became unresponsive approximately 20-30 seconds.  Pt did urinate on himself.  Pt diaphoretic. CBG 194. Pt apneic during episode, desat to 77%

## 2020-05-20 NOTE — Progress Notes (Signed)
Pharmacy Antibiotic Note  Timothy Cantu is a 70 y.o. male admitted on 05/20/2020 presenting with scrotal pain/swelling, syncope in triage, sepsis workup.  Pharmacy has been consulted for vancomycin dosing.  Ceftriaxone per MD  Plan: Vancomycin 2000 mg IV x 1, then 1500 mg IV every 24 hours (eAUC 467, Goal AUC 400-550, SCr 1.41) Monitor renal function, Cx and clinical progression to narrow Vancomycin levels as needed  Height: 6\' 2"  (188 cm) Weight: 124.7 kg (275 lb) IBW/kg (Calculated) : 82.2  Temp (24hrs), Avg:97.8 F (36.6 C), Min:97.8 F (36.6 C), Max:97.8 F (36.6 C)  Recent Labs  Lab 05/17/20 0911 05/20/20 0924  WBC  --  18.6*  CREATININE 1.25 1.41*    Estimated Creatinine Clearance: 68.4 mL/min (A) (by C-G formula based on SCr of 1.41 mg/dL (H)).    No Known Allergies  07/20/20, PharmD Clinical Pharmacist ED Pharmacist Phone # [phone] 05/20/2020 11:54 AM

## 2020-05-20 NOTE — ED Triage Notes (Signed)
Sent from [facility] via [facility] for scrotal abscess. Patient is complaining of pain in the testicular area, scrotal swelling and vitals WNL per [facility].

## 2020-05-20 NOTE — ED Provider Notes (Signed)
  This is a 71 year old male on Coumadin for A. fib presenting as a transfer from [facility] with concern for scrotal pain and fever for 2 days.  Ultrasound at [facility] was concerning for scrotal mass.  CT machine was unable to be performed at that time given to machine malfunction.  The patient was transferred to our ED for CT with IV contrast per request of the urologist on call, and urology consult.    Labs reviewed.  The patient did have tachycardia and white blood cell count of 18.6.  He was ordered IV fluids, IV Rocephin, IV vancomycin.  He has received the Rocephin prior to arrival, and is receiving the vancomycin here.  He is afebrile on arrival.  His lactate is 1.3.  I have a lower suspicion for sepsis at this time.  Physical Exam  BP 127/82 (BP Location: Left Arm)   Pulse (!) 102   Temp 97.8 F (36.6 C) (Oral)   Resp (!) 22   Ht 6\' 2"  (1.88 m)   Wt 124.7 kg   SpO2 97%   BMI 35.31 kg/m   Physical Exam NAD HR irregular 90-110 bpm No respiratory distress Scrotal tenderness, no crepitus, no abscess  ED Course/Procedures   Clinical Course as of 05/20/20 1700  Sun May 20, 2020  1355 INR(!): 2.0 [MT]  1427 I contacted the urology resident who will come evaluate the patient.  She has requested bedside I&D equipment [MT]  1525 Pt remains comfortable, vitals stable, notified urology resident about CT findings.  Awaiting plan. [MT]  1534 Pt signed out to Dr [id number] pending urology evaluation and plan for I&D [MT]    Clinical Course User Index [MT] Shivansh Hardaway, Fredderick Phenix, MD    Procedures  MDM   CT scan ordered.  We will consult urology following this.  Clinical Course as of 05/20/20 1700  Sun May 20, 2020  1355 INR(!): 2.0 [MT]  1427 I contacted the urology resident who will come evaluate the patient.  She has requested bedside I&D equipment [MT]  1525 Pt remains comfortable, vitals stable, notified urology resident about CT findings.  Awaiting plan. [MT]  1534 Pt  signed out to Dr [id number] pending urology evaluation and plan for I&D [MT]    Clinical Course User Index [MT] Littleton Haub, Fredderick Phenix, MD         Kermit Balo, MD 05/20/20 1700

## 2020-05-20 NOTE — Progress Notes (Signed)
Pt POC CBG 413. Ordered lab verification per  standing orders and notified MD.

## 2020-05-20 NOTE — ED Provider Notes (Signed)
Emergency Department Provider Note   I have reviewed the triage vital signs and the nursing notes.   HISTORY  Chief Complaint Testicle Pain   HPI Timothy Cantu is a 70 y.o. male with past medical history reviewed below including CHF, atrial fibrillation on Coumadin, presents to the emergency department with his son with 2 days of scrotal pain and swelling.  He denies fevers or chills.  While checking into the emergency department in triage he had a syncope event.  He did not lose pulses.  He was brought immediately back to the acute care area and resuscitation was started.  He regained consciousness very quickly.  He had not been feeling chest pain or shortness of breath, heart palpitations before passing out.  After waking he states he feels fine is just having pain in his scrotum along with swelling.  Denies any lower abdominal or back pain.  No dysuria, hesitancy, urgency.  No injury to the area.  Past Medical History:  Diagnosis Date  . Atrial fibrillation (HCC)   . CAP (community acquired pneumonia)   . CHF (congestive heart failure) (HCC)   . Dilated cardiomyopathy (HCC)   . ETOH abuse   . Douglas Rooks-term (current) use of anticoagulants   . MI (myocardial infarction) (HCC)   . Sciatica   . Syncope 02/2003   pt was out for 5-6 minutes    Patient Active Problem List   Diagnosis Date Noted  . CAP (community acquired pneumonia) 08/31/2017  . Syncope 08/31/2017  . HTN (hypertension) 04/12/2014  . Encounter for therapeutic drug monitoring 03/16/2013  . Lynsee Wands term (current) use of anticoagulants 05/16/2010  . CHF 11/30/2008  . Congestive dilated cardiomyopathy (HCC) 04/27/2008  . ATRIAL FIBRILLATION 04/27/2008  . SCIATICA, ACUTE 04/27/2008    Past Surgical History:  Procedure Laterality Date  . CARDIAC CATHETERIZATION  02/28/2003   No critical coronary artery disease -- Severe left ventricular dysfunction with probable secondary mitral regurgitation -- Atrial fibrillation with  controlled ventricular response  . STOMACH SURGERY     Stomach Surgery? type:  in [place]    Allergies Patient has no known allergies.  Family History  Problem Relation Age of Onset  . Heart disease Mother        Pt unsure of what kind  . Heart attack Neg Hx     Social History Social History   Tobacco Use  . Smoking status: Former Smoker    Packs/day: 20.00    Types: Cigarettes    Quit date: 03/04/1996    Years since quitting: 24.2  . Smokeless tobacco: Never Used  Substance Use Topics  . Alcohol use: No  . Drug use: No    Review of Systems  Constitutional: No fever/chills Eyes: No visual changes. ENT: No sore throat. Cardiovascular: Denies chest pain. Positive syncope in the ED.  Respiratory: Denies shortness of breath. Gastrointestinal: No abdominal pain.  No nausea, no vomiting.  No diarrhea.  No constipation. Genitourinary: Negative for dysuria. Positive scrotal pain and swelling.  Musculoskeletal: Negative for back pain. Skin: Scrotal redness.  Neurological: Negative for headaches, focal weakness or numbness.  10-point ROS otherwise negative.  ____________________________________________   PHYSICAL EXAM:  VITAL SIGNS: Vitals:   05/20/20 1053 05/20/20 1130  BP: 117/67 107/64  Pulse: (!) 107 (!) 106  Resp: (!) 25 (!) 29  Temp:    SpO2: 98% 94%    Constitutional: Alert and oriented. Well appearing and in no acute distress. Eyes: Conjunctivae are normal.  Head: Atraumatic. Nose:  No congestion/rhinnorhea. Mouth/Throat: Mucous membranes are moist.  Neck: No stridor. Cardiovascular: Tachycardia. Good peripheral circulation. Grossly normal heart sounds.   Respiratory: Normal respiratory effort.  No retractions. Lungs CTAB. Gastrointestinal: Soft and nontender. No distention.  Genitourinary: Patient with diffuse, bilateral scrotal swelling with erythema.  No area of opening or ulceration noted.  Musculoskeletal: No gross deformities of  extremities. Neurologic:  Normal speech and language.  Skin:  Skin is warm, dry and intact. No rash noted.  ____________________________________________   LABS (all labs ordered are listed, but only abnormal results are displayed)  Labs Reviewed  COMPREHENSIVE METABOLIC PANEL - Abnormal; Notable for the following components:      Result Value   Sodium 132 (*)    Glucose, Bld 197 (*)    Creatinine, Ser 1.41 (*)    Total Bilirubin 1.4 (*)    GFR, Estimated 54 (*)    All other components within normal limits  CBC WITH DIFFERENTIAL/PLATELET - Abnormal; Notable for the following components:   WBC 18.6 (*)    Neutro Abs 14.4 (*)    Monocytes Absolute 1.8 (*)    Abs Immature Granulocytes 0.13 (*)    All other components within normal limits  URINALYSIS, ROUTINE W REFLEX MICROSCOPIC - Abnormal; Notable for the following components:   Hgb urine dipstick TRACE (*)    All other components within normal limits  URINALYSIS, MICROSCOPIC (REFLEX) - Abnormal; Notable for the following components:   Bacteria, UA RARE (*)    All other components within normal limits  CBG MONITORING, ED - Abnormal; Notable for the following components:   Glucose-Capillary 194 (*)    All other components within normal limits  CULTURE, BLOOD (ROUTINE X 2)  CULTURE, BLOOD (ROUTINE X 2)  RESP PANEL BY RT-PCR (FLU A&B, COVID) ARPGX2  LACTIC ACID, PLASMA  LACTIC ACID, PLASMA  PROTIME-INR  TROPONIN I (HIGH SENSITIVITY)  TROPONIN I (HIGH SENSITIVITY)   ____________________________________________  EKG   EKG Interpretation  Date/Time:  Sunday May 20 2020 09:23:26 EDT Ventricular Rate:  99 PR Interval:    QRS Duration: 92 QT Interval:  334 QTC Calculation: 429 R Axis:   76 Text Interpretation: Atrial fibrillation Low voltage, extremity leads Consider anterior infarct Confirmed by Alona Bene [phone]) on 05/20/2020 11:54:03 AM       ____________________________________________  RADIOLOGY  US SCROTUM  W/DOPPLER  Result Date: 05/20/2020 CLINICAL DATA:  Severe bilateral testicular pain and swelling 2 days with fever. EXAM: SCROTAL ULTRASOUND DOPPLER ULTRASOUND OF THE TESTICLES TECHNIQUE: Complete ultrasound examination of the testicles, epididymis, and other scrotal structures was performed. Color and spectral Doppler ultrasound were also utilized to evaluate blood flow to the testicles. COMPARISON:  None. FINDINGS: Right testicle Measurements: 5.2 x 2.4 x 3.3 cm. No mass or microlithiasis visualized. Left testicle Measurements: 4.6 x 2.5 x 3.4 cm. No mass or microlithiasis visualized. Right epididymis:  Normal in size and appearance. Left epididymis: 4 mm cyst versus spermatocele over the epididymal head. Hydrocele:  Small bilateral hydroceles. Varicocele:  None visualized. Pulsed Doppler interrogation of both testes demonstrates normal low resistance arterial and venous waveforms bilaterally. Over the midline posterior to the scrotum in the area patient's pain is a hypoechoic/heterogeneous masslike collection with through transmission measuring 5.4 x 7.5 x 7.7 cm. This may represent a solid mass versus complex fluid collection. IMPRESSION: 1. 7.7 cm hypoechoic, heterogeneous masslike collection within the soft tissues posterior to the scrotum correlating to the area patient's pain. This may represent a complex fluid collection/abscess and  less likely solid mass. Recommend clinical correlation as CT pelvis with intravenous contrast would be helpful for further evaluation. 2. Normal testicular ultrasound without evidence of focal abnormality or torsion. Small bilateral hydroceles. 4 mm left epididymal cyst. Electronically Signed   By: Elberta Fortis M.D.   On: 05/20/2020 10:43    ____________________________________________   PROCEDURES  Procedure(s) performed:   .Critical Care Performed by: Maia Plan, MD Authorized by: Maia Plan, MD   Critical care provider statement:    Critical care time  (minutes):  45   Critical care time was exclusive of:  Separately billable procedures and treating other patients and teaching time   Critical care was necessary to treat or prevent imminent or life-threatening deterioration of the following conditions:  Sepsis   Critical care was time spent personally by me on the following activities:  Discussions with consultants, evaluation of patient's response to treatment, examination of patient, ordering and performing treatments and interventions, ordering and review of laboratory studies, ordering and review of radiographic studies, pulse oximetry, re-evaluation of patient's condition, obtaining history from patient or surrogate, review of old charts, blood draw for specimens and development of treatment plan with patient or surrogate   I assumed direction of critical care for this patient from another provider in my specialty: no     Care discussed with: accepting provider at another facility       ____________________________________________   INITIAL IMPRESSION / ASSESSMENT AND PLAN / ED COURSE  Pertinent labs & imaging results that were available during my care of the patient were reviewed by me and considered in my medical decision making (see chart for details).   Patient presents to the emergency department for evaluation of scrotal pain and swelling.  The area is diffusely erythematous and tender to touch.  Patient had a syncope event in triage which seems vasovagal initially.  No prodrome.  Vital signs are tachycardic and blood pressures are slightly soft but not hypotensive.  Afebrile.   Patient sent for scrotal ultrasound with concern for possible scrotal abscess.  He is feeling well and vital signs continue to remain within normal limits other than mild tachycardia.  I have broadened his work-up to include blood cultures, lactate, COVID, INR.  I discussed the case with urology on-call, Dahlia Client, who agrees with plan for transfer to the [facility] emergency department.  I have CT scan here but the IV contrast infusion system is malfunctioning and a CT cannot be done with contrast here.   Discussed the case with Dr. Renaye Rakers who accepts in transfer. Can coordinate with Urology once CT is complete.    ____________________________________________  FINAL CLINICAL IMPRESSION(S) / ED DIAGNOSES  Final diagnoses:  Syncope, unspecified syncope type  Scrotal edema     MEDICATIONS GIVEN DURING THIS VISIT:  Medications  lactated ringers infusion (has no administration in time range)  cefTRIAXone (ROCEPHIN) 2 g in sodium chloride 0.9 % 100 mL IVPB (has no administration in time range)  vancomycin (VANCOCIN) IVPB 1000 mg/200 mL premix (has no administration in time range)    Followed by  vancomycin (VANCOCIN) IVPB 1000 mg/200 mL premix (has no administration in time range)  vancomycin (VANCOREADY) IVPB 1500 mg/300 mL (has no administration in time range)  sodium chloride 0.9 % bolus 500 mL (0 mLs Intravenous Stopped 05/20/20 1045)    Note:  This document was prepared using Dragon voice recognition software and may include unintentional dictation errors.  Alona Bene, MD, [facility] Emergency Medicine  Maia Plan, MD 05/20/20 1157

## 2020-05-20 NOTE — ED Provider Notes (Signed)
The urology team has seen the patient and will take the patient to the operating room.  They initially request a hospitalist admission.  I consulted the hospitalist to discuss the patient with the urology team and it is felt that the urology team will be the primary admitting service and the hospitalist will do a consult note.   Rolan Bucco, MD 05/20/20 [id number]

## 2020-05-20 NOTE — H&P (Signed)
Urology H&P  CC: Scrotal abscess  History of Present Illness: Timothy Cantu is a 70 y.o. male with history of CHF, CAD/MI, DM (recent HgA1c 7) and atrial fibrillation on Coumadin who presented to the ED today with his son for scrotal pain and swelling x 2 days. No associated fevers/chills. Of note, the patient had a syncopal event while checking in the ED. He regained consciousness very quickly.   Since presentation, he has been afebrile and normotensive. He has intermittent tachycardia. Lab significant for leukocytosis 18.6, normal lactic acid of 1.3, and mild AKI to 1.41 from normal baseline. UA overall not remarkable for infection. Blood cultures x 2 pending. Scrotal US with 7.7cm hypoechoic, heterogeneous masslike collection within the soft tissues posterior to the scrotum c/w possible abscess. CT A/P w contrast showed a 5.6 x 6.9 cm rim enhancing fluid collection with air over the scrotum posterosuperior to the testicles and posterior to the penile Shaft c/w abscess due to gas-forming organism.   He has received ceftriaxone 2g and vancomycin as well as IVF hydration.  He reports have a scrotal abscess in the same area ~2 years ago that he popped. The abscess subsequently drained and resolved. He denies any other scrotal infections. Denies any other urologic history or surgeries. He denies any urinary or GI symptoms.   He last drank milk at 7am this morning.    Of note, the patient speaks [place]. His son helped with translating.   Past Medical History:  Diagnosis Date  . Atrial fibrillation (HCC)   . CAP (community acquired pneumonia)   . CHF (congestive heart failure) (HCC)   . Dilated cardiomyopathy (HCC)   . ETOH abuse   . Hypertension   . Long-term (current) use of anticoagulants   . MI (myocardial infarction) (HCC)   . Sciatica   . Syncope 02/2003   pt was out for 5-6 minutes    Past Surgical History:  Procedure Laterality Date  . CARDIAC CATHETERIZATION  02/28/2003   No  critical coronary artery disease -- Severe left ventricular dysfunction with probable secondary mitral regurgitation -- Atrial fibrillation with controlled ventricular response  . STOMACH SURGERY     Stomach Surgery? type:  in [place]    Current Hospital Medications:  Home Meds:  No current facility-administered medications on file prior to encounter.   Current Outpatient Medications on File Prior to Encounter  Medication Sig Dispense Refill  . acetaminophen (TYLENOL) 325 MG tablet Take 2 tablets (650 mg total) by mouth every 6 (six) hours as needed for mild pain (or Fever >/= 101).    . carvedilol (COREG) 25 MG tablet Take 1 tablet (25 mg total) by mouth 2 (two) times daily with a meal. 180 tablet 3  . hydrochlorothiazide (HYDRODIURIL) 12.5 MG tablet Take 1 tablet (12.5 mg total) by mouth daily. 90 tablet 3  . losartan (COZAAR) 50 MG tablet Take 1 tablet (50 mg total) by mouth daily. 90 tablet 3  . warfarin (COUMADIN) 5 MG tablet Take 1/2 a tablet to 1 tablet by mouth daily as directed by the coumadin clinic. 100 tablet 0     Scheduled Meds: . lidocaine (PF)  20 mL Other Once   Continuous Infusions: . sodium chloride 250 mL (05/20/20 1201)  . lactated ringers 150 mL/hr at 05/20/20 1218  . [START ON 05/21/2020] vancomycin     PRN Meds:.sodium chloride, fentaNYL (SUBLIMAZE) injection, fentaNYL (SUBLIMAZE) injection  Allergies: No Known Allergies  Family History  Problem Relation Age of Onset  .  Heart disease Mother        Pt unsure of what kind  . Heart attack Neg Hx     Social History:  reports that he quit smoking about 24 years ago. His smoking use included cigarettes. He smoked 20.00 packs per day. He has never used smokeless tobacco. He reports that he does not drink alcohol and does not use drugs.  ROS: Pertinent positives and negatives per HPI  Physical Exam:  Vital signs in last 24 hours: Temp:  [97.8 F (36.6 C)] 97.8 F (36.6 C) (04/03 0921) Pulse Rate:   [90-124] 99 (04/03 1500) Resp:  [12-36] 17 (04/03 1500) BP: (101-128)/(53-93) 126/71 (04/03 1500) SpO2:  [89 %-100 %] 99 % (04/03 1500) Weight:  [124.7 kg] 124.7 kg (04/03 0923) Constitutional:  Alert and oriented, laying in stretcher in no acute distress Cardiovascular: Regular rate Respiratory: Normal respiratory effort on RA GI: Abdomen is soft, nontender, nondistended GU: Normal, circumcised phallus with normal urethral meatus. Erythematous scrotum with swelling and induration along posterior scrotum without obvious area of fluctuance. No crepitus.  Neurologic: Grossly intact, no focal deficits Psychiatric: Normal mood and affect  Laboratory Data:  Recent Labs    05/20/20 0924  WBC 18.6*  HGB 15.8  HCT 47.1  PLT 160    Recent Labs    05/20/20 0924  NA 132*  K 3.8  CL 99  GLUCOSE 197*  BUN 20  CALCIUM 9.2  CREATININE 1.41*     Results for orders placed or performed during the hospital encounter of 05/20/20 (from the past 24 hour(s))  CBG monitoring, ED     Status: Abnormal   Collection Time: 05/20/20  9:19 AM  Result Value Ref Range   Glucose-Capillary 194 (H) 70 - 99 mg/dL  Comprehensive metabolic panel     Status: Abnormal   Collection Time: 05/20/20  9:24 AM  Result Value Ref Range   Sodium 132 (L) 135 - 145 mmol/L   Potassium 3.8 3.5 - 5.1 mmol/L   Chloride 99 98 - 111 mmol/L   CO2 23 22 - 32 mmol/L   Glucose, Bld 197 (H) 70 - 99 mg/dL   BUN 20 8 - 23 mg/dL   Creatinine, Ser 4.541.41 (H) 0.61 - 1.24 mg/dL   Calcium 9.2 8.9 - 09.810.3 mg/dL   Total Protein 7.4 6.5 - 8.1 g/dL   Albumin 4.2 3.5 - 5.0 g/dL   AST 20 15 - 41 U/L   ALT 23 0 - 44 U/L   Alkaline Phosphatase 47 38 - 126 U/L   Total Bilirubin 1.4 (H) 0.3 - 1.2 mg/dL   GFR, Estimated 54 (L) >60 mL/min   Anion gap 10 5 - 15  Troponin I (High Sensitivity)     Status: None   Collection Time: 05/20/20  9:24 AM  Result Value Ref Range   Troponin I (High Sensitivity) 10 <18 ng/L  CBC with Differential      Status: Abnormal   Collection Time: 05/20/20  9:24 AM  Result Value Ref Range   WBC 18.6 (H) 4.0 - 10.5 K/uL   RBC 5.31 4.22 - 5.81 MIL/uL   Hemoglobin 15.8 13.0 - 17.0 g/dL   HCT 11.947.1 14.739.0 - 82.952.0 %   MCV 88.7 80.0 - 100.0 fL   MCH 29.8 26.0 - 34.0 pg   MCHC 33.5 30.0 - 36.0 g/dL   RDW 56.215.3 13.011.5 - 86.515.5 %   Platelets 160 150 - 400 K/uL   nRBC 0.0 0.0 - 0.2 %  Neutrophils Relative % 77 %   Neutro Abs 14.4 (H) 1.7 - 7.7 K/uL   Lymphocytes Relative 12 %   Lymphs Abs 2.1 0.7 - 4.0 K/uL   Monocytes Relative 10 %   Monocytes Absolute 1.8 (H) 0.1 - 1.0 K/uL   Eosinophils Relative 0 %   Eosinophils Absolute 0.0 0.0 - 0.5 K/uL   Basophils Relative 0 %   Basophils Absolute 0.0 0.0 - 0.1 K/uL   Immature Granulocytes 1 %   Abs Immature Granulocytes 0.13 (H) 0.00 - 0.07 K/uL  Urinalysis, Routine w reflex microscopic Urine, Clean Catch     Status: Abnormal   Collection Time: 05/20/20 10:45 AM  Result Value Ref Range   Color, Urine YELLOW YELLOW   APPearance CLEAR CLEAR   Specific Gravity, Urine 1.020 1.005 - 1.030   pH 5.5 5.0 - 8.0   Glucose, UA NEGATIVE NEGATIVE mg/dL   Hgb urine dipstick TRACE (A) NEGATIVE   Bilirubin Urine NEGATIVE NEGATIVE   Ketones, ur NEGATIVE NEGATIVE mg/dL   Protein, ur NEGATIVE NEGATIVE mg/dL   Nitrite NEGATIVE NEGATIVE   Leukocytes,Ua NEGATIVE NEGATIVE  Urinalysis, Microscopic (reflex)     Status: Abnormal   Collection Time: 05/20/20 10:45 AM  Result Value Ref Range   RBC / HPF 0-5 0 - 5 RBC/hpf   WBC, UA 0-5 0 - 5 WBC/hpf   Bacteria, UA RARE (A) NONE SEEN   Squamous Epithelial / LPF 0-5 0 - 5  Troponin I (High Sensitivity)     Status: None   Collection Time: 05/20/20 11:27 AM  Result Value Ref Range   Troponin I (High Sensitivity) 10 <18 ng/L  Lactic acid, plasma     Status: None   Collection Time: 05/20/20 12:10 PM  Result Value Ref Range   Lactic Acid, Venous 1.3 0.5 - 1.9 mmol/L  Resp Panel by RT-PCR (Flu A&B, Covid) Nasopharyngeal Swab      Status: None   Collection Time: 05/20/20 12:10 PM   Specimen: Nasopharyngeal Swab; Nasopharyngeal(NP) swabs in vial transport medium  Result Value Ref Range   SARS Coronavirus 2 by RT PCR NEGATIVE NEGATIVE   Influenza A by PCR NEGATIVE NEGATIVE   Influenza B by PCR NEGATIVE NEGATIVE  Protime-INR     Status: Abnormal   Collection Time: 05/20/20  1:34 PM  Result Value Ref Range   Prothrombin Time 21.8 (H) 11.4 - 15.2 seconds   INR 2.0 (H) 0.8 - 1.2   Recent Results (from the past 240 hour(s))  Resp Panel by RT-PCR (Flu A&B, Covid) Nasopharyngeal Swab     Status: None   Collection Time: 05/20/20 12:10 PM   Specimen: Nasopharyngeal Swab; Nasopharyngeal(NP) swabs in vial transport medium  Result Value Ref Range Status   SARS Coronavirus 2 by RT PCR NEGATIVE NEGATIVE Final    Comment: (NOTE) SARS-CoV-2 target nucleic acids are NOT DETECTED.  The SARS-CoV-2 RNA is generally detectable in upper respiratory specimens during the acute phase of infection. The lowest concentration of SARS-CoV-2 viral copies this assay can detect is 138 copies/mL. A negative result does not preclude SARS-Cov-2 infection and should not be used as the sole basis for treatment or other patient management decisions. A negative result may occur with  improper specimen collection/handling, submission of specimen other than nasopharyngeal swab, presence of viral mutation(s) within the areas targeted by this assay, and inadequate number of viral copies(<138 copies/mL). A negative result must be combined with clinical observations, patient history, and epidemiological information. The expected result is  Negative.  Fact Sheet for Patients:  BloggerCourse.com  Fact Sheet for Healthcare Providers:  SeriousBroker.it  This test is no t yet approved or cleared by the [place] FDA and  has been authorized for detection and/or diagnosis of SARS-CoV-2 by FDA under an  Emergency Use Authorization (EUA). This EUA will remain  in effect (meaning this test can be used) for the duration of the COVID-19 declaration under Section 564(b)(1) of the Act, 21 U.S.C.section 360bbb-3(b)(1), unless the authorization is terminated  or revoked sooner.       Influenza A by PCR NEGATIVE NEGATIVE Final   Influenza B by PCR NEGATIVE NEGATIVE Final    Comment: (NOTE) The Xpert Xpress SARS-CoV-2/FLU/RSV plus assay is intended as an aid in the diagnosis of influenza from Nasopharyngeal swab specimens and should not be used as a sole basis for treatment. Nasal washings and aspirates are unacceptable for Xpert Xpress SARS-CoV-2/FLU/RSV testing.  Fact Sheet for Patients: BloggerCourse.com  Fact Sheet for Healthcare Providers: SeriousBroker.it  This test is not yet approved or cleared by the [place] FDA and has been authorized for detection and/or diagnosis of SARS-CoV-2 by FDA under an Emergency Use Authorization (EUA). This EUA will remain in effect (meaning this test can be used) for the duration of the COVID-19 declaration under Section 564(b)(1) of the Act, 21 U.S.C. section 360bbb-3(b)(1), unless the authorization is terminated or revoked.  Performed at [facility], [address], [place], [place] [address]     Renal Function: Recent Labs    05/17/20 6659 05/20/20 0924  CREATININE 1.25 1.41*   Estimated Creatinine Clearance: 68.4 mL/min (A) (by C-G formula based on SCr of 1.41 mg/dL (H)).  Radiologic Imaging: CT ABDOMEN PELVIS W CONTRAST  Result Date: 05/20/2020 CLINICAL DATA:  Scrotal mass with nausea and vomiting as well as scrotal pain and swelling. Syncopal episode. EXAM: CT ABDOMEN AND PELVIS WITH CONTRAST TECHNIQUE: Multidetector CT imaging of the abdomen and pelvis was performed using the standard protocol following bolus administration of intravenous contrast. CONTRAST:   OMNIPAQUE IOHEXOL 300 MG/ML  SOLN COMPARISON:  Scrotal ultrasound earlier today. FINDINGS: Lower chest: Lung bases are clear. Hepatobiliary: Single 7 mm gallstone present. Liver and biliary tree are normal. Pancreas: Normal. Spleen: Normal. Adrenals/Urinary Tract: Adrenal glands are normal. Kidneys are normal in size without hydronephrosis or nephrolithiasis. Minimal focal left renal cortical scarring over the posterior mid to lower pole. Ureters and bladder are normal. Stomach/Bowel: Stomach and small bowel are normal. Appendix is normal. Colon is normal. Vascular/Lymphatic: Calcified plaque over the abdominal aorta which is normal caliber. Retroaortic left renal vein. No adenopathy. Reproductive: Prostate gland is normal. There is a rim enhancing fluid collection with air over its nondependent portion. This is located within scrotum posterosuperior to the testicles and posterior to the penile shaft measuring 5.6 x 6.9 cm in transverse in AP dimension. This correlates with the recent abnormality seen on scrotal ultrasound and likely represents the scrotal abscess. Other: None. Musculoskeletal: No focal abnormality. Degenerative change of the spine. Significant disc disease at the L5-S1 level. IMPRESSION: 1. 5.6 x 6.9 cm rim enhancing fluid collection with air over the scrotum posterosuperior to the testicles and posterior to the penile shaft. This correlates with the recent abnormality seen on scrotal ultrasound and likely represents the scrotal abscess due to gas-forming organism. 2. Single 7 mm gallstone. 3. Minimal focal left renal cortical scarring. 4. Aortic atherosclerosis. Aortic Atherosclerosis (ICD10-I70.0). These results were called by telephone at the time  of interpretation on 05/20/2020 at 2:45 pm to provider MATTHEW TRIFAN , who verbally acknowledged these results. Electronically Signed   By: Elberta Fortis M.D.   On: 05/20/2020 14:39   US SCROTUM W/DOPPLER  Result Date: 05/20/2020 CLINICAL DATA:   Severe bilateral testicular pain and swelling 2 days with fever. EXAM: SCROTAL ULTRASOUND DOPPLER ULTRASOUND OF THE TESTICLES TECHNIQUE: Complete ultrasound examination of the testicles, epididymis, and other scrotal structures was performed. Color and spectral Doppler ultrasound were also utilized to evaluate blood flow to the testicles. COMPARISON:  None. FINDINGS: Right testicle Measurements: 5.2 x 2.4 x 3.3 cm. No mass or microlithiasis visualized. Left testicle Measurements: 4.6 x 2.5 x 3.4 cm. No mass or microlithiasis visualized. Right epididymis:  Normal in size and appearance. Left epididymis: 4 mm cyst versus spermatocele over the epididymal head. Hydrocele:  Small bilateral hydroceles. Varicocele:  None visualized. Pulsed Doppler interrogation of both testes demonstrates normal low resistance arterial and venous waveforms bilaterally. Over the midline posterior to the scrotum in the area patient's pain is a hypoechoic/heterogeneous masslike collection with through transmission measuring 5.4 x 7.5 x 7.7 cm. This may represent a solid mass versus complex fluid collection. IMPRESSION: 1. 7.7 cm hypoechoic, heterogeneous masslike collection within the soft tissues posterior to the scrotum correlating to the area patient's pain. This may represent a complex fluid collection/abscess and less likely solid mass. Recommend clinical correlation as CT pelvis with intravenous contrast would be helpful for further evaluation. 2. Normal testicular ultrasound without evidence of focal abnormality or torsion. Small bilateral hydroceles. 4 mm left epididymal cyst. Electronically Signed   By: Elberta Fortis M.D.   On: 05/20/2020 10:43    I independently reviewed the above imaging studies.  Impression/Recommendation Timothy Cantu is a 70 y.o. male with history of CHF, CAD/MI, DM (recent HgA1c 7) and atrial fibrillation on Coumadin who presented to the ED today for scrotal pain and swelling x 2 day with imaging  demonstrating a 6 x 7cm posterior scrotal abscess due to gas-forming organism. Currently afebrile and hemodynamically stable. No crepitus on exam or evidence of gas outside the abscess on imaging, so there is no current concern for Fournier's gangrene.  I recommended I&D of his scrotal abscess under anesthesia over a bedside procedure for the following reasons: - Patient on coumadin with higher risk of bleeding - Given gas in abscess, we would need to thoroughly probe the wound to ensure no tracking and completely evacuate the abscess as best as possible - No obvious area of fluctuance on exam to target  Risks and benefits of the procedure were discussed including risk of bleeding and possible injury to adjacent scrotal structures.  - Proceed with scrotal I&D under anesthesia  - Anticipate admission overnight to urology afterward for monitoring, wound care, and empiric antibiotics - Medicine consulted for syncopal workup. We greatly appreciate their help   Margette Fast 05/20/2020, 3:54 PM

## 2020-05-20 NOTE — Anesthesia Preprocedure Evaluation (Addendum)
Anesthesia Evaluation  Patient identified by MRN, date of birth, ID band Patient awake    Reviewed: Allergy & Precautions, NPO status , Patient's Chart, lab work & pertinent test results, reviewed documented beta blocker date and time   Airway Mallampati: II  TM Distance: >3 FB Neck ROM: Full    Dental  (+) Caps, Dental Advisory Given, Partial Lower, Partial Upper Upper and lower permanent bridges:   Pulmonary pneumonia, resolved, former smoker,    Pulmonary exam normal breath sounds clear to auscultation       Cardiovascular hypertension, Pt. on medications and Pt. on home beta blockers + Past MI and +CHF  Normal cardiovascular exam+ dysrhythmias Atrial Fibrillation  Rhythm:Regular Rate:Normal  Echo 01/07/18 Left ventricle: The cavity size was normal. Wall thickness was increased in a pattern of mild LVH. Systolic function was normal. The estimated ejection fraction was in the range of 60% to 65%. Wall motion was normal; there were no regional wall motion abnormalities.  - Mitral valve: There was mild regurgitation.  - Left atrium: The atrium was severely dilated.  - Right atrium: The atrium was moderately dilated.   EKG 05/20/20 Atrial fibrillation, ventricular response 99/min   Neuro/Psych PSYCHIATRIC DISORDERS  Neuromuscular disease    GI/Hepatic negative GI ROS, Neg liver ROS, (+)     substance abuse  alcohol use, Remote Hx/o ETOH abuse   Endo/Other  diabetes, Poorly Controlled, Type 2Hyperglycemia Obesity Recently diagnosed DM  Renal/GU Renal InsufficiencyRenal disease   Scrotal abscess    Musculoskeletal Sciatica   Abdominal (+) + obese,   Peds  Hematology Coumadin therapy- PT 21.8, INR 2.0   Anesthesia Other Findings   Reproductive/Obstetrics                            Anesthesia Physical Anesthesia Plan  ASA: III  Anesthesia Plan: General   Post-op Pain Management:     Induction: Intravenous  PONV Risk Score and Plan: 4 or greater and Treatment may vary due to age or medical condition and Ondansetron  Airway Management Planned: LMA  Additional Equipment:   Intra-op Plan:   Post-operative Plan: Extubation in OR  Informed Consent: I have reviewed the patients History and Physical, chart, labs and discussed the procedure including the risks, benefits and alternatives for the proposed anesthesia with the patient or authorized representative who has indicated his/her understanding and acceptance.     Dental advisory given  Plan Discussed with: CRNA and Anesthesiologist  Anesthesia Plan Comments: (Patient speaks [place].)       Anesthesia Quick Evaluation

## 2020-05-21 ENCOUNTER — Observation Stay (HOSPITAL_BASED_OUTPATIENT_CLINIC_OR_DEPARTMENT_OTHER): Payer: Medicare Other

## 2020-05-21 ENCOUNTER — Encounter (HOSPITAL_COMMUNITY): Payer: Self-pay | Admitting: Urology

## 2020-05-21 DIAGNOSIS — R55 Syncope and collapse: Secondary | ICD-10-CM | POA: Diagnosis not present

## 2020-05-21 LAB — BASIC METABOLIC PANEL
Anion gap: 8 (ref 5–15)
BUN: 19 mg/dL (ref 8–23)
CO2: 25 mmol/L (ref 22–32)
Calcium: 9 mg/dL (ref 8.9–10.3)
Chloride: 104 mmol/L (ref 98–111)
Creatinine, Ser: 1.2 mg/dL (ref 0.61–1.24)
GFR, Estimated: >60 mL/min (ref >60)
Glucose, Bld: 224 mg/dL — ABNORMAL HIGH (ref 70–99)
Potassium: 4.1 mmol/L (ref 3.5–5.1)
Sodium: 137 mmol/L (ref 135–145)

## 2020-05-21 LAB — GLUCOSE, RANDOM: Glucose, Bld: 278 mg/dL — ABNORMAL HIGH (ref 70–99)

## 2020-05-21 LAB — HIV ANTIBODY (ROUTINE TESTING W REFLEX): HIV Screen 4th Generation wRfx: NONREACTIVE

## 2020-05-21 LAB — ECHOCARDIOGRAM COMPLETE
Area-P 1/2: 4.71 cm2
Calc EF: 49.5 %
Height: 72 in
S' Lateral: 3.9 cm
Single Plane A2C EF: 43.4 %
Single Plane A4C EF: 53.7 %
Weight: 4228.8 oz

## 2020-05-21 LAB — GLUCOSE, CAPILLARY: Glucose-Capillary: 193 mg/dL — ABNORMAL HIGH (ref 70–99)

## 2020-05-21 LAB — PROTIME-INR
INR: 2.4 — ABNORMAL HIGH (ref 0.8–1.2)
Prothrombin Time: 25.5 seconds — ABNORMAL HIGH (ref 11.4–15.2)

## 2020-05-21 LAB — CBC
HCT: 46.3 % (ref 39.0–52.0)
Hemoglobin: 14.7 g/dL (ref 13.0–17.0)
MCH: 29.7 pg (ref 26.0–34.0)
MCHC: 31.7 g/dL (ref 30.0–36.0)
MCV: 93.5 fL (ref 80.0–100.0)
Platelets: 135 10*3/uL — ABNORMAL LOW (ref 150–400)
RBC: 4.95 MIL/uL (ref 4.22–5.81)
RDW: 15.4 % (ref 11.5–15.5)
WBC: 15.3 10*3/uL — ABNORMAL HIGH (ref 4.0–10.5)
nRBC: 0 % (ref 0.0–0.2)

## 2020-05-21 MED ORDER — LOSARTAN POTASSIUM 50 MG PO TABS
50.0000 mg | ORAL_TABLET | Freq: Every day | ORAL | Status: DC
  Administered 2020-05-21: 50 mg via ORAL
  Filled 2020-05-21: qty 1

## 2020-05-21 MED ORDER — HYDROCHLOROTHIAZIDE 12.5 MG PO CAPS
12.5000 mg | ORAL_CAPSULE | Freq: Every day | ORAL | Status: DC
  Administered 2020-05-21: 12.5 mg via ORAL
  Filled 2020-05-21: qty 1

## 2020-05-21 MED ORDER — PERFLUTREN LIPID MICROSPHERE
1.0000 mL | INTRAVENOUS | Status: AC | PRN
  Administered 2020-05-21: 2 mL via INTRAVENOUS
  Filled 2020-05-21: qty 10

## 2020-05-21 MED ORDER — DOXYCYCLINE HYCLATE 100 MG PO TABS: 100.0000 mg | ORAL_TABLET | Freq: Two times a day (BID) | ORAL | 0 refills | Status: DC

## 2020-05-21 NOTE — Plan of Care (Signed)
  Problem: Education: Goal: Knowledge of General Education information will improve Description: Including pain rating scale, medication(s)/side effects and non-pharmacologic comfort measures Outcome: Progressing   Problem: Health Behavior/Discharge Planning: Goal: Ability to manage health-related needs will improve Outcome: Progressing   Problem: Clinical Measurements: Goal: Ability to maintain clinical measurements within normal limits will improve Outcome: Progressing Goal: Will remain free from infection Outcome: Progressing Goal: Diagnostic test results will improve Outcome: Progressing Goal: Respiratory complications will improve Outcome: Progressing Goal: Cardiovascular complication will be avoided Outcome: Progressing   Problem: Pain Managment: Goal: General experience of comfort will improve Outcome: Progressing   Problem: Safety: Goal: Ability to remain free from injury will improve Outcome: Progressing   

## 2020-05-21 NOTE — Progress Notes (Addendum)
1 Day Post-Op Subjective: Overall doing well. Denies pain. Afebrile, normotensive, tachycardia resolved. Improving leukocytosis.   Objective: Vital signs in last 24 hours: Temp:  [97.5 F (36.4 C)-100 F (37.8 C)] 97.6 F (36.4 C) (04/04 0549) Pulse Rate:  [84-124] 99 (04/04 0750) Resp:  [12-36] 20 (04/04 0549) BP: (101-148)/(53-97) 139/88 (04/04 0750) SpO2:  [89 %-100 %] 95 % (04/04 0549) Weight:  [119.9 kg-124.7 kg] 119.9 kg (04/03 1842)  Intake/Output from previous day: 04/03 0701 - 04/04 0700 In: 1906.8 [P.O.:50; I.V.:1156.8; IV Piggyback:700] Out: 610 [Urine:600; Blood:10] Intake/Output this shift: No intake/output data recorded.  Physical Exam:  General: Alert and oriented, laying in bed in NAD CV: Regular rate Lungs: NWOB on RA Abdomen: Soft, nontender, nondistended GU: Normal uncircumcised phallus. Posterior scrotal wound healing well without purulent discharge. WTD dressing changed this morning by urology. Scrotal fluffs and jockstrap in place  Ext: Warm and well-perfused   Lab Results: Recent Labs    05/20/20 0924 05/21/20 0519  HGB 15.8 14.7  HCT 47.1 46.3   BMET Recent Labs    05/20/20 0924 05/20/20 2233 05/21/20 0519  NA 132*  --  137  K 3.8  --  4.1  CL 99  --  104  CO2 23  --  25  GLUCOSE 197* 278* 224*  BUN 20  --  19  CREATININE 1.41*  --  1.20  CALCIUM 9.2  --  9.0     Studies/Results: CT ABDOMEN PELVIS W CONTRAST  Result Date: 05/20/2020 CLINICAL DATA:  Scrotal mass with nausea and vomiting as well as scrotal pain and swelling. Syncopal episode. EXAM: CT ABDOMEN AND PELVIS WITH CONTRAST TECHNIQUE: Multidetector CT imaging of the abdomen and pelvis was performed using the standard protocol following bolus administration of intravenous contrast. CONTRAST:  OMNIPAQUE IOHEXOL 300 MG/ML  SOLN COMPARISON:  Scrotal ultrasound earlier today. FINDINGS: Lower chest: Lung bases are clear. Hepatobiliary: Single 7 mm gallstone present. Liver and  biliary tree are normal. Pancreas: Normal. Spleen: Normal. Adrenals/Urinary Tract: Adrenal glands are normal. Kidneys are normal in size without hydronephrosis or nephrolithiasis. Minimal focal left renal cortical scarring over the posterior mid to lower pole. Ureters and bladder are normal. Stomach/Bowel: Stomach and small bowel are normal. Appendix is normal. Colon is normal. Vascular/Lymphatic: Calcified plaque over the abdominal aorta which is normal caliber. Retroaortic left renal vein. No adenopathy. Reproductive: Prostate gland is normal. There is a rim enhancing fluid collection with air over its nondependent portion. This is located within scrotum posterosuperior to the testicles and posterior to the penile shaft measuring 5.6 x 6.9 cm in transverse in AP dimension. This correlates with the recent abnormality seen on scrotal ultrasound and likely represents the scrotal abscess. Other: None. Musculoskeletal: No focal abnormality. Degenerative change of the spine. Significant disc disease at the L5-S1 level. IMPRESSION: 1. 5.6 x 6.9 cm rim enhancing fluid collection with air over the scrotum posterosuperior to the testicles and posterior to the penile shaft. This correlates with the recent abnormality seen on scrotal ultrasound and likely represents the scrotal abscess due to gas-forming organism. 2. Single 7 mm gallstone. 3. Minimal focal left renal cortical scarring. 4. Aortic atherosclerosis. Aortic Atherosclerosis (ICD10-I70.0). These results were called by telephone at the time of interpretation on 05/20/2020 at 2:45 pm to provider MATTHEW TRIFAN , who verbally acknowledged these results. Electronically Signed   By: Elberta Fortis M.D.   On: 05/20/2020 14:39   US SCROTUM W/DOPPLER  Result Date: 05/20/2020 CLINICAL DATA:  Severe  bilateral testicular pain and swelling 2 days with fever. EXAM: SCROTAL ULTRASOUND DOPPLER ULTRASOUND OF THE TESTICLES TECHNIQUE: Complete ultrasound examination of the testicles,  epididymis, and other scrotal structures was performed. Color and spectral Doppler ultrasound were also utilized to evaluate blood flow to the testicles. COMPARISON:  None. FINDINGS: Right testicle Measurements: 5.2 x 2.4 x 3.3 cm. No mass or microlithiasis visualized. Left testicle Measurements: 4.6 x 2.5 x 3.4 cm. No mass or microlithiasis visualized. Right epididymis:  Normal in size and appearance. Left epididymis: 4 mm cyst versus spermatocele over the epididymal head. Hydrocele:  Small bilateral hydroceles. Varicocele:  None visualized. Pulsed Doppler interrogation of both testes demonstrates normal low resistance arterial and venous waveforms bilaterally. Over the midline posterior to the scrotum in the area patient's pain is a hypoechoic/heterogeneous masslike collection with through transmission measuring 5.4 x 7.5 x 7.7 cm. This may represent a solid mass versus complex fluid collection. IMPRESSION: 1. 7.7 cm hypoechoic, heterogeneous masslike collection within the soft tissues posterior to the scrotum correlating to the area patient's pain. This may represent a complex fluid collection/abscess and less likely solid mass. Recommend clinical correlation as CT pelvis with intravenous contrast would be helpful for further evaluation. 2. Normal testicular ultrasound without evidence of focal abnormality or torsion. Small bilateral hydroceles. 4 mm left epididymal cyst. Electronically Signed   By: Elberta Fortis M.D.   On: 05/20/2020 10:43    Assessment/Plan: Timothy Cantu is a 70 y.o. male with history of CHF, CAD/MI, DM (recent HgA1c 7) and atrial fibrillation on Coumadin who presented to the ED on 4/3 for scrotal pain and swelling x 2 day with imaging demonstrating a 6 x 7cm posterior scrotal abscess due to gas-forming organism. He is now s/p I&D under general anesthesia on 4/4.   He is overall clinically improving: has remained AFHDS with downtrending leukocytosis and improved pain. Pending echo for  syncopal workup per medicine.   - Diabetic diet. Stop IVF. Bowel regimen - WTD daily scrotal dressing changes. Patient and family will need to be comfortable with dressing changes prior to discharge - Continue empiric vanc and unasyn while in the hospital. Follow up wound cultures. Plan for doxy + augmentin on discharge per pharmacy recommendations   - Medicine following for syncopal workup (had possible episode in ED). We greatly appreciate their assistance - IS, SCDs, encourage ambulation - Continue home warfarin. Pharmacy managing dosage - Continue home meds - ISS - PRN analgesics, anti-emetics - Possible discharge today versus tomorrow pending patient/family learning wound care       LOS: 0 days   Margette Fast 05/21/2020, 8:07 AM

## 2020-05-21 NOTE — Progress Notes (Signed)
New orders for 24 units novolog held until glucose lab verification. Glucose lab verification 278, insulin not given and MD notified.

## 2020-05-21 NOTE — Progress Notes (Signed)
Patient discharged home with wife, discharge instruction given and explained to patient/son/wife and they verbalized understanding, patient denies any pain/distress. Demonstrated incision packing and care to patient's wife/son and they verbalized understanding, no sign of infection noted. Patient accompanied home by wife/son

## 2020-05-21 NOTE — Progress Notes (Signed)
Son helped complete admission information

## 2020-05-21 NOTE — Progress Notes (Signed)
ANTICOAGULATION CONSULT NOTE - Initial Consult  Pharmacy Consult for Warfarin Indication: atrial fibrillation  No Known Allergies  Patient Measurements: Height: 6' (182.9 cm) (Stated by patient, using the interpreter) Weight: 119.9 kg (264 lb 4.8 oz) IBW/kg (Calculated) : 77.6  Vital Signs: Temp: 97.6 F (36.4 C) (04/04 0549) Temp Source: Oral (04/04 0549) BP: 139/88 (04/04 0750) Pulse Rate: 99 (04/04 0750)  Labs: Recent Labs    05/20/20 0924 05/20/20 1127 05/20/20 1334 05/21/20 0519  HGB 15.8  --   --  14.7  HCT 47.1  --   --  46.3  PLT 160  --   --  135*  LABPROT  --   --  21.8* 25.5*  INR  --   --  2.0* 2.4*  CREATININE 1.41*  --   --  1.20  TROPONINIHS 10 10  --   --     Estimated Creatinine Clearance: 76.6 mL/min (by C-G formula based on SCr of 1.2 mg/dL).   Medical History: Past Medical History:  Diagnosis Date  . Atrial fibrillation (HCC)   . CAP (community acquired pneumonia)   . CHF (congestive heart failure) (HCC)   . Dilated cardiomyopathy (HCC)   . ETOH abuse   . Hypertension   . Long-term (current) use of anticoagulants   . MI (myocardial infarction) (HCC)   . Sciatica   . Syncope 02/2003   pt was out for 5-6 minutes   Assessment: 70 y/o M admitted with a scrotal abscess s/p I&D. Patient is on warfarin chronically for atrial fibrillation. INR is therapeutic on admission.   Home dose (per [facility] clinic notes 04/27/20): Warfarin 2.5 mg Mon, 5 mg all other days Last dose PTA: 4/3  Significant Events: -4/3: I&D of scrotal abscess  Today, 05/21/20  INR = 2.4 remains therapeutic  CBC: Hgb WNL, Plt slightly low  No documented bleeding issues  Diet: Carb modified; meal intake not charted  DDI: Pt on antibiotics (vancomycin, ampicillin/sulbactam), which have some potential to enhance effects of warfarin.  No significant DDI warranting empiric dose adjustment at this time.  Goal of Therapy:  INR 2-3  Plan:   Continue home dose of warfarin  2.5 mg PO this evening  Check INR daily while inpatient, CBC with AM labs tomorrow  Monitor for signs of bleeding  At this time, would recommend continuing patients home dose at discharge with follow up at anticoagulation clinic within 48 hours of discharge to monitor INR on antibiotics.  Cindi Carbon, PharmD 05/21/2020,9:21 AM

## 2020-05-21 NOTE — Discharge Instructions (Signed)
It is important to change the dressing/packing inside the infection cavity once a day.  It is okay to shower  Our office will call you to set up a follow-up later this week  If you do not think you are getting better/getting worse, please call to move your appointment up  Continue all antibiotics

## 2020-05-21 NOTE — Discharge Summary (Signed)
Date of admission: 05/20/2020  Date of discharge: 05/21/2020  Admission diagnosis: Scrotal abscess  Discharge diagnosis: Scrotal abscess  Secondary diagnoses:  - Diabetes - A Fib on coumadin   History and Physical: For full details, please see admission history and physical. Briefly, Timothy Cantu is a 70 y.o. male with history of CHF, CAD/MI, DM (recent HgA1c 7) and atrial fibrillation on Coumadin who presented to the ED on 4/3 for scrotal pain and swelling x 2 day with imaging demonstrating a 6 x 7cm posterior scrotal abscess due to gas-forming organism.   Hospital Course:  He underwent I&D of posterior scrotal abscess under general anesthesia on 4/4. He tolerated the procedure well and was transferred to the floor after receiving routine post-operative care. His diet was gradually advanced, and his pain was controlled with analgesics. He was continued on broad spectrum antibiotics.   His wound appeared to be healing well on POD1. He and his family were instructed how to the daily dressing changes (packing of wound with iodoform packing), and they felt comfortable with dressing changes prior to discharge.  Medicine was consulted given syncopal episode in ED. The patient had an unremarkable Echo, negative troponin x 2, and normal orthostatics as part of workup.   By POD1, the patient was AFHDS with downtrending leukocytosis, tolerating a regular diet without nausea/emesis, having pain controlled with oral analgesics, ambulating, and feeling comfortable with wound care. He was deemed appropriate for discharge home.  Prescribed doxycycline 100mg  BID x 7 days. We will arrange wound check in clinic later this week.   Laboratory values: Recent Labs    05/20/20 0924 05/21/20 0519  HGB 15.8 14.7  HCT 47.1 46.3   Recent Labs    05/20/20 0924 05/21/20 0519  CREATININE 1.41* 1.20    Disposition: Home  Discharge instruction: The patient was instructed to be ambulatory.  Discharge  medications:  Allergies as of 05/21/2020   No Known Allergies     Medication List    TAKE these medications   acetaminophen 325 MG tablet Commonly known as: TYLENOL Take 2 tablets (650 mg total) by mouth every 6 (six) hours as needed for mild pain (or Fever >/= 101). What changed: how much to take   carvedilol 25 MG tablet Commonly known as: COREG Take 1 tablet (25 mg total) by mouth 2 (two) times daily with a meal.   clotrimazole-betamethasone lotion Commonly known as: LOTRISONE Apply 1 application topically 2 (two) times daily as needed (dry skin/feet).   doxycycline 100 MG tablet Commonly known as: VIBRA-TABS Take 1 tablet (100 mg total) by mouth 2 (two) times daily.   hydrochlorothiazide 25 MG tablet Commonly known as: HYDRODIURIL Take 12.5 mg by mouth at bedtime.   hydrochlorothiazide 12.5 MG tablet Commonly known as: HYDRODIURIL Take 1 tablet (12.5 mg total) by mouth daily.   losartan 100 MG tablet Commonly known as: COZAAR Take 50 mg by mouth at bedtime.   losartan 50 MG tablet Commonly known as: COZAAR Take 1 tablet (50 mg total) by mouth daily.   metFORMIN 500 MG tablet Commonly known as: GLUCOPHAGE Take 500 mg by mouth daily after supper.   polyethylene glycol 17 g packet Commonly known as: MIRALAX / GLYCOLAX Take 17 g by mouth daily as needed (constipation).   warfarin 5 MG tablet Commonly known as: COUMADIN Take as directed. If you are unsure how to take this medication, talk to your nurse or doctor. Original instructions: Take 1/2 a tablet to 1 tablet by mouth daily  as directed by the coumadin clinic. What changed:   how much to take  how to take this  when to take this  additional instructions       Followup:   Follow-up Information    [facility] Follow up.   Why: We will call you to set up an appointment to be seen later on this week Contact information: [address] [place]  [address] [phone]

## 2020-05-21 NOTE — Progress Notes (Signed)
Echocardiogram 2D Echocardiogram with defintiy has been performed.  Timothy Cantu 05/21/2020, 8:48 AM

## 2020-05-24 LAB — AEROBIC/ANAEROBIC CULTURE W GRAM STAIN (SURGICAL/DEEP WOUND): Gram Stain: NONE SEEN

## 2020-05-25 LAB — CULTURE, BLOOD (ROUTINE X 2)
Culture: NO GROWTH
Special Requests: ADEQUATE

## 2020-05-28 LAB — BLOOD CULTURE ID PANEL (REFLEXED) - BCID2

## 2020-05-28 LAB — CULTURE, BLOOD (ROUTINE X 2): Special Requests: ADEQUATE

## 2020-06-01 ENCOUNTER — Other Ambulatory Visit: Payer: Self-pay

## 2020-06-01 ENCOUNTER — Ambulatory Visit (INDEPENDENT_AMBULATORY_CARE_PROVIDER_SITE_OTHER): Payer: Medicare Other

## 2020-06-01 DIAGNOSIS — I4891 Unspecified atrial fibrillation: Secondary | ICD-10-CM | POA: Diagnosis not present

## 2020-06-01 DIAGNOSIS — Z5181 Encounter for therapeutic drug level monitoring: Secondary | ICD-10-CM | POA: Diagnosis not present

## 2020-06-01 LAB — POCT INR: INR: 2.4 (ref 2.0–3.0)

## 2020-06-01 MED ORDER — WARFARIN SODIUM 5 MG PO TABS: ORAL_TABLET | ORAL | 0 refills | Status: DC

## 2020-06-01 NOTE — Patient Instructions (Signed)
Description   Continue on same dosage 1 tablet every day except 1/2 tablet only on Mondays. Recheck INR in 6 weeks. Call us with any medication changes or concerns # [phone] [facility].     

## 2020-09-08 ENCOUNTER — Other Ambulatory Visit: Payer: Self-pay | Admitting: Cardiovascular Disease

## 2020-09-10 NOTE — Telephone Encounter (Signed)
Pt is out of the country in [place] until 12/01/20.  Pt was given a 6 month supply of Warfarin (180 tablets) at his last Coumadin Clinic visit on 06/01/20 prior to leaving the country.  Pt should not need refill on Warfarin at this time.  Will deny Warfarin refill to pharmacy.

## 2020-09-10 NOTE — Telephone Encounter (Signed)
ATTEMPTED TO REACH TO SCHEDULE A FOLLOW UP APPOINTMENT WITH DR Eden Emms.

## 2020-12-02 ENCOUNTER — Encounter (HOSPITAL_COMMUNITY)
Admission: EM | Disposition: A | Discharge status: Home / Self Care | Payer: Self-pay | Source: Home / Self Care | Attending: Internal Medicine

## 2020-12-02 ENCOUNTER — Inpatient Hospital Stay (HOSPITAL_BASED_OUTPATIENT_CLINIC_OR_DEPARTMENT_OTHER)
Admission: EM | Admit: 2020-12-02 | Discharge: 2020-12-06 | DRG: 854 | Disposition: A | Discharge status: Home / Self Care | Payer: Medicare Other | Attending: Internal Medicine | Admitting: Internal Medicine

## 2020-12-02 ENCOUNTER — Emergency Department (HOSPITAL_BASED_OUTPATIENT_CLINIC_OR_DEPARTMENT_OTHER): Payer: Medicare Other

## 2020-12-02 ENCOUNTER — Encounter (HOSPITAL_BASED_OUTPATIENT_CLINIC_OR_DEPARTMENT_OTHER): Payer: Self-pay | Admitting: *Deleted

## 2020-12-02 ENCOUNTER — Other Ambulatory Visit: Payer: Self-pay

## 2020-12-02 DIAGNOSIS — I482 Chronic atrial fibrillation, unspecified: Secondary | ICD-10-CM | POA: Diagnosis present

## 2020-12-02 DIAGNOSIS — Z7901 Long term (current) use of anticoagulants: Secondary | ICD-10-CM

## 2020-12-02 DIAGNOSIS — R791 Abnormal coagulation profile: Secondary | ICD-10-CM | POA: Diagnosis not present

## 2020-12-02 DIAGNOSIS — Z8249 Family history of ischemic heart disease and other diseases of the circulatory system: Secondary | ICD-10-CM

## 2020-12-02 DIAGNOSIS — E669 Obesity, unspecified: Secondary | ICD-10-CM | POA: Diagnosis present

## 2020-12-02 DIAGNOSIS — N433 Hydrocele, unspecified: Secondary | ICD-10-CM | POA: Diagnosis present

## 2020-12-02 DIAGNOSIS — I5032 Chronic diastolic (congestive) heart failure: Secondary | ICD-10-CM | POA: Diagnosis present

## 2020-12-02 DIAGNOSIS — Z6834 Body mass index (BMI) 34.0-34.9, adult: Secondary | ICD-10-CM | POA: Diagnosis not present

## 2020-12-02 DIAGNOSIS — I252 Old myocardial infarction: Secondary | ICD-10-CM | POA: Diagnosis not present

## 2020-12-02 DIAGNOSIS — Z87891 Personal history of nicotine dependence: Secondary | ICD-10-CM | POA: Diagnosis not present

## 2020-12-02 DIAGNOSIS — N179 Acute kidney failure, unspecified: Secondary | ICD-10-CM | POA: Diagnosis present

## 2020-12-02 DIAGNOSIS — A4151 Sepsis due to Escherichia coli [E. coli]: Secondary | ICD-10-CM | POA: Diagnosis present

## 2020-12-02 DIAGNOSIS — I4891 Unspecified atrial fibrillation: Secondary | ICD-10-CM | POA: Diagnosis present

## 2020-12-02 DIAGNOSIS — Z79899 Other long term (current) drug therapy: Secondary | ICD-10-CM

## 2020-12-02 DIAGNOSIS — I2510 Atherosclerotic heart disease of native coronary artery without angina pectoris: Secondary | ICD-10-CM | POA: Diagnosis present

## 2020-12-02 DIAGNOSIS — Z20822 Contact with and (suspected) exposure to covid-19: Secondary | ICD-10-CM | POA: Diagnosis present

## 2020-12-02 DIAGNOSIS — I110 Hypertensive heart disease with heart failure: Secondary | ICD-10-CM | POA: Diagnosis present

## 2020-12-02 DIAGNOSIS — N492 Inflammatory disorders of scrotum: Secondary | ICD-10-CM | POA: Diagnosis present

## 2020-12-02 DIAGNOSIS — Z7984 Long term (current) use of oral hypoglycemic drugs: Secondary | ICD-10-CM

## 2020-12-02 DIAGNOSIS — N5082 Scrotal pain: Secondary | ICD-10-CM | POA: Diagnosis present

## 2020-12-02 DIAGNOSIS — I420 Dilated cardiomyopathy: Secondary | ICD-10-CM | POA: Diagnosis present

## 2020-12-02 DIAGNOSIS — E1165 Type 2 diabetes mellitus with hyperglycemia: Secondary | ICD-10-CM | POA: Diagnosis not present

## 2020-12-02 DIAGNOSIS — I10 Essential (primary) hypertension: Secondary | ICD-10-CM | POA: Diagnosis not present

## 2020-12-02 HISTORY — DX: Type 2 diabetes mellitus with hyperglycemia: E11.65

## 2020-12-02 HISTORY — PX: INCISION AND DRAINAGE ABSCESS: SHX5864

## 2020-12-02 LAB — CBC WITH DIFFERENTIAL/PLATELET
Abs Immature Granulocytes: 0.11 10*3/uL — ABNORMAL HIGH (ref 0.00–0.07)
Basophils Absolute: 0.1 10*3/uL (ref 0.0–0.1)
Basophils Relative: 0 %
Eosinophils Absolute: 0.1 10*3/uL (ref 0.0–0.5)
Eosinophils Relative: 0 %
HCT: 47.3 % (ref 39.0–52.0)
Hemoglobin: 15.6 g/dL (ref 13.0–17.0)
Immature Granulocytes: 1 %
Lymphocytes Relative: 15 %
Lymphs Abs: 2.5 10*3/uL (ref 0.7–4.0)
MCH: 30.3 pg (ref 26.0–34.0)
MCHC: 33 g/dL (ref 30.0–36.0)
MCV: 91.8 fL (ref 80.0–100.0)
Monocytes Absolute: 1.6 10*3/uL — ABNORMAL HIGH (ref 0.1–1.0)
Monocytes Relative: 10 %
Neutro Abs: 12 10*3/uL — ABNORMAL HIGH (ref 1.7–7.7)
Neutrophils Relative %: 74 %
Platelets: 190 10*3/uL (ref 150–400)
RBC: 5.15 MIL/uL (ref 4.22–5.81)
RDW: 14 % (ref 11.5–15.5)
WBC: 16.3 10*3/uL — ABNORMAL HIGH (ref 4.0–10.5)
nRBC: 0 % (ref 0.0–0.2)

## 2020-12-02 LAB — URINALYSIS, ROUTINE W REFLEX MICROSCOPIC
Bilirubin Urine: NEGATIVE
Glucose, UA: NEGATIVE mg/dL
Ketones, ur: NEGATIVE mg/dL
Leukocytes,Ua: NEGATIVE
Nitrite: NEGATIVE
Protein, ur: NEGATIVE mg/dL
Specific Gravity, Urine: 1.01 (ref 1.005–1.030)
pH: 5 (ref 5.0–8.0)

## 2020-12-02 LAB — GLUCOSE, CAPILLARY: Glucose-Capillary: 132 mg/dL — ABNORMAL HIGH (ref 70–99)

## 2020-12-02 LAB — URINALYSIS, MICROSCOPIC (REFLEX)

## 2020-12-02 LAB — RESP PANEL BY RT-PCR (FLU A&B, COVID) ARPGX2
Influenza A by PCR: NEGATIVE
Influenza B by PCR: NEGATIVE
SARS Coronavirus 2 by RT PCR: NEGATIVE

## 2020-12-02 LAB — COMPREHENSIVE METABOLIC PANEL
ALT: 35 U/L (ref 0–44)
AST: 29 U/L (ref 15–41)
Albumin: 4.2 g/dL (ref 3.5–5.0)
Alkaline Phosphatase: 72 U/L (ref 38–126)
Anion gap: 10 (ref 5–15)
BUN: 29 mg/dL — ABNORMAL HIGH (ref 8–23)
CO2: 22 mmol/L (ref 22–32)
Calcium: 8.9 mg/dL (ref 8.9–10.3)
Chloride: 103 mmol/L (ref 98–111)
Creatinine, Ser: 1.43 mg/dL — ABNORMAL HIGH (ref 0.61–1.24)
GFR, Estimated: 53 mL/min — ABNORMAL LOW (ref >60)
Glucose, Bld: 163 mg/dL — ABNORMAL HIGH (ref 70–99)
Potassium: 3.8 mmol/L (ref 3.5–5.1)
Sodium: 135 mmol/L (ref 135–145)
Total Bilirubin: 0.9 mg/dL (ref 0.3–1.2)
Total Protein: 7.5 g/dL (ref 6.5–8.1)

## 2020-12-02 LAB — PROTIME-INR
INR: 3 — ABNORMAL HIGH (ref 0.8–1.2)
Prothrombin Time: 30.8 seconds — ABNORMAL HIGH (ref 11.4–15.2)

## 2020-12-02 LAB — LACTIC ACID, PLASMA: Lactic Acid, Venous: 1.6 mmol/L (ref 0.5–1.9)

## 2020-12-02 SURGERY — INCISION AND DRAINAGE, ABSCESS
Anesthesia: General

## 2020-12-02 MED ORDER — METRONIDAZOLE 500 MG/100ML IV SOLN
500.0000 mg | Freq: Two times a day (BID) | INTRAVENOUS | Status: DC
  Administered 2020-12-03 – 2020-12-04 (×3): 500 mg via INTRAVENOUS
  Filled 2020-12-02 (×3): qty 100

## 2020-12-02 MED ORDER — VANCOMYCIN HCL IN DEXTROSE 1-5 GM/200ML-% IV SOLN
1000.0000 mg | Freq: Once | INTRAVENOUS | Status: DC
  Filled 2020-12-02: qty 200

## 2020-12-02 MED ORDER — LIDOCAINE HCL (PF) 2 % IJ SOLN
INTRAMUSCULAR | Status: AC
  Filled 2020-12-02: qty 5

## 2020-12-02 MED ORDER — MORPHINE SULFATE (PF) 2 MG/ML IV SOLN
2.0000 mg | INTRAVENOUS | Status: DC | PRN
  Administered 2020-12-03: 2 mg via INTRAVENOUS
  Filled 2020-12-02: qty 1

## 2020-12-02 MED ORDER — VANCOMYCIN HCL IN DEXTROSE 1-5 GM/200ML-% IV SOLN
1000.0000 mg | Freq: Once | INTRAVENOUS | Status: AC
  Administered 2020-12-02: 1000 mg via INTRAVENOUS
  Filled 2020-12-02: qty 200

## 2020-12-02 MED ORDER — LACTATED RINGERS IV BOLUS
1000.0000 mL | Freq: Once | INTRAVENOUS | Status: AC
  Administered 2020-12-02: 1000 mL via INTRAVENOUS

## 2020-12-02 MED ORDER — LACTATED RINGERS IV SOLN: INTRAVENOUS | Status: AC

## 2020-12-02 MED ORDER — FENTANYL CITRATE (PF) 100 MCG/2ML IJ SOLN
INTRAMUSCULAR | Status: AC
  Filled 2020-12-02: qty 2

## 2020-12-02 MED ORDER — INSULIN ASPART 100 UNIT/ML IJ SOLN
0.0000 [IU] | INTRAMUSCULAR | Status: DC
  Administered 2020-12-02: 1 [IU] via SUBCUTANEOUS
  Administered 2020-12-03: 3 [IU] via SUBCUTANEOUS
  Administered 2020-12-03: 2 [IU] via SUBCUTANEOUS

## 2020-12-02 MED ORDER — SODIUM CHLORIDE 0.9 % IV SOLN
2.0000 g | Freq: Once | INTRAVENOUS | Status: AC
  Administered 2020-12-02: 2 g via INTRAVENOUS
  Filled 2020-12-02: qty 2

## 2020-12-02 MED ORDER — IOHEXOL 300 MG/ML  SOLN
100.0000 mL | Freq: Once | INTRAMUSCULAR | Status: AC | PRN
  Administered 2020-12-02: 100 mL via INTRAVENOUS

## 2020-12-02 MED ORDER — ROCURONIUM BROMIDE 10 MG/ML (PF) SYRINGE
PREFILLED_SYRINGE | INTRAVENOUS | Status: AC
  Filled 2020-12-02: qty 10

## 2020-12-02 MED ORDER — SODIUM CHLORIDE 0.9 % IV SOLN
2.0000 g | Freq: Three times a day (TID) | INTRAVENOUS | Status: DC
  Administered 2020-12-03 – 2020-12-06 (×10): 2 g via INTRAVENOUS
  Filled 2020-12-02 (×11): qty 2

## 2020-12-02 MED ORDER — MUPIROCIN 2 % EX OINT
1.0000 "application " | TOPICAL_OINTMENT | Freq: Two times a day (BID) | CUTANEOUS | Status: DC
  Administered 2020-12-02 – 2020-12-06 (×8): 1 via NASAL
  Filled 2020-12-02 (×2): qty 22

## 2020-12-02 MED ORDER — VANCOMYCIN HCL IN DEXTROSE 1-5 GM/200ML-% IV SOLN
1000.0000 mg | Freq: Once | INTRAVENOUS | Status: AC
  Administered 2020-12-02: 1000 mg via INTRAVENOUS

## 2020-12-02 MED ORDER — SUCCINYLCHOLINE CHLORIDE 200 MG/10ML IV SOSY
PREFILLED_SYRINGE | INTRAVENOUS | Status: AC
  Filled 2020-12-02: qty 10

## 2020-12-02 MED ORDER — VANCOMYCIN HCL 1500 MG/300ML IV SOLN
1500.0000 mg | INTRAVENOUS | Status: DC
  Filled 2020-12-02: qty 300

## 2020-12-02 MED ORDER — PROPOFOL 10 MG/ML IV BOLUS
INTRAVENOUS | Status: AC
  Filled 2020-12-02: qty 20

## 2020-12-02 MED ORDER — METRONIDAZOLE 500 MG/100ML IV SOLN
500.0000 mg | Freq: Once | INTRAVENOUS | Status: AC
  Administered 2020-12-02: 500 mg via INTRAVENOUS
  Filled 2020-12-02: qty 100

## 2020-12-02 SURGICAL SUPPLY — 38 items
ADH SKN CLS APL DERMABOND .7 (GAUZE/BANDAGES/DRESSINGS)
APL SKNCLS STERI-STRIP NONHPOA (GAUZE/BANDAGES/DRESSINGS)
BAG COUNTER SPONGE SURGICOUNT (BAG) IMPLANT
BAG SPNG CNTER NS LX DISP (BAG)
BENZOIN TINCTURE PRP APPL 2/3 (GAUZE/BANDAGES/DRESSINGS) ×1 IMPLANT
BLADE HEX COATED 2.75 (ELECTRODE) ×2 IMPLANT
BLADE SURG 15 STRL LF DISP TIS (BLADE) ×1 IMPLANT
BLADE SURG 15 STRL SS (BLADE) ×2
BNDG GAUZE ELAST 4 BULKY (GAUZE/BANDAGES/DRESSINGS) ×2 IMPLANT
DERMABOND ADVANCED (GAUZE/BANDAGES/DRESSINGS)
DERMABOND ADVANCED .7 DNX12 (GAUZE/BANDAGES/DRESSINGS) ×1 IMPLANT
DRAIN PENROSE 0.25X18 (DRAIN) ×1 IMPLANT
DRAIN PENROSE 0.5X18 (DRAIN) ×1 IMPLANT
DRAPE LAPAROTOMY T 98X78 PEDS (DRAPES) ×2 IMPLANT
DRSG PAD ABDOMINAL 8X10 ST (GAUZE/BANDAGES/DRESSINGS) ×1 IMPLANT
ELECT REM PT RETURN 15FT ADLT (MISCELLANEOUS) ×2 IMPLANT
GAUZE PACKING IODOFORM 1/4X15 (PACKING) ×1 IMPLANT
GAUZE SPONGE 4X4 12PLY STRL (GAUZE/BANDAGES/DRESSINGS) ×2 IMPLANT
GLOVE SURG ENC TEXT LTX SZ7.5 (GLOVE) ×2 IMPLANT
GOWN STRL REUS W/TWL LRG LVL3 (GOWN DISPOSABLE) ×4 IMPLANT
KIT BASIN OR (CUSTOM PROCEDURE TRAY) ×2 IMPLANT
KIT TURNOVER KIT A (KITS) ×2 IMPLANT
NEEDLE HYPO 22GX1.5 SAFETY (NEEDLE) IMPLANT
NS IRRIG 1000ML POUR BTL (IV SOLUTION) ×1 IMPLANT
PACK BASIC VI WITH GOWN DISP (CUSTOM PROCEDURE TRAY) ×2 IMPLANT
PENCIL SMOKE EVACUATOR (MISCELLANEOUS) ×1 IMPLANT
SPONGE T-LAP 4X18 ~~LOC~~+RFID (SPONGE) ×2 IMPLANT
SUPPORT SCROTAL LG STRP (MISCELLANEOUS) ×1 IMPLANT
SUT ETHILON 3 0 PS 1 (SUTURE) ×1 IMPLANT
SUT MNCRL AB 4-0 PS2 18 (SUTURE) ×1 IMPLANT
SUT SILK 0 (SUTURE)
SUT SILK 0 30XBRD TIE 6 (SUTURE) ×1 IMPLANT
SUT VIC AB 3-0 SH 27 (SUTURE) ×2
SUT VIC AB 3-0 SH 27XBRD (SUTURE) ×1 IMPLANT
SWAB CULTURE ESWAB REG 1ML (MISCELLANEOUS) ×1 IMPLANT
SYR 20ML LL LF (SYRINGE) ×1 IMPLANT
SYR CONTROL 10ML LL (SYRINGE) IMPLANT
WATER STERILE IRR 1000ML POUR (IV SOLUTION) IMPLANT

## 2020-12-02 NOTE — Anesthesia Preprocedure Evaluation (Addendum)
Anesthesia Evaluation  Patient identified by MRN, date of birth, ID band Patient awake    Reviewed: Allergy & Precautions, NPO status , Patient's Chart, lab work & pertinent test results  History of Anesthesia Complications Negative for: history of anesthetic complications  Airway Mallampati: I  TM Distance: >3 FB Neck ROM: Full    Dental  (+) Caps, Dental Advisory Given   Pulmonary former smoker,  12/02/2020 SARS coronavirus NEG   breath sounds clear to auscultation       Cardiovascular hypertension, Pt. on medications (-) angina+ dysrhythmias Atrial Fibrillation  Rhythm:Irregular Rate:Tachycardia  05/2020 ECHO: EF 55-60%, normal LVF, severe ASH, normal RVF, mild MR   Neuro/Psych negative neurological ROS  negative psych ROS   GI/Hepatic negative GI ROS, (+)     substance abuse (remote h/o ETOH abuse)  ,   Endo/Other  diabetes (glu 132), Oral Hypoglycemic AgentsMorbid obesity  Renal/GU Renal InsufficiencyRenal disease     Musculoskeletal   Abdominal (+) + obese,   Peds  Hematology Coumadin: INR 3.0   Anesthesia Other Findings   Reproductive/Obstetrics                           Anesthesia Physical Anesthesia Plan  ASA: 3  Anesthesia Plan: General   Post-op Pain Management:    Induction: Intravenous  PONV Risk Score and Plan: 2 and Ondansetron, Dexamethasone and Treatment may vary due to age or medical condition  Airway Management Planned: LMA  Additional Equipment: None  Intra-op Plan:   Post-operative Plan:   Informed Consent: I have reviewed the patients History and Physical, chart, labs and discussed the procedure including the risks, benefits and alternatives for the proposed anesthesia with the patient or authorized representative who has indicated his/her understanding and acceptance.     Dental advisory given and Interpreter used for interveiw  Plan Discussed with:  CRNA and Surgeon  Anesthesia Plan Comments:       Anesthesia Quick Evaluation

## 2020-12-02 NOTE — H&P (Signed)
History and Physical    Timothy Cantu CNO:[id number] DOB: [birth year] DOA: 12/02/2020  PCP: [name], MD  Patient coming from: Home.  Patient's son provided translation for [place] language.  Chief Complaint: Scrotal pain and discharge.  HPI: Timothy Cantu is a 70 y.o. male with history of diabetes mellitus, A. fib, dilated cardiomyopathy with improved EF, hypertension presents to the ER with complaint of worsening pain and swelling over the scrotal area.  Patient has had prior surgery for scrotal abscess on April 2022.  Patient states he started noticing some small thickening of his skin around the scrotal area again in July of this year about 3 months ago which has gradually worsened and over the last few days increasing discharge was noticed with worsening pain and swelling.  ED Course: In the ER patient was febrile initially tachycardic was briefly in A. fib with RVR CT scan shows scrotal abscess for which urology was consulted.  Labs show WBC count was 16.3 lactic acid was normal creatinine 1.4 blood glucose of 163.  COVID test negative.  Patient was started on empiric antibiotics.  INR was 3.  Review of Systems: As per HPI, rest all negative.   Past Medical History:  Diagnosis Date   Atrial fibrillation (HCC)    CAP (community acquired pneumonia)    CHF (congestive heart failure) (HCC)    Controlled type 2 diabetes mellitus with hyperglycemia (HCC) 12/02/2020   Dilated cardiomyopathy (HCC)    ETOH abuse    Hypertension    Long-term (current) use of anticoagulants    MI (myocardial infarction) (HCC)    Sciatica    Syncope 02/2003   pt was out for 5-6 minutes    Past Surgical History:  Procedure Laterality Date   CARDIAC CATHETERIZATION  02/28/2003   No critical coronary artery disease -- Severe left ventricular dysfunction with probable secondary mitral regurgitation -- Atrial fibrillation with controlled ventricular response   INCISION AND DRAINAGE ABSCESS N/A  05/20/2020   Procedure: INCISION AND DRAINAGE ABSCESS;  Surgeon: Marcine Matar, MD;  Location: [facility];  Service: Urology;  Laterality: N/A;   STOMACH SURGERY     Stomach Surgery? type:  in [place]     reports that he quit smoking about 24 years ago. His smoking use included cigarettes. He smoked an average of 20 packs per day. He has never used smokeless tobacco. He reports that he does not drink alcohol and does not use drugs.  No Known Allergies  Family History  Problem Relation Age of Onset   Heart disease Mother        Pt unsure of what kind   Heart attack Neg Hx     Prior to Admission medications   Medication Sig Start Date End Date Taking? Authorizing Provider  carvedilol (COREG) 25 MG tablet Take 1 tablet (25 mg total) by mouth 2 (two) times daily with a meal. PLEASE CONTACT OFFICE FOR ADDITIONAL REFILLS. 1ST ATTEMPT Patient taking differently: Take 25 mg by mouth 2 (two) times daily with a meal. 09/10/20  Yes Wendall Stade, MD  hydrochlorothiazide (HYDRODIURIL) 12.5 MG tablet Take 1 tablet (12.5 mg total) by mouth daily. Patient taking differently: Take 12.5 mg by mouth in the morning. 05/10/20  Yes Wendall Stade, MD  losartan (COZAAR) 50 MG tablet Take 1 tablet (50 mg total) by mouth daily. Patient taking differently: Take 50 mg by mouth at bedtime. 05/10/20  Yes Wendall Stade, MD  metFORMIN (GLUCOPHAGE) 500 MG tablet Take 500  mg by mouth at bedtime. 04/26/20  Yes [provider]  warfarin (COUMADIN) 5 MG tablet Take 1/2 a tablet to 1 tablet by mouth daily as directed by the coumadin clinic. Patient taking differently: Take 2.5-5 mg by mouth See admin instructions. Take 5 mg by mouth in the morning on Sun/Tues/Wed/Thurs/Fri/Sat and 2.5 mg on Mondays 06/01/20  Yes Wendall Stade, MD  acetaminophen (TYLENOL) 325 MG tablet Take 2 tablets (650 mg total) by mouth every 6 (six) hours as needed for mild pain (or Fever >/= 101). Patient not taking: No sig reported 09/01/17    Meredeth Ide, MD  clotrimazole-betamethasone (LOTRISONE) lotion Apply 1 application topically 2 (two) times daily as needed (dry skin/feet). Patient not taking: No sig reported 05/01/20   [provider]    Physical Exam: Constitutional: Moderately built and nourished. Vitals:   12/02/20 1800 12/02/20 1847 12/02/20 1930 12/02/20 2137  BP: (!) 150/95 (!) 146/95 (!) 156/86 124/68  Pulse: (!) 113 (!) 112 (!) 111 (!) 106  Resp: (!) 25 (!) 30 (!) 28 20  Temp:      TempSrc:      SpO2: 99% 98% 96% 99%  Weight:    121.4 kg  Height:    6\' 2"  (1.88 m)   Eyes: Anicteric no pallor. ENMT: No discharge from the ears eyes nose and mouth. Neck: No mass felt.  No neck rigidity. Respiratory: No rhonchi or crepitations. Cardiovascular: S1-S2 heard. Abdomen: Soft nontender bowel sound present.  Scrotal swelling seen. Musculoskeletal: No edema. Skin: Scrotum is erythematous. Neurologic: Alert awake oriented to time place and person.  Moves all extremities. Psychiatric: Appears normal.  Normal affect.   Labs on Admission: I have personally reviewed following labs and imaging studies  CBC: Recent Labs  Lab 12/02/20 1622  WBC 16.3*  NEUTROABS 12.0*  HGB 15.6  HCT 47.3  MCV 91.8  PLT 190   Basic Metabolic Panel: Recent Labs  Lab 12/02/20 1622  NA 135  K 3.8  CL 103  CO2 22  GLUCOSE 163*  BUN 29*  CREATININE 1.43*  CALCIUM 8.9   GFR: Estimated Creatinine Clearance: 66.6 mL/min (A) (by C-G formula based on SCr of 1.43 mg/dL (H)). Liver Function Tests: Recent Labs  Lab 12/02/20 1622  AST 29  ALT 35  ALKPHOS 72  BILITOT 0.9  PROT 7.5  ALBUMIN 4.2   No results for input(s): LIPASE, AMYLASE in the last 168 hours. No results for input(s): AMMONIA in the last 168 hours. Coagulation Profile: Recent Labs  Lab 12/02/20 1622  INR 3.0*   Cardiac Enzymes: No results for input(s): CKTOTAL, CKMB, CKMBINDEX, TROPONINI in the last 168 hours. BNP (last 3 results) Recent  Labs    05/17/20 0911  PROBNP 465*   HbA1C: No results for input(s): HGBA1C in the last 72 hours. CBG: No results for input(s): GLUCAP in the last 168 hours. Lipid Profile: No results for input(s): CHOL, HDL, LDLCALC, TRIG, CHOLHDL, LDLDIRECT in the last 72 hours. Thyroid Function Tests: No results for input(s): TSH, T4TOTAL, FREET4, T3FREE, THYROIDAB in the last 72 hours. Anemia Panel: No results for input(s): VITAMINB12, FOLATE, FERRITIN, TIBC, IRON, RETICCTPCT in the last 72 hours. Urine analysis:    Component Value Date/Time   COLORURINE YELLOW 12/02/2020 1913   APPEARANCEUR CLEAR 12/02/2020 1913   LABSPEC 1.010 12/02/2020 1913   PHURINE 5.0 12/02/2020 1913   GLUCOSEU NEGATIVE 12/02/2020 1913   HGBUR SMALL (A) 12/02/2020 1913   BILIRUBINUR NEGATIVE 12/02/2020 1913  KETONESUR NEGATIVE 12/02/2020 1913   PROTEINUR NEGATIVE 12/02/2020 1913   NITRITE NEGATIVE 12/02/2020 1913   LEUKOCYTESUR NEGATIVE 12/02/2020 1913   Sepsis Labs: @LABRCNTIP (procalcitonin:4,lacticidven:4) ) Recent Results (from the past 240 hour(s))  Resp Panel by RT-PCR (Flu A&B, Covid) Nasopharyngeal Swab     Status: None   Collection Time: 12/02/20  7:17 PM   Specimen: Nasopharyngeal Swab; Nasopharyngeal(NP) swabs in vial transport medium  Result Value Ref Range Status   SARS Coronavirus 2 by RT PCR NEGATIVE NEGATIVE Final    Comment: (NOTE) SARS-CoV-2 target nucleic acids are NOT DETECTED.  The SARS-CoV-2 RNA is generally detectable in upper respiratory specimens during the acute phase of infection. The lowest concentration of SARS-CoV-2 viral copies this assay can detect is 138 copies/mL. A negative result does not preclude SARS-Cov-2 infection and should not be used as the sole basis for treatment or other patient management decisions. A negative result may occur with  improper specimen collection/handling, submission of specimen other than nasopharyngeal swab, presence of viral mutation(s) within  the areas targeted by this assay, and inadequate number of viral copies(<138 copies/mL). A negative result must be combined with clinical observations, patient history, and epidemiological information. The expected result is Negative.  Fact Sheet for Patients:  12/04/20  Fact Sheet for Healthcare Providers:  BloggerCourse.com  This test is no t yet approved or cleared by the [employer]iousBro[employer].it FDA and  has been authorized for detection and/or diagnosis of SARS-CoV-2 by FDA under an Emergency Use Authorization (EUA). This EUA will remain  in effect (meaning this test can be used) for the duration of the COVID-19 declaration under Section 564(b)(1) of the Act, 21 U.S.C.section 360bbb-3(b)(1), unless the authorization is terminated  or revoked sooner.       Influenza A by PCR NEGATIVE NEGATIVE Final   Influenza B by PCR NEGATIVE NEGATIVE Final    Comment: (NOTE) The Xpert Xpress SARS-CoV-2/FLU/RSV plus assay is intended as an aid in the diagnosis of influenza from Nasopharyngeal swab specimens and should not be used as a sole basis for treatment. Nasal washings and aspirates are unacceptable for Xpert Xpress SARS-CoV-2/FLU/RSV testing.  Fact Sheet for Patients: [place]  Fact Sheet for Healthcare Providers: BloggerCourse.com  This test is not yet approved or cleared by the SeriousBroker.it FDA and has been authorized for detection and/or diagnosis of SARS-CoV-2 by FDA under an Emergency Use Authorization (EUA). This EUA will remain in effect (meaning this test can be used) for the duration of the COVID-19 declaration under Section 564(b)(1) of the Act, 21 U.S.C. section 360bbb-3(b)(1), unless the authorization is terminated or revoked.  Performed at [facility], [address] [place], [place]      Radiological Exams on Admission: DG Chest 2  View  Result Date: 12/02/2020 CLINICAL DATA:  Suspected sepsis EXAM: CHEST - 2 VIEW COMPARISON:  08/31/2017 FINDINGS: Heart and mediastinal contours are within normal limits. No focal opacities or effusions. No acute bony abnormality. IMPRESSION: No active cardiopulmonary disease. Electronically Signed   By: 09/02/2017 M.D.   On: 12/02/2020 18:12   CT ABDOMEN PELVIS W CONTRAST  Result Date: 12/02/2020 CLINICAL DATA:  History of scrotal abscess, now with increased swelling and drainage. EXAM: CT ABDOMEN AND PELVIS WITH CONTRAST TECHNIQUE: Multidetector CT imaging of the abdomen and pelvis was performed using the standard protocol following bolus administration of intravenous contrast. CONTRAST:  12/04/2020 OMNIPAQUE IOHEXOL 300 MG/ML  SOLN COMPARISON:  CT May 20, 2020 FINDINGS: Lower chest: Bibasilar  atelectasis versus scarring. Hepatobiliary: No suspicious hepatic lesion. Cholelithiasis without findings of acute cholecystitis. No biliary ductal dilation. Pancreas: No pancreatic ductal dilation or evidence of acute inflammation. Spleen: Within normal limits. Adrenals/Urinary Tract: Adrenal glands are unremarkable. Kidneys are normal, without renal calculi, solid enhancing lesion, or hydronephrosis. Bladder is unremarkable for degree of distension. Stomach/Bowel: Small hiatal hernia otherwise the stomach is unremarkable for degree of distension. No pathologic dilation of small or large bowel. The appendix and terminal ileum appear normal. No evidence of acute bowel inflammation. Vascular/Lymphatic: Aortic and branch vessel atherosclerosis without aneurysmal dilation. No pathologically enlarged abdominal or pelvic lymph nodes. Reproductive: There is a rim enhancing gas and fluid collection in the scrotum posterosuperior to the testicle in posterior to the penile shaft common same location as the previously drained scrotal abscess now measuring 5.8 x 3.7 cm on image 111/8 previously 5.6 x 6.9 cm. There is a new  thick walled fluid collection posterior to this along the left lateral aspect of the scrotum which measures 4.4 x 1.4 cm on image 116/8, this appears to track to the cutaneous skin surface along the left posterior hemiscrotum on image 121/8. Bilateral hydroceles. Prostate glands unremarkable. Other: No abdominopelvic free fluid. Musculoskeletal: Multilevel degenerative changes spine. No acute osseous abnormality. IMPRESSION: 1. Scrotal abscess similar location but decreased in size in comparison to the previously drained scrotal abscess now measuring 5.8 x 3.7 cm. 2. New thick walled fluid collection posterior to this along the left lateral aspect of the scrotum which measures 4.4 x 1.4 cm, this appears to track to the cutaneous skin surface along the left posterior hemiscrotum. Findings also likely reflect a scrotal abscess. 3. Bilateral hydroceles. 4. Cholelithiasis without acute cholecystitis. 5. Aortic Atherosclerosis (ICD10-I70.0). Electronically Signed   By: Maudry Mayhew M.D.   On: 12/02/2020 18:27    EKG: Independently reviewed.  A. fib rate 112 bpm.  Assessment/Plan Principal Problem:   Scrotal abscess Active Problems:   Congestive dilated cardiomyopathy (HCC)   ATRIAL FIBRILLATION   Controlled type 2 diabetes mellitus with hyperglycemia (HCC)    Scrotal abscess with possible delving sepsis discussed with on-call Urologist Dr. Janice Norrie who is planning to take patient to surgery.  Holding Coumadin.  Empiric antibiotics follow cultures.  Antibiotics are discussed with pharmacy since patient has open wound empiric antibiotics were directed so. A. fib Coumadin is therapeutic but urologist did not recommend any reversal.  Will restart Coumadin once okay with neurologist.  Follow INR.  Patient is on beta-blockers for rate control. History of cardiomyopathy with improved EF.  Per cardiology note cardiomyopathy was related to alcoholism which improved after abstaining. Hypertension on ARB and  hydrochlorothiazide and beta-blockers. Diabetes mellitus type 2 presently on sliding scale coverage. Chronic and disease stage III creatinine appears to be at baseline.  Since patient has scrotal abscess and undergoing surgery will need close monitoring for any further worsening inpatient status.   DVT prophylaxis: SCDs.  Will start Coumadin once okay with surgery. Code Status: Full code. Family Communication: Patient's son. Disposition Plan: Home. Consults called: Urology. Admission status: Inpatient.   Eduard Clos MD [facility] Pager [phone].  If 7PM-7AM, please contact night-coverage [email or web address] Password [facility]  12/02/2020, 10:42 PM

## 2020-12-02 NOTE — ED Notes (Signed)
Interpreter service used.

## 2020-12-02 NOTE — Progress Notes (Signed)
Pharmacy Antibiotic Note  Timothy Cantu is a 70 y.o. male admitted on 12/02/2020 presenting with drainage and swelling from surgical site.  Pharmacy has been consulted for vancomycin and cefepime dosing.  Plan: Vancomycin 2000 mg IV x 1, then 1500 mg IV q 24h (eAUC 491, Goal AUC 400-550, SCr 1.43) Cefepime 2g IV every 8 hours Monitor renal function, Cx and clinical progression to narrow Vancomycin levels as needed   Height: 6\' 2"  (188 cm) Weight: 120.2 kg (265 lb) IBW/kg (Calculated) : 82.2  Temp (24hrs), Avg:99.2 F (37.3 C), Min:99.2 F (37.3 C), Max:99.2 F (37.3 C)  Recent Labs  Lab 12/02/20 1622 12/02/20 1623  WBC 16.3*  --   CREATININE 1.43*  --   LATICACIDVEN  --  1.6    Estimated Creatinine Clearance: 66.2 mL/min (A) (by C-G formula based on SCr of 1.43 mg/dL (H)).    No Known Allergies  12/04/20, PharmD Clinical Pharmacist ED Pharmacist Phone # [phone] 12/02/2020 6:23 PM

## 2020-12-02 NOTE — ED Notes (Signed)
ED Provider at bedside. 

## 2020-12-02 NOTE — Consult Note (Signed)
Urology Consult Note   Requesting Attending Physician:  Eduard Clos, MD Service Providing Consult: Urology  Consulting Attending: Dr. Sebastian Ache   Reason for Consult:  Recurrent scrotal abscess  HPI: Timothy Cantu is seen in consultation for reasons noted above at the request of Eduard Clos, MD for evaluation of recurrent scrotal abscess.  This is a 70 y.o. male with Hx of CHF, Afib (Coumadin), HTN, DM, CAD/MI who presents to the ED w/ scrotal swelling pain consistent with scrotal abscess. He had a prior posterior scrotal abscess in 05/2020, which was drained in the OR. He was discharged on antibiotics and w/ packing for wound care.   He states things healed; however, over the past few weeks he noticed a recurrent bump near his prior incision, which has steadily grown and become erythematous and painful.   He is AF but with tachycardia (100s-120s). He is otherwise HDS. He does have a leukocytosis of 16.3. Cr of 1.43 (baseline 1.2-1.4). Lactate normal 1.6. UA without evidence of infection. Covid negative.   CT w/ scrotal abscess similar location but decreased in size in comparison to the previously drained scrotal abscess now measuring 5.8 x 3.7 cm. New thick walled fluid collection posterior to this along the left lateral aspect of the scrotum which measures 4.4 x 1.4 cm, this appears to track to the cutaneous skin surface along the left posterior hemiscrotum. Findings also likely reflect a scrotal abscess.  Patient last ate around noon.   Past Medical History: Past Medical History:  Diagnosis Date   Atrial fibrillation (HCC)    CAP (community acquired pneumonia)    CHF (congestive heart failure) (HCC)    Dilated cardiomyopathy (HCC)    ETOH abuse    Hypertension    Long-term (current) use of anticoagulants    MI (myocardial infarction) (HCC)    Sciatica    Syncope 02/2003   pt was out for 5-6 minutes    Past Surgical History:  Past Surgical History:   Procedure Laterality Date   CARDIAC CATHETERIZATION  02/28/2003   No critical coronary artery disease -- Severe left ventricular dysfunction with probable secondary mitral regurgitation -- Atrial fibrillation with controlled ventricular response   INCISION AND DRAINAGE ABSCESS N/A 05/20/2020   Procedure: INCISION AND DRAINAGE ABSCESS;  Surgeon: Marcine Matar, MD;  Location: [facility];  Service: Urology;  Laterality: N/A;   STOMACH SURGERY     Stomach Surgery? type:  in [place]    Medication: Current Facility-Administered Medications  Medication Dose Route Frequency Provider Last Rate Last Admin   [START ON 12/03/2020] ceFEPIme (MAXIPIME) 2 g in sodium chloride 0.9 % 100 mL IVPB  2 g Intravenous Q8H Daylene Posey, RPH       [START ON 12/03/2020] vancomycin (VANCOREADY) IVPB 1500 mg/300 mL  1,500 mg Intravenous Q24H Daylene Posey, RPH        Allergies: No Known Allergies  Social History: Social History   Tobacco Use   Smoking status: Former    Packs/day: 20.00    Types: Cigarettes    Quit date: 03/04/1996    Years since quitting: 24.7   Smokeless tobacco: Never  Vaping Use   Vaping Use: Never used  Substance Use Topics   Alcohol use: No   Drug use: No    Family History Family History  Problem Relation Age of Onset   Heart disease Mother        Pt unsure of what kind   Heart attack Neg Hx  Review of Systems 10 systems were reviewed and are negative except as noted specifically in the HPI.  Objective   Vital signs in last 24 hours: BP 124/68 (BP Location: Right Arm)   Pulse (!) 106   Temp 99.2 F (37.3 C) (Oral)   Resp 20   Ht 6\' 2"  (1.88 m)   Wt 120.2 kg   SpO2 99%   BMI 34.02 kg/m   Physical Exam General: NAD, A&O, resting, appropriate HEENT: [place]/AT, EOMI, MMM Pulmonary: Normal work of breathing Cardiovascular: HDS, adequate peripheral perfusion Abdomen: Soft, NTTP, nondistended. GU: Swollen and edematous scrotum w/ erythema mainly posteriorly,  there is an area of fluctuance posteriorly w/ a pinpoint hole which is draining purulent fluid. No crepitus. No skin necrosis. Extremities: warm and well perfused Neuro: Appropriate, no focal neurological deficits  Most Recent Labs: Lab Results  Component Value Date   WBC 16.3 (H) 12/02/2020   HGB 15.6 12/02/2020   HCT 47.3 12/02/2020   PLT 190 12/02/2020    Lab Results  Component Value Date   NA 135 12/02/2020   K 3.8 12/02/2020   CL 103 12/02/2020   CO2 22 12/02/2020   BUN 29 (H) 12/02/2020   CREATININE 1.43 (H) 12/02/2020   CALCIUM 8.9 12/02/2020   PHOS 3.1 09/26/2016    Lab Results  Component Value Date   INR 3.0 (H) 12/02/2020    IMAGING: DG Chest 2 View  Result Date: 12/02/2020 CLINICAL DATA:  Suspected sepsis EXAM: CHEST - 2 VIEW COMPARISON:  08/31/2017 FINDINGS: Heart and mediastinal contours are within normal limits. No focal opacities or effusions. No acute bony abnormality. IMPRESSION: No active cardiopulmonary disease. Electronically Signed   By: 09/02/2017 M.D.   On: 12/02/2020 18:12   CT ABDOMEN PELVIS W CONTRAST  Result Date: 12/02/2020 CLINICAL DATA:  History of scrotal abscess, now with increased swelling and drainage. EXAM: CT ABDOMEN AND PELVIS WITH CONTRAST TECHNIQUE: Multidetector CT imaging of the abdomen and pelvis was performed using the standard protocol following bolus administration of intravenous contrast. CONTRAST:  12/04/2020 OMNIPAQUE IOHEXOL 300 MG/ML  SOLN COMPARISON:  CT May 20, 2020 FINDINGS: Lower chest: Bibasilar atelectasis versus scarring. Hepatobiliary: No suspicious hepatic lesion. Cholelithiasis without findings of acute cholecystitis. No biliary ductal dilation. Pancreas: No pancreatic ductal dilation or evidence of acute inflammation. Spleen: Within normal limits. Adrenals/Urinary Tract: Adrenal glands are unremarkable. Kidneys are normal, without renal calculi, solid enhancing lesion, or hydronephrosis. Bladder is unremarkable for  degree of distension. Stomach/Bowel: Small hiatal hernia otherwise the stomach is unremarkable for degree of distension. No pathologic dilation of small or large bowel. The appendix and terminal ileum appear normal. No evidence of acute bowel inflammation. Vascular/Lymphatic: Aortic and branch vessel atherosclerosis without aneurysmal dilation. No pathologically enlarged abdominal or pelvic lymph nodes. Reproductive: There is a rim enhancing gas and fluid collection in the scrotum posterosuperior to the testicle in posterior to the penile shaft common same location as the previously drained scrotal abscess now measuring 5.8 x 3.7 cm on image 111/8 previously 5.6 x 6.9 cm. There is a new thick walled fluid collection posterior to this along the left lateral aspect of the scrotum which measures 4.4 x 1.4 cm on image 116/8, this appears to track to the cutaneous skin surface along the left posterior hemiscrotum on image 121/8. Bilateral hydroceles. Prostate glands unremarkable. Other: No abdominopelvic free fluid. Musculoskeletal: Multilevel degenerative changes spine. No acute osseous abnormality. IMPRESSION: 1. Scrotal abscess similar location but decreased in size in comparison  to the previously drained scrotal abscess now measuring 5.8 x 3.7 cm. 2. New thick walled fluid collection posterior to this along the left lateral aspect of the scrotum which measures 4.4 x 1.4 cm, this appears to track to the cutaneous skin surface along the left posterior hemiscrotum. Findings also likely reflect a scrotal abscess. 3. Bilateral hydroceles. 4. Cholelithiasis without acute cholecystitis. 5. Aortic Atherosclerosis (ICD10-I70.0). Electronically Signed   By: Maudry Mayhew M.D.   On: 12/02/2020 18:27    ------  Assessment:  71 y.o. male with Hx of CHF, Afib (Coumadin), HTN, DM, CAD/MI who presents to the ED w/ scrotal swelling pain consistent with recurrent scrotal abscess.    Recommendations: - Plan for I&D under  anesthesia - Please keep patient NPO   Thank you for this consult. Please contact the urology consult pager with any further questions/concerns.

## 2020-12-02 NOTE — ED Notes (Signed)
Back from CT, LR infusing.

## 2020-12-02 NOTE — ED Notes (Signed)
Pt c/o feeling dizzy, "cold sweat", nausea after triage. Head of stretcher laid flat. HR 120s. Pt remained alert. States Sx improved with lying down. Dr. Durwin Nora made aware of pt status. Pt taken to room 9 at this time for eval

## 2020-12-02 NOTE — ED Notes (Signed)
Swollen scrotum, open wound with drainage.

## 2020-12-02 NOTE — ED Triage Notes (Signed)
Hx of scrotal abscess- was admitted earlier this year. States a "dot" appeared next to surgery scar in July and has been draining some clear fluid intermittently. Area became swollen this week, much worse this morning. Pt also has hx of afib- HR 108-127 in triage. Speaks [place], family interpreting for triage

## 2020-12-02 NOTE — ED Provider Notes (Signed)
[facility] EMERGENCY DEPARTMENT Provider Note   CSN: [id number] Arrival date & time: 12/02/20  1551     History Chief Complaint  Patient presents with   Wound Check    Timothy Cantu is a 70 y.o. male.  The history is provided by the patient and a relative. The history is limited by a language barrier. A language interpreter was used.  Wound Check Associated symptoms include shortness of breath. Pertinent negatives include no chest pain, no abdominal pain and no headaches. Patient presents for scrotal swelling.  He has a history of scrotal abscess which he underwent incision and drainage for earlier this year, in April.  Over the past couple months, he has noticed some drainage of serous fluid from the area.  Over the past week, area became swollen.  Swelling worsened this morning.  There is associated pain and tenderness.  In addition to the swelling, patient is also endorsing shortness of breath and lightheadedness.  He has not had known fevers or chills.  Medical history is notable for CHF and atrial fibrillation.  He takes Coreg twice daily.  He takes warfarin and his last dose was last night.  Other home medications include metformin and other antihypertensives.     Past Medical History:  Diagnosis Date   Atrial fibrillation (HCC)    CAP (community acquired pneumonia)    CHF (congestive heart failure) (HCC)    Controlled type 2 diabetes mellitus with hyperglycemia (HCC) 12/02/2020   Dilated cardiomyopathy (HCC)    ETOH abuse    Hypertension    Long-term (current) use of anticoagulants    MI (myocardial infarction) (HCC)    Sciatica    Syncope 02/2003   pt was out for 5-6 minutes    Patient Active Problem List   Diagnosis Date Noted   Controlled type 2 diabetes mellitus with hyperglycemia (HCC) 12/02/2020   Scrotal abscess 05/20/2020   CAP (community acquired pneumonia) 08/31/2017   Syncope 08/31/2017   HTN (hypertension) 04/12/2014   Encounter for  therapeutic drug monitoring 03/16/2013   Long term (current) use of anticoagulants 05/16/2010   CHF 11/30/2008   Congestive dilated cardiomyopathy (HCC) 04/27/2008   ATRIAL FIBRILLATION 04/27/2008   SCIATICA, ACUTE 04/27/2008    Past Surgical History:  Procedure Laterality Date   CARDIAC CATHETERIZATION  02/28/2003   No critical coronary artery disease -- Severe left ventricular dysfunction with probable secondary mitral regurgitation -- Atrial fibrillation with controlled ventricular response   INCISION AND DRAINAGE ABSCESS N/A 05/20/2020   Procedure: INCISION AND DRAINAGE ABSCESS;  Surgeon: Marcine Matar, MD;  Location: [facility];  Service: Urology;  Laterality: N/A;   INCISION AND DRAINAGE ABSCESS N/A 12/02/2020   Procedure: INCISION AND DRAINAGE SCROTAL  ABSCESS;  Surgeon: Sebastian Ache, MD;  Location: [facility];  Service: Urology;  Laterality: N/A;   STOMACH SURGERY     Stomach Surgery? type:  in [place]       Family History  Problem Relation Age of Onset   Heart disease Mother        Pt unsure of what kind   Heart attack Neg Hx     Social History   Tobacco Use   Smoking status: Former    Packs/day: 20.00    Types: Cigarettes    Quit date: 03/04/1996    Years since quitting: 24.7   Smokeless tobacco: Never  Vaping Use   Vaping Use: Never used  Substance Use Topics   Alcohol use: No   Drug use: No  Home Medications Prior to Admission medications   Medication Sig Start Date End Date Taking? Authorizing Provider  carvedilol (COREG) 25 MG tablet Take 1 tablet (25 mg total) by mouth 2 (two) times daily with a meal. PLEASE CONTACT OFFICE FOR ADDITIONAL REFILLS. 1ST ATTEMPT Patient taking differently: Take 25 mg by mouth 2 (two) times daily with a meal. 09/10/20  Yes Wendall Stade, MD  hydrochlorothiazide (HYDRODIURIL) 12.5 MG tablet Take 1 tablet (12.5 mg total) by mouth daily. Patient taking differently: Take 12.5 mg by mouth in the morning. 05/10/20  Yes Wendall Stade, MD  losartan (COZAAR) 50 MG tablet Take 1 tablet (50 mg total) by mouth daily. Patient taking differently: Take 50 mg by mouth at bedtime. 05/10/20  Yes Wendall Stade, MD  metFORMIN (GLUCOPHAGE) 500 MG tablet Take 500 mg by mouth at bedtime. 04/26/20  Yes [provider]  warfarin (COUMADIN) 5 MG tablet Take 1/2 a tablet to 1 tablet by mouth daily as directed by the coumadin clinic. Patient taking differently: Take 2.5-5 mg by mouth See admin instructions. Take 5 mg by mouth in the morning on Sun/Tues/Wed/Thurs/Fri/Sat and 2.5 mg on Mondays 06/01/20  Yes Wendall Stade, MD  acetaminophen (TYLENOL) 325 MG tablet Take 2 tablets (650 mg total) by mouth every 6 (six) hours as needed for mild pain (or Fever >/= 101). Patient not taking: No sig reported 09/01/17   Meredeth Ide, MD  clotrimazole-betamethasone (LOTRISONE) lotion Apply 1 application topically 2 (two) times daily as needed (dry skin/feet). Patient not taking: No sig reported 05/01/20   [provider]    Allergies    Patient has no known allergies.  Review of Systems   Review of Systems  Constitutional:  Positive for fatigue. Negative for chills and fever.  HENT:  Negative for congestion, ear pain, rhinorrhea and sore throat.   Eyes:  Negative for pain and visual disturbance.  Respiratory:  Positive for shortness of breath. Negative for cough.   Cardiovascular:  Negative for chest pain and palpitations.  Gastrointestinal:  Negative for abdominal pain, diarrhea, nausea and vomiting.  Genitourinary:  Positive for scrotal swelling. Negative for dysuria, flank pain and hematuria.  Musculoskeletal:  Negative for arthralgias, back pain, myalgias and neck pain.  Skin:  Positive for wound. Negative for color change and rash.  Neurological:  Positive for light-headedness. Negative for seizures, syncope and headaches.  Hematological:  Bruises/bleeds easily (On Coumadin).  All other systems reviewed and are  negative.  Physical Exam Updated Vital Signs BP 127/79 (BP Location: Right Arm)   Pulse 98   Temp (!) 97.5 F (36.4 C) (Oral)   Resp 16   Ht 6\' 2"  (1.88 m)   Wt 121.4 kg   SpO2 97%   BMI 34.36 kg/m   Physical Exam Vitals and nursing note reviewed.  Constitutional:      General: He is not in acute distress.    Appearance: Normal appearance. He is well-developed. He is not toxic-appearing or diaphoretic.  HENT:     Head: Normocephalic and atraumatic.     Right Ear: External ear normal.     Left Ear: External ear normal.     Nose: Nose normal.     Mouth/Throat:     Mouth: Mucous membranes are moist.     Pharynx: Oropharynx is clear.  Eyes:     General: No scleral icterus.    Extraocular Movements: Extraocular movements intact.     Conjunctiva/sclera: Conjunctivae normal.  Cardiovascular:  Rate and Rhythm: Tachycardia present. Rhythm irregular.     Heart sounds: No murmur heard. Pulmonary:     Effort: Pulmonary effort is normal. No respiratory distress.     Breath sounds: Normal breath sounds. No wheezing or rales.  Chest:     Chest wall: No tenderness.  Abdominal:     Palpations: Abdomen is soft.     Tenderness: There is no abdominal tenderness. There is no guarding.  Genitourinary:    Penis: Normal.      Comments: Diffuse scrotal swelling with erythema, tenderness, and induration to the inferior aspect of scrotum.  On the left side, there is an outpouching with presence of fluctuance.  No active draining at this time.  Skin findings do not extend beyond scrotal area. Musculoskeletal:     Cervical back: Normal range of motion and neck supple. No rigidity.     Right lower leg: No edema.     Left lower leg: No edema.  Skin:    General: Skin is warm and dry.     Coloration: Skin is not jaundiced or pale.  Neurological:     General: No focal deficit present.     Mental Status: He is alert and oriented to person, place, and time.     Cranial Nerves: No cranial nerve  deficit.     Sensory: No sensory deficit.     Motor: No weakness.  Psychiatric:        Mood and Affect: Mood normal.        Behavior: Behavior normal.        Thought Content: Thought content normal.        Judgment: Judgment normal.    ED Results / Procedures / Treatments   Labs (all labs ordered are listed, but only abnormal results are displayed) Labs Reviewed  SURGICAL PCR SCREEN - Abnormal; Notable for the following components:      Result Value   Staphylococcus aureus POSITIVE (*)    All other components within normal limits  COMPREHENSIVE METABOLIC PANEL - Abnormal; Notable for the following components:   Glucose, Bld 163 (*)    BUN 29 (*)    Creatinine, Ser 1.43 (*)    GFR, Estimated 53 (*)    All other components within normal limits  CBC WITH DIFFERENTIAL/PLATELET - Abnormal; Notable for the following components:   WBC 16.3 (*)    Neutro Abs 12.0 (*)    Monocytes Absolute 1.6 (*)    Abs Immature Granulocytes 0.11 (*)    All other components within normal limits  PROTIME-INR - Abnormal; Notable for the following components:   Prothrombin Time 30.8 (*)    INR 3.0 (*)    All other components within normal limits  URINALYSIS, ROUTINE W REFLEX MICROSCOPIC - Abnormal; Notable for the following components:   Hgb urine dipstick SMALL (*)    All other components within normal limits  URINALYSIS, MICROSCOPIC (REFLEX) - Abnormal; Notable for the following components:   Bacteria, UA RARE (*)    All other components within normal limits  CBC WITH DIFFERENTIAL/PLATELET - Abnormal; Notable for the following components:   WBC 14.0 (*)    Platelets 145 (*)    Neutro Abs 12.3 (*)    Abs Immature Granulocytes 0.20 (*)    All other components within normal limits  PROTIME-INR - Abnormal; Notable for the following components:   Prothrombin Time 30.8 (*)    INR 3.0 (*)    All other components within normal limits  COMPREHENSIVE METABOLIC PANEL - Abnormal; Notable for the  following components:   Glucose, Bld 193 (*)    Calcium 8.7 (*)    Total Bilirubin 1.3 (*)    All other components within normal limits  GLUCOSE, CAPILLARY - Abnormal; Notable for the following components:   Glucose-Capillary 132 (*)    All other components within normal limits  GLUCOSE, CAPILLARY - Abnormal; Notable for the following components:   Glucose-Capillary 143 (*)    All other components within normal limits  GLUCOSE, CAPILLARY - Abnormal; Notable for the following components:   Glucose-Capillary 184 (*)    All other components within normal limits  GLUCOSE, CAPILLARY - Abnormal; Notable for the following components:   Glucose-Capillary 203 (*)    All other components within normal limits  HEMOGLOBIN A1C - Abnormal; Notable for the following components:   Hgb A1c MFr Bld 6.2 (*)    All other components within normal limits  GLUCOSE, CAPILLARY - Abnormal; Notable for the following components:   Glucose-Capillary 254 (*)    All other components within normal limits  GLUCOSE, CAPILLARY - Abnormal; Notable for the following components:   Glucose-Capillary 326 (*)    All other components within normal limits  PROTIME-INR - Abnormal; Notable for the following components:   Prothrombin Time 36.5 (*)    INR 3.7 (*)    All other components within normal limits  CBC WITH DIFFERENTIAL/PLATELET - Abnormal; Notable for the following components:   WBC 19.1 (*)    Neutro Abs 16.2 (*)    Monocytes Absolute 1.1 (*)    Abs Immature Granulocytes 0.24 (*)    All other components within normal limits  BASIC METABOLIC PANEL - Abnormal; Notable for the following components:   Glucose, Bld 204 (*)    Calcium 8.5 (*)    All other components within normal limits  GLUCOSE, CAPILLARY - Abnormal; Notable for the following components:   Glucose-Capillary 303 (*)    All other components within normal limits  GLUCOSE, CAPILLARY - Abnormal; Notable for the following components:    Glucose-Capillary 204 (*)    All other components within normal limits  GLUCOSE, CAPILLARY - Abnormal; Notable for the following components:   Glucose-Capillary 179 (*)    All other components within normal limits  GLUCOSE, CAPILLARY - Abnormal; Notable for the following components:   Glucose-Capillary 174 (*)    All other components within normal limits  CULTURE, BLOOD (ROUTINE X 2)  CULTURE, BLOOD (ROUTINE X 2)  RESP PANEL BY RT-PCR (FLU A&B, COVID) ARPGX2  AEROBIC/ANAEROBIC CULTURE W GRAM STAIN (SURGICAL/DEEP WOUND)  LACTIC ACID, PLASMA  LACTIC ACID, PLASMA  TYPE AND SCREEN  ABO/RH    EKG EKG Interpretation  Date/Time:  Sunday December 02 2020 16:01:45 EDT Ventricular Rate:  112 PR Interval:    QRS Duration: 88 QT Interval:  322 QTC Calculation: 439 R Axis:   14 Text Interpretation: Atrial fibrillation with rapid ventricular response Anterior infarct , age undetermined Abnormal ECG Confirmed by Gloris Manchester [phone]) on 12/02/2020 4:04:03 PM  Radiology DG Chest 2 View  Result Date: 12/02/2020 CLINICAL DATA:  Suspected sepsis EXAM: CHEST - 2 VIEW COMPARISON:  08/31/2017 FINDINGS: Heart and mediastinal contours are within normal limits. No focal opacities or effusions. No acute bony abnormality. IMPRESSION: No active cardiopulmonary disease. Electronically Signed   By: Charlett Nose M.D.   On: 12/02/2020 18:12   CT ABDOMEN PELVIS W CONTRAST  Result Date: 12/02/2020 CLINICAL DATA:  History of scrotal abscess,  now with increased swelling and drainage. EXAM: CT ABDOMEN AND PELVIS WITH CONTRAST TECHNIQUE: Multidetector CT imaging of the abdomen and pelvis was performed using the standard protocol following bolus administration of intravenous contrast. CONTRAST:  OMNIPAQUE IOHEXOL 300 MG/ML  SOLN COMPARISON:  CT May 20, 2020 FINDINGS: Lower chest: Bibasilar atelectasis versus scarring. Hepatobiliary: No suspicious hepatic lesion. Cholelithiasis without findings of acute  cholecystitis. No biliary ductal dilation. Pancreas: No pancreatic ductal dilation or evidence of acute inflammation. Spleen: Within normal limits. Adrenals/Urinary Tract: Adrenal glands are unremarkable. Kidneys are normal, without renal calculi, solid enhancing lesion, or hydronephrosis. Bladder is unremarkable for degree of distension. Stomach/Bowel: Small hiatal hernia otherwise the stomach is unremarkable for degree of distension. No pathologic dilation of small or large bowel. The appendix and terminal ileum appear normal. No evidence of acute bowel inflammation. Vascular/Lymphatic: Aortic and branch vessel atherosclerosis without aneurysmal dilation. No pathologically enlarged abdominal or pelvic lymph nodes. Reproductive: There is a rim enhancing gas and fluid collection in the scrotum posterosuperior to the testicle in posterior to the penile shaft common same location as the previously drained scrotal abscess now measuring 5.8 x 3.7 cm on image 111/8 previously 5.6 x 6.9 cm. There is a new thick walled fluid collection posterior to this along the left lateral aspect of the scrotum which measures 4.4 x 1.4 cm on image 116/8, this appears to track to the cutaneous skin surface along the left posterior hemiscrotum on image 121/8. Bilateral hydroceles. Prostate glands unremarkable. Other: No abdominopelvic free fluid. Musculoskeletal: Multilevel degenerative changes spine. No acute osseous abnormality. IMPRESSION: 1. Scrotal abscess similar location but decreased in size in comparison to the previously drained scrotal abscess now measuring 5.8 x 3.7 cm. 2. New thick walled fluid collection posterior to this along the left lateral aspect of the scrotum which measures 4.4 x 1.4 cm, this appears to track to the cutaneous skin surface along the left posterior hemiscrotum. Findings also likely reflect a scrotal abscess. 3. Bilateral hydroceles. 4. Cholelithiasis without acute cholecystitis. 5. Aortic  Atherosclerosis (ICD10-I70.0). Electronically Signed   By: Maudry Mayhew M.D.   On: 12/02/2020 18:27    Procedures Procedures   Medications Ordered in ED Medications  ceFEPIme (MAXIPIME) 2 g in sodium chloride 0.9 % 100 mL IVPB (2 g Intravenous New Bag/Given 12/04/20 0636)  mupirocin ointment (BACTROBAN) 2 % 1 application (1 application Nasal Given 12/04/20 1124)  lactated ringers infusion ( Intravenous Restarted 12/03/20 0624)  morphine 2 MG/ML injection 2 mg (2 mg Intravenous Given 12/03/20 0916)  oxyCODONE (Oxy IR/ROXICODONE) 5 MG immediate release tablet (  Not Given 12/03/20 0156)  Chlorhexidine Gluconate Cloth 2 % PADS 6 each (6 each Topical Given 12/03/20 1231)  MEDLINE mouth rinse (15 mLs Mouth Rinse Given 12/04/20 1124)  carvedilol (COREG) tablet 25 mg (25 mg Oral Given 12/04/20 0818)  insulin aspart (novoLOG) injection 0-9 Units (2 Units Subcutaneous Given 12/04/20 1223)  vancomycin (VANCOCIN) IVPB 1000 mg/200 mL premix (1,000 mg Intravenous New Bag/Given 12/04/20 0826)  oxyCODONE (Oxy IR/ROXICODONE) immediate release tablet 5 mg (has no administration in time range)  senna-docusate (Senokot-S) tablet 1 tablet (1 tablet Oral Given 12/04/20 1124)  insulin glargine-yfgn (SEMGLEE) injection 10 Units (10 Units Subcutaneous Given 12/03/20 2223)  lactated ringers bolus 1,000 mL (0 mLs Intravenous Stopped 12/02/20 1848)  iohexol (OMNIPAQUE) 300 MG/ML solution 100 mL (100 mLs Intravenous Contrast Given 12/02/20 1729)  ceFEPIme (MAXIPIME) 2 g in sodium chloride 0.9 % 100 mL IVPB (0 g Intravenous Stopped 12/02/20  1848)  metroNIDAZOLE (FLAGYL) IVPB 500 mg (0 mg Intravenous Stopped 12/02/20 1908)  vancomycin (VANCOCIN) IVPB 1000 mg/200 mL premix (0 mg Intravenous Stopped 12/02/20 2026)    Followed by  vancomycin (VANCOCIN) IVPB 1000 mg/200 mL premix (1,000 mg Intravenous New Bag/Given 12/02/20 2028)  oxyCODONE (Oxy IR/ROXICODONE) immediate release tablet 5 mg (5 mg Oral Given 12/03/20 0128)     Or  oxyCODONE (ROXICODONE) 5 MG/5ML solution 5 mg ( Oral See Alternative 12/03/20 0128)    ED Course  I have reviewed the triage vital signs and the nursing notes.  Pertinent labs & imaging results that were available during my care of the patient were reviewed by me and considered in my medical decision making (see chart for details).    MDM Rules/Calculators/A&P                          Patient presents for increased swelling in the area of previous incision and drainage on his scrotum.  On arrival, he is tachycardic and tachypneic.  EKG shows atrial fibrillation with RVR.  Per chart review, patient is on Coreg, 25 mg twice daily.  On exam, he is alert and oriented.  He is accompanied by his son at bedside.  On examination of his scrotal area, he does have diffuse swelling.  On the inferior aspect of his scrotum, he has erythema, induration, and tenderness.  He reports that the swelling has significantly worsened over the past 24 hours.  Given concern for recurrence of abscess, CT scan was ordered.  Bolus of IV fluids given and broad-spectrum antibiotics ordered.  Plan is for gentle hydration given his history of CHF.  CT scan showed recurrence of scrotal abscess.  I did discuss the patient with urologist on-call.  He requested admission to [facility].  He also requested that patient be made n.p.o. for now.  He will assess the patient on his arrival at [place] long for further planning of source control.  Patient and son were updated on plan.  They were agreeable.  Patient denied any new or worsening symptoms.  Patient was transferred in stable condition.  Final Clinical Impression(s) / ED Diagnoses Final diagnoses:  Scrotal abscess    Rx / DC Orders ED Discharge Orders     None        Gloris Manchester, MD 12/04/20 1355

## 2020-12-02 NOTE — ED Notes (Signed)
Report called to [facility] 1401

## 2020-12-02 NOTE — ED Notes (Signed)
Report given to [facility]. 

## 2020-12-03 ENCOUNTER — Encounter (HOSPITAL_COMMUNITY): Payer: Self-pay | Admitting: Urology

## 2020-12-03 ENCOUNTER — Inpatient Hospital Stay (HOSPITAL_COMMUNITY): Payer: Medicare Other | Admitting: Anesthesiology

## 2020-12-03 DIAGNOSIS — I10 Essential (primary) hypertension: Secondary | ICD-10-CM

## 2020-12-03 DIAGNOSIS — I420 Dilated cardiomyopathy: Secondary | ICD-10-CM

## 2020-12-03 DIAGNOSIS — I4891 Unspecified atrial fibrillation: Secondary | ICD-10-CM

## 2020-12-03 DIAGNOSIS — N492 Inflammatory disorders of scrotum: Secondary | ICD-10-CM

## 2020-12-03 DIAGNOSIS — E1165 Type 2 diabetes mellitus with hyperglycemia: Secondary | ICD-10-CM

## 2020-12-03 LAB — COMPREHENSIVE METABOLIC PANEL
ALT: 33 U/L (ref 0–44)
AST: 25 U/L (ref 15–41)
Albumin: 4.1 g/dL (ref 3.5–5.0)
Alkaline Phosphatase: 60 U/L (ref 38–126)
Anion gap: 7 (ref 5–15)
BUN: 19 mg/dL (ref 8–23)
CO2: 23 mmol/L (ref 22–32)
Calcium: 8.7 mg/dL — ABNORMAL LOW (ref 8.9–10.3)
Chloride: 105 mmol/L (ref 98–111)
Creatinine, Ser: 1.07 mg/dL (ref 0.61–1.24)
GFR, Estimated: >60 mL/min (ref >60)
Glucose, Bld: 193 mg/dL — ABNORMAL HIGH (ref 70–99)
Potassium: 4 mmol/L (ref 3.5–5.1)
Sodium: 135 mmol/L (ref 135–145)
Total Bilirubin: 1.3 mg/dL — ABNORMAL HIGH (ref 0.3–1.2)
Total Protein: 7.1 g/dL (ref 6.5–8.1)

## 2020-12-03 LAB — ABO/RH: ABO/RH(D): O POS

## 2020-12-03 LAB — CBC WITH DIFFERENTIAL/PLATELET
Abs Immature Granulocytes: 0.2 10*3/uL — ABNORMAL HIGH (ref 0.00–0.07)
Basophils Absolute: 0 10*3/uL (ref 0.0–0.1)
Basophils Relative: 0 %
Eosinophils Absolute: 0 10*3/uL (ref 0.0–0.5)
Eosinophils Relative: 0 %
HCT: 48.1 % (ref 39.0–52.0)
Hemoglobin: 15.5 g/dL (ref 13.0–17.0)
Immature Granulocytes: 1 %
Lymphocytes Relative: 7 %
Lymphs Abs: 1 10*3/uL (ref 0.7–4.0)
MCH: 30.1 pg (ref 26.0–34.0)
MCHC: 32.2 g/dL (ref 30.0–36.0)
MCV: 93.4 fL (ref 80.0–100.0)
Monocytes Absolute: 0.4 10*3/uL (ref 0.1–1.0)
Monocytes Relative: 3 %
Neutro Abs: 12.3 10*3/uL — ABNORMAL HIGH (ref 1.7–7.7)
Neutrophils Relative %: 89 %
Platelets: 145 10*3/uL — ABNORMAL LOW (ref 150–400)
RBC: 5.15 MIL/uL (ref 4.22–5.81)
RDW: 14 % (ref 11.5–15.5)
WBC: 14 10*3/uL — ABNORMAL HIGH (ref 4.0–10.5)
nRBC: 0 % (ref 0.0–0.2)

## 2020-12-03 LAB — TYPE AND SCREEN
ABO/RH(D): O POS
Antibody Screen: NEGATIVE

## 2020-12-03 LAB — GLUCOSE, CAPILLARY
Glucose-Capillary: 143 mg/dL — ABNORMAL HIGH (ref 70–99)
Glucose-Capillary: 184 mg/dL — ABNORMAL HIGH (ref 70–99)
Glucose-Capillary: 203 mg/dL — ABNORMAL HIGH (ref 70–99)
Glucose-Capillary: 254 mg/dL — ABNORMAL HIGH (ref 70–99)
Glucose-Capillary: 326 mg/dL — ABNORMAL HIGH (ref 70–99)
Glucose-Capillary: 303 mg/dL — ABNORMAL HIGH (ref 70–99)

## 2020-12-03 LAB — SURGICAL PCR SCREEN
MRSA, PCR: NEGATIVE
Staphylococcus aureus: POSITIVE — AB

## 2020-12-03 LAB — LACTIC ACID, PLASMA: Lactic Acid, Venous: 1.8 mmol/L (ref 0.5–1.9)

## 2020-12-03 LAB — HEMOGLOBIN A1C
Hgb A1c MFr Bld: 6.2 % — ABNORMAL HIGH (ref 4.8–5.6)
Mean Plasma Glucose: 131.24 mg/dL

## 2020-12-03 LAB — PROTIME-INR
INR: 3 — ABNORMAL HIGH (ref 0.8–1.2)
Prothrombin Time: 30.8 seconds — ABNORMAL HIGH (ref 11.4–15.2)

## 2020-12-03 MED ORDER — MEPERIDINE HCL 50 MG/ML IJ SOLN: 6.2500 mg | INTRAMUSCULAR | Status: DC | PRN

## 2020-12-03 MED ORDER — INSULIN GLARGINE-YFGN 100 UNIT/ML ~~LOC~~ SOLN
10.0000 [IU] | Freq: Every day | SUBCUTANEOUS | Status: DC
  Administered 2020-12-03 – 2020-12-05 (×3): 10 [IU] via SUBCUTANEOUS
  Filled 2020-12-03 (×4): qty 0.1

## 2020-12-03 MED ORDER — CHLORHEXIDINE GLUCONATE CLOTH 2 % EX PADS
6.0000 | MEDICATED_PAD | Freq: Every day | CUTANEOUS | Status: DC
  Administered 2020-12-03 – 2020-12-05 (×3): 6 via TOPICAL

## 2020-12-03 MED ORDER — SODIUM CHLORIDE 0.9 % IR SOLN
Status: DC | PRN
  Administered 2020-12-03: 1000 mL

## 2020-12-03 MED ORDER — OXYCODONE HCL 5 MG/5ML PO SOLN: 5.0000 mg | Freq: Once | ORAL | Status: AC | PRN

## 2020-12-03 MED ORDER — PHENYLEPHRINE 40 MCG/ML (10ML) SYRINGE FOR IV PUSH (FOR BLOOD PRESSURE SUPPORT)
PREFILLED_SYRINGE | INTRAVENOUS | Status: DC | PRN
  Administered 2020-12-03 (×3): 80 ug via INTRAVENOUS
  Administered 2020-12-03: 120 ug via INTRAVENOUS

## 2020-12-03 MED ORDER — MIDAZOLAM HCL 2 MG/2ML IJ SOLN: 0.5000 mg | Freq: Once | INTRAMUSCULAR | Status: DC | PRN

## 2020-12-03 MED ORDER — OXYCODONE HCL 5 MG PO TABS
ORAL_TABLET | ORAL | Status: AC
  Filled 2020-12-03: qty 1

## 2020-12-03 MED ORDER — PROMETHAZINE HCL 25 MG/ML IJ SOLN: 6.2500 mg | INTRAMUSCULAR | Status: DC | PRN

## 2020-12-03 MED ORDER — VANCOMYCIN HCL IN DEXTROSE 1-5 GM/200ML-% IV SOLN
1000.0000 mg | Freq: Two times a day (BID) | INTRAVENOUS | Status: DC
  Administered 2020-12-03 – 2020-12-06 (×6): 1000 mg via INTRAVENOUS
  Filled 2020-12-03 (×7): qty 200

## 2020-12-03 MED ORDER — OXYCODONE HCL 5 MG PO TABS
5.0000 mg | ORAL_TABLET | Freq: Once | ORAL | Status: AC | PRN
  Administered 2020-12-03: 5 mg via ORAL

## 2020-12-03 MED ORDER — MUPIROCIN 2 % EX OINT: 1.0000 "application " | TOPICAL_OINTMENT | Freq: Two times a day (BID) | CUTANEOUS | Status: DC

## 2020-12-03 MED ORDER — HYDROMORPHONE HCL 1 MG/ML IJ SOLN: 0.2500 mg | INTRAMUSCULAR | Status: DC | PRN

## 2020-12-03 MED ORDER — SENNOSIDES-DOCUSATE SODIUM 8.6-50 MG PO TABS
1.0000 | ORAL_TABLET | Freq: Two times a day (BID) | ORAL | Status: DC
  Administered 2020-12-03 – 2020-12-06 (×6): 1 via ORAL
  Filled 2020-12-03 (×6): qty 1

## 2020-12-03 MED ORDER — INSULIN ASPART 100 UNIT/ML IJ SOLN
0.0000 [IU] | Freq: Three times a day (TID) | INTRAMUSCULAR | Status: DC
  Administered 2020-12-03: 7 [IU] via SUBCUTANEOUS
  Administered 2020-12-03: 5 [IU] via SUBCUTANEOUS
  Administered 2020-12-04 (×2): 2 [IU] via SUBCUTANEOUS
  Administered 2020-12-05 (×2): 1 [IU] via SUBCUTANEOUS
  Administered 2020-12-06: 2 [IU] via SUBCUTANEOUS

## 2020-12-03 MED ORDER — CARVEDILOL 25 MG PO TABS
25.0000 mg | ORAL_TABLET | Freq: Two times a day (BID) | ORAL | Status: DC
  Administered 2020-12-03 – 2020-12-06 (×7): 25 mg via ORAL
  Filled 2020-12-03 (×7): qty 1

## 2020-12-03 MED ORDER — ONDANSETRON HCL 4 MG/2ML IJ SOLN
INTRAMUSCULAR | Status: DC | PRN
  Administered 2020-12-03: 4 mg via INTRAVENOUS

## 2020-12-03 MED ORDER — PROPOFOL 10 MG/ML IV BOLUS
INTRAVENOUS | Status: DC | PRN
  Administered 2020-12-03: 200 mg via INTRAVENOUS

## 2020-12-03 MED ORDER — ORAL CARE MOUTH RINSE
15.0000 mL | Freq: Two times a day (BID) | OROMUCOSAL | Status: DC
  Administered 2020-12-03 – 2020-12-06 (×7): 15 mL via OROMUCOSAL

## 2020-12-03 MED ORDER — DEXAMETHASONE SODIUM PHOSPHATE 10 MG/ML IJ SOLN
INTRAMUSCULAR | Status: DC | PRN
  Administered 2020-12-03: 10 mg via INTRAVENOUS

## 2020-12-03 MED ORDER — FENTANYL CITRATE (PF) 100 MCG/2ML IJ SOLN
INTRAMUSCULAR | Status: DC | PRN
  Administered 2020-12-03 (×2): 50 ug via INTRAVENOUS

## 2020-12-03 MED ORDER — OXYCODONE HCL 5 MG PO TABS
5.0000 mg | ORAL_TABLET | ORAL | Status: DC | PRN
  Administered 2020-12-06 (×2): 5 mg via ORAL
  Filled 2020-12-03 (×2): qty 1

## 2020-12-03 NOTE — Op Note (Signed)
NAMEBEATRICE, SEHGAL MEDICAL RECORD NO: [id number] ACCOUNT NO: [id number] DATE OF BIRTH: 1950/03/21 FACILITY: [facility] LOCATION: [facility] PHYSICIAN: Sebastian Ache, MD  Operative Report   DATE OF PROCEDURE: 12/02/2020  PREOPERATIVE DIAGNOSIS:  Recurrent scrotal abscess.  PROCEDURE: Incision and drainage of scrotal abscess.  ESTIMATED BLOOD LOSS:  Nil.  COMPLICATIONS:  None.  SPECIMENS:  Scrotal abscess fluid for Gram stain and culture.  FINDINGS:  Multiloculated scrotal abscess main compartment approximately 4 cm to 5 cm2, a satellite compartment approximately 2 cm2. No evidence of communication to the penis or tissue beyond the scrotum, chordee scrotal compartments.  DRAINS:  Wound packing to wound drainage.  INDICATIONS:  The patient is a 70 year old Caucasian man with prior history of scrotal abscess, status post incision and drainage earlier this year.  He was found on workup of fevers, tachycardia and recurrent perineal scrotal pain to have a recurrent  scrotal abscess localized by CT imaging without any obvious tracking to the abdomen, deep scrotal compartments, perirectal area.  Given his systemic infectious parameters, I felt that a formal incision and drainage will be warranted.  He is on blood  thinners.  However, this area was quite superficial and felt it would be safe to proceed cautiously and consideration of reversal if significant bleeding.  Informed consent was obtained and placed in the medical record  PROCEDURE IN DETAIL: The patient being himself and procedure was verified as incision and drainage of scrotal abscess confirmed.  Procedure timeout was performed.  Intravenous antibiotics were administered.  The patient was placed into a low lithotomy  position.  A sterile field was created, prepping, draping his penis, perineum and proximal thighs, scrotum, perianal area using iodine. The area of point of maximum fluctuance and induration was noted in the midline of the  posterior scrotum.  There was a  satellite area that already did have some smaller spontaneous drainage just to the left of this.  This was probed with a culture swab and final fluid was obtained for Gram stain and culture.  This was opened slightly to a total of approximately 1 cm.   It was probed with a surgeon's finger.  This compartment was approximately 2 cm2.  A small counter incision was made in the midline and larger compartment was entered as this really communicated with a smaller component, majority of the abscess fluid had  already been drained via the midline area. Again, this was also palpated and this did not communicate with internal scrotal compartment or the penis or the perirectal area.  Quarter-inch gauze packing was then used to pack each site tautly and a single  U stitch of nylon was placed at each site.  This resulted in excellent hemostasis.  Sponge and needle counts were correct.  Achieved the goal of surgery today. A dressing and an ABD, and mesh underwear was placed.  Procedure was terminated.  The patient  tolerated the procedure well.  No immediate periprocedural complications. The patient was taken to postanesthesia care in stable condition.  Plan for patient admission.  Continue medical therapy, likely dressing exchange over 48 hours.   PAA D: 12/03/2020 12:47:20 am T: 12/03/2020 1:36:00 am  JOB: [id number]/ [id number]

## 2020-12-03 NOTE — Progress Notes (Signed)
Pharmacy Antibiotic Note  Timothy Cantu is a 70 y.o. male admitted on 12/02/2020 with recurrent scrotal abscess s/p I&D.  Pharmacy has been consulted for Vancomycin and Cefepime dosing.  Plan: Cefepime 2g IV q8h  Vancomycin 1g IV q12h Measure Vanc levels as needed.  Goal AUC = 400 - 550. Follow up renal function, culture results, and clinical course.   Height: 6\' 2"  (188 cm) Weight: 121.4 kg (267 lb 10.2 oz) IBW/kg (Calculated) : 82.2  Temp (24hrs), Avg:99.1 F (37.3 C), Min:97.5 F (36.4 C), Max:100.4 F (38 C)  Recent Labs  Lab 12/02/20 1622 12/02/20 1623 12/02/20 2242 12/03/20 0448  WBC 16.3*  --   --  14.0*  CREATININE 1.43*  --   --  1.07  LATICACIDVEN  --  1.6 1.8  --     Estimated Creatinine Clearance: 89 mL/min (by C-G formula based on SCr of 1.07 mg/dL).    No Known Allergies  Antimicrobials this admission:  10/16 Cefepime >> 10/16 Metronidazole  >> 10/16 Vancomycin >>      Dose adjustments this admission:  10/17 Renal function improved, empiric vancomycin adjustment   Microbiology results:  10/16 Surgical PCR screen: MRSA negative, SA positive 10/16 BCx: ngtd  Thank you for allowing pharmacy to be a part of this patient's care.  11/16 PharmD, BCPS Clinical Pharmacist [facility] main pharmacy [phone] 12/03/2020 12:05 PM

## 2020-12-03 NOTE — Progress Notes (Signed)
[facility] called and said they are ready to receive patient for procedure.

## 2020-12-03 NOTE — Brief Op Note (Signed)
12/02/2020 - 12/03/2020  12:42 AM  PATIENT:  Timothy Cantu  70 y.o. male  PRE-OPERATIVE DIAGNOSIS:  scrotal abscess  POST-OPERATIVE DIAGNOSIS:  * No post-op diagnosis entered *  PROCEDURE:  Procedure(s): INCISION AND DRAINAGE ABSCESS (N/A)  SURGEON:  Surgeon(s) and Role:    * Sebastian Ache, MD - Primary  PHYSICIAN ASSISTANT:   ASSISTANTS: none   ANESTHESIA:   general  EBL:  minimal   BLOOD ADMINISTERED:none  DRAINS: none   LOCAL MEDICATIONS USED:  NONE  SPECIMEN:  Source of Specimen:  abscess fluid  DISPOSITION OF SPECIMEN:   microbiology  COUNTS:  YES  TOURNIQUET:  * No tourniquets in log *  DICTATION: .Other Dictation: Dictation Number [id number]  PLAN OF CARE: Admit to inpatient   PATIENT DISPOSITION:  PACU - hemodynamically stable.   Delay start of Pharmacological VTE agent (>24hrs) due to surgical blood loss or risk of bleeding: no

## 2020-12-03 NOTE — Transfer of Care (Signed)
Immediate Anesthesia Transfer of Care Note  Patient: Timothy Cantu  Procedure(s) Performed: INCISION AND DRAINAGE SCROTAL  ABSCESS  Patient Location: PACU  Anesthesia Type:General  Level of Consciousness: drowsy and patient cooperative  Airway & Oxygen Therapy: Patient Spontanous Breathing and Patient connected to face mask oxygen  Post-op Assessment: Report given to RN and Post -op Vital signs reviewed and stable  Post vital signs: Reviewed and stable  Last Vitals:  Vitals Value Taken Time  BP 114/68   Temp    Pulse 101 0100  Resp 24 0100  SpO2 96% 0100    Last Pain:  Vitals:   12/03/20 0000  TempSrc:   PainSc: 0-No pain         Complications: No notable events documented.

## 2020-12-03 NOTE — Progress Notes (Signed)
ANTICOAGULATION CONSULT NOTE - Follow Up Consult  Pharmacy Consult for Warfarin Indication: atrial fibrillation  No Known Allergies  Patient Measurements: Height: 6\' 2"  (188 cm) Weight: 121.4 kg (267 lb 10.2 oz) IBW/kg (Calculated) : 82.2  Vital Signs: Temp: 97.5 F (36.4 C) (10/17 0920) Temp Source: Oral (10/17 0920) BP: 118/80 (10/17 0920) Pulse Rate: 102 (10/17 0920)  Labs: Recent Labs    12/02/20 1622 12/03/20 0448  HGB 15.6 15.5  HCT 47.3 48.1  PLT 190 145*  LABPROT 30.8* 30.8*  INR 3.0* 3.0*  CREATININE 1.43* 1.07    Estimated Creatinine Clearance: 89 mL/min (by C-G formula based on SCr of 1.07 mg/dL).   Medications:  Scheduled:   carvedilol  25 mg Oral BID WC   Chlorhexidine Gluconate Cloth  6 each Topical Daily   insulin aspart  0-9 Units Subcutaneous TID WC   mouth rinse  15 mL Mouth Rinse BID   mupirocin ointment  1 application Nasal BID   oxyCODONE       Infusions:   ceFEPime (MAXIPIME) IV 2 g (12/03/20 0521)   lactated ringers 125 mL/hr at 12/03/20 0624   metronidazole 500 mg (12/03/20 0626)   vancomycin      Assessment: 70 yoM admitted for scrotal abscess, s/p I&D.  PMH is significant for Afib on chronic warfarin anticoagulation.  Pharmacy is consulted to resume warfarin when cleared by urology.    PTA Warfarin 5mg  daily except 2.5mg  on Mondays.  Last dose on 10/16 AM.  Admit INR 3  Today, 12/03/2020: INR 3 CBC: Hgb WNL.  Plt decreased to 145k No bleeding or complications reported. Urology plans to remove sutures and exchange packing on 10/18.   Goal of Therapy:  INR 2-3 Monitor platelets by anticoagulation protocol: Yes   Plan:  Hold warfarin today. Daily PT-INR Follow up with urology when safe to resume anticoagulation.  12/05/2020 PharmD, BCPS Clinical Pharmacist [facility] main pharmacy [phone] 12/03/2020 12:29 PM

## 2020-12-03 NOTE — Progress Notes (Signed)
PROGRESS NOTE    Timothy Cantu  TDD:[id number] DOB: [birth year] DOA: 12/02/2020 PCP: [name], MD    Chief Complaint  Patient presents with   Wound Check    Brief Narrative:  Patient 70 year old gentleman history of diabetes, A. fib, dilated cardiomyopathy with improved EF, hypertension presenting to the ED noted to have a scrotal abscess per CT abdomen and pelvis.  Patient noted to have a leukocytosis.  Seen by urology and taken urgently to the operating room and underwent incision and drainage of scrotal abscess.  Patient placed empirically on IV antibiotics.  Urology following.   Assessment & Plan:   Principal Problem:   Scrotal abscess Active Problems:   Congestive dilated cardiomyopathy (HCC)   ATRIAL FIBRILLATION   Controlled type 2 diabetes mellitus with hyperglycemia (HCC)   #1 recurrent scrotal abscess -Noted on CT abdomen and pelvis. -Patient had presented with thickening of the skin around the scrotal area which has gradually worsened with increasing discharge with pain and swelling. -Patient seen in consultation by urology and underwent incision and drainage of scrotal abscess with cultures obtained perioperatively which are pending. -Continue empiric IV vancomycin, IV cefepime, IV Flagyl pending abscess cultures and sensitivities. -Oxycodone 5 mg p.o. every 4 hours as needed pain. -Continue IV morphine for severe pain. -Per urology.  2.  A. fib -Patient on chronic anticoagulation with Coumadin with INR therapeutic. -Coumadin on hold, urology to advise when anticoagulation may be resumed. -Outpatient follow-up with cardiology.  3.  Hypertension -Continue Coreg. -Continue to hold HCTZ and Cozaar.  4.  History of cardiomyopathy with improved EF -Per cardiology note cardiomyopathy was felt related to alcoholism which improved after alcohol cessation. -Outpatient follow-up with cardiology.  5.  Well-controlled diabetes mellitus type 2 -Hemoglobin A1c  6.2. -CBG of 203 this morning -Continue to hold oral hypoglycemic agents. -Place on Semglee 10 units daily.  SSI.    DVT prophylaxis: SCDs/INR 3.0. Code Status: Full Family Communication: Updated patient.  No family at bedside. Disposition:   Status is: Inpatient  Remains inpatient appropriate because: On IV antibiotics, underwent urological procedure.  Not stable for discharge.       Consultants:  Urology: Dr. Berneice Heinrich 12/02/2020  Procedures:  CT abdomen and pelvis 12/02/2020 Chest x-ray 12/02/2020 Incision and drainage of scrotal abscess Dr. Berneice Heinrich 12/03/2020    Antimicrobials:  IV cefepime 12/02/2020>>>>> IV Flagyl 12/02/2020>>>> IV vancomycin 12/02/2020>>>>>   Subjective: Patient sitting up in bed.  Denies any chest pain.  No shortness of breath.  No abdominal pain.  Left scrotal pain improving.  Objective: Vitals:   12/03/20 0142 12/03/20 0554 12/03/20 0902 12/03/20 0920  BP: 125/81 130/77 118/80 118/80  Pulse: 99 (!) 103  (!) 102  Resp: (!) 21 18  16   Temp: 99.4 F (37.4 C) 97.8 F (36.6 C)  (!) 97.5 F (36.4 C)  TempSrc: Oral   Oral  SpO2: 95% 93%  95%  Weight:      Height:        Intake/Output Summary (Last 24 hours) at 12/03/2020 1223 Last data filed at 12/03/2020 0600 Gross per 24 hour  Intake 2648.28 ml  Output --  Net 2648.28 ml   Filed Weights   12/02/20 1559 12/02/20 2137  Weight: 120.2 kg 121.4 kg    Examination:  General exam: Appears calm and comfortable  Respiratory system: Clear to auscultation. Respiratory effort normal. Cardiovascular system: S1 & S2 heard, RRR. No JVD, murmurs, rubs, gallops or clicks. No pedal edema. Gastrointestinal system: Abdomen is  nondistended, soft and nontender. No organomegaly or masses felt. Normal bowel sounds heard. GU: Scrotum less tenderness, less induration, packing in place. Central nervous system: Alert and oriented. No focal neurological deficits. Extremities: Symmetric 5 x 5 power. Skin:  No rashes, lesions or ulcers Psychiatry: Judgement and insight appear normal. Mood & affect appropriate.     Data Reviewed: I have personally reviewed following labs and imaging studies  CBC: Recent Labs  Lab 12/02/20 1622 12/03/20 0448  WBC 16.3* 14.0*  NEUTROABS 12.0* 12.3*  HGB 15.6 15.5  HCT 47.3 48.1  MCV 91.8 93.4  PLT 190 145*    Basic Metabolic Panel: Recent Labs  Lab 12/02/20 1622 12/03/20 0448  NA 135 135  K 3.8 4.0  CL 103 105  CO2 22 23  GLUCOSE 163* 193*  BUN 29* 19  CREATININE 1.43* 1.07  CALCIUM 8.9 8.7*    GFR: Estimated Creatinine Clearance: 89 mL/min (by C-G formula based on SCr of 1.07 mg/dL).  Liver Function Tests: Recent Labs  Lab 12/02/20 1622 12/03/20 0448  AST 29 25  ALT 35 33  ALKPHOS 72 60  BILITOT 0.9 1.3*  PROT 7.5 7.1  ALBUMIN 4.2 4.1    CBG: Recent Labs  Lab 12/02/20 2251 12/03/20 0059 12/03/20 0327 12/03/20 0721 12/03/20 1147  GLUCAP 132* 143* 184* 203* 254*     Recent Results (from the past 240 hour(s))  Culture, blood (Routine x 2)     Status: None (Preliminary result)   Collection Time: 12/02/20  4:22 PM   Specimen: BLOOD  Result Value Ref Range Status   Specimen Description   Final    BLOOD RIGHT ANTECUBITAL Performed at [facility], [address] [place], [place] [address]    Special Requests   Final    BOTTLES DRAWN AEROBIC AND ANAEROBIC Blood Culture adequate volume Performed at [facility], [address] [place], [place] [address]    Culture   Final    NO GROWTH < 12 HOURS Performed at [facility], [address] [place], [place] [address]    Report Status PENDING  Incomplete  Culture, blood (Routine x 2)     Status: None (Preliminary result)   Collection Time: 12/02/20  5:00 PM   Specimen: BLOOD RIGHT HAND  Result Value Ref Range Status   Specimen Description   Final    BLOOD RIGHT HAND BLOOD Performed at [facility], [address] [place], [place] [address]    Special Requests   Final    Blood Culture adequate volume BOTTLES DRAWN AEROBIC AND ANAEROBIC Performed at [facility], [address] [place], [place] [address]    Culture   Final    NO GROWTH < 12 HOURS Performed at [facility], [address], [place], [place] [address]    Report Status PENDING  Incomplete  Resp Panel by RT-PCR (Flu A&B, Covid) Nasopharyngeal Swab     Status: None   Collection Time: 12/02/20  7:17 PM   Specimen: Nasopharyngeal Swab; Nasopharyngeal(NP) swabs in vial transport medium  Result Value Ref Range Status   SARS Coronavirus 2 by RT PCR NEGATIVE NEGATIVE Final    Comment: (NOTE) SARS-CoV-2 target nucleic acids are NOT DETECTED.  The SARS-CoV-2 RNA is generally detectable in upper respiratory specimens during the acute phase of infection. The lowest concentration of SARS-CoV-2 viral copies this assay can detect is 138 copies/mL. A negative result  does not preclude SARS-Cov-2 infection and should not be used as the sole basis for treatment or other patient management decisions. A negative result may occur with  improper specimen collection/handling, submission of specimen other than nasopharyngeal swab, presence of viral mutation(s) within the areas targeted by this assay, and inadequate number of viral copies(<138 copies/mL). A negative result must be combined with clinical observations, patient history, and epidemiological information. The expected result is Negative.  Fact Sheet for Patients:  BloggerCourse.com  Fact Sheet for Healthcare Providers:  SeriousBroker.it  This test is no t yet approved or cleared by the [place] FDA and  has been authorized for detection and/or diagnosis of SARS-CoV-2 by FDA under an Emergency Use Authorization (EUA). This EUA will remain  in effect (meaning this test can be used) for the duration of the COVID-19 declaration  under Section 564(b)(1) of the Act, 21 U.S.C.section 360bbb-3(b)(1), unless the authorization is terminated  or revoked sooner.       Influenza A by PCR NEGATIVE NEGATIVE Final   Influenza B by PCR NEGATIVE NEGATIVE Final    Comment: (NOTE) The Xpert Xpress SARS-CoV-2/FLU/RSV plus assay is intended as an aid in the diagnosis of influenza from Nasopharyngeal swab specimens and should not be used as a sole basis for treatment. Nasal washings and aspirates are unacceptable for Xpert Xpress SARS-CoV-2/FLU/RSV testing.  Fact Sheet for Patients: BloggerCourse.com  Fact Sheet for Healthcare Providers: SeriousBroker.it  This test is not yet approved or cleared by the [place] FDA and has been authorized for detection and/or diagnosis of SARS-CoV-2 by FDA under an Emergency Use Authorization (EUA). This EUA will remain in effect (meaning this test can be used) for the duration of the COVID-19 declaration under Section 564(b)(1) of the Act, 21 U.S.C. section 360bbb-3(b)(1), unless the authorization is terminated or revoked.  Performed at [facility], [address], [place], [place] [address]   Surgical PCR screen     Status: Abnormal   Collection Time: 12/02/20 10:39 PM   Specimen: Nasal Mucosa; Nasal Swab  Result Value Ref Range Status   MRSA, PCR NEGATIVE NEGATIVE Final   Staphylococcus aureus POSITIVE (A) NEGATIVE Final    Comment: RESULT CALLED TO, READ BACK BY AND VERIFIED WITH: EVANS,H. 12/03/20 @02 :27 SEEL,M. (NOTE) The Xpert SA Assay (FDA approved for NASAL specimens in patients 70 years of age and older), is one component of a comprehensive surveillance program. It is not intended to diagnose infection nor to guide or monitor treatment. Performed at [facility], [address], [place], [place]          Radiology Studies: DG Chest 2 View  Result Date:  12/02/2020 CLINICAL DATA:  Suspected sepsis EXAM: CHEST - 2 VIEW COMPARISON:  08/31/2017 FINDINGS: Heart and mediastinal contours are within normal limits. No focal opacities or effusions. No acute bony abnormality. IMPRESSION: No active cardiopulmonary disease. Electronically Signed   By: 09/02/2017 M.D.   On: 12/02/2020 18:12   CT ABDOMEN PELVIS W CONTRAST  Result Date: 12/02/2020 CLINICAL DATA:  History of scrotal abscess, now with increased swelling and drainage. EXAM: CT ABDOMEN AND PELVIS WITH CONTRAST TECHNIQUE: Multidetector CT imaging of the abdomen and pelvis was performed using the standard protocol following bolus administration of intravenous contrast. CONTRAST:  12/04/2020 OMNIPAQUE IOHEXOL 300 MG/ML  SOLN COMPARISON:  CT May 20, 2020 FINDINGS: Lower chest: Bibasilar atelectasis versus scarring. Hepatobiliary: No suspicious hepatic lesion. Cholelithiasis without findings of acute cholecystitis. No biliary  ductal dilation. Pancreas: No pancreatic ductal dilation or evidence of acute inflammation. Spleen: Within normal limits. Adrenals/Urinary Tract: Adrenal glands are unremarkable. Kidneys are normal, without renal calculi, solid enhancing lesion, or hydronephrosis. Bladder is unremarkable for degree of distension. Stomach/Bowel: Small hiatal hernia otherwise the stomach is unremarkable for degree of distension. No pathologic dilation of small or large bowel. The appendix and terminal ileum appear normal. No evidence of acute bowel inflammation. Vascular/Lymphatic: Aortic and branch vessel atherosclerosis without aneurysmal dilation. No pathologically enlarged abdominal or pelvic lymph nodes. Reproductive: There is a rim enhancing gas and fluid collection in the scrotum posterosuperior to the testicle in posterior to the penile shaft common same location as the previously drained scrotal abscess now measuring 5.8 x 3.7 cm on image 111/8 previously 5.6 x 6.9 cm. There is a new thick walled fluid  collection posterior to this along the left lateral aspect of the scrotum which measures 4.4 x 1.4 cm on image 116/8, this appears to track to the cutaneous skin surface along the left posterior hemiscrotum on image 121/8. Bilateral hydroceles. Prostate glands unremarkable. Other: No abdominopelvic free fluid. Musculoskeletal: Multilevel degenerative changes spine. No acute osseous abnormality. IMPRESSION: 1. Scrotal abscess similar location but decreased in size in comparison to the previously drained scrotal abscess now measuring 5.8 x 3.7 cm. 2. New thick walled fluid collection posterior to this along the left lateral aspect of the scrotum which measures 4.4 x 1.4 cm, this appears to track to the cutaneous skin surface along the left posterior hemiscrotum. Findings also likely reflect a scrotal abscess. 3. Bilateral hydroceles. 4. Cholelithiasis without acute cholecystitis. 5. Aortic Atherosclerosis (ICD10-I70.0). Electronically Signed   By: Maudry Mayhew M.D.   On: 12/02/2020 18:27        Scheduled Meds:  carvedilol  25 mg Oral BID WC   Chlorhexidine Gluconate Cloth  6 each Topical Daily   insulin aspart  0-9 Units Subcutaneous TID WC   mouth rinse  15 mL Mouth Rinse BID   mupirocin ointment  1 application Nasal BID   oxyCODONE       Continuous Infusions:  ceFEPime (MAXIPIME) IV 2 g (12/03/20 0521)   lactated ringers 125 mL/hr at 12/03/20 2836   metronidazole 500 mg (12/03/20 0626)   vancomycin       LOS: 1 day    Time spent: 35 minutes    Ramiro Harvest, MD [facility]   To contact the attending provider between 7A-7P or the covering provider during after hours 7P-7A, please log into the web site [email or web address] and access using universal [place] password for that web site. If you do not have the password, please call the hospital operator.  12/03/2020, 12:23 PM

## 2020-12-03 NOTE — Anesthesia Postprocedure Evaluation (Signed)
Anesthesia Post Note  Patient: Joshia Kitchings  Procedure(s) Performed: INCISION AND DRAINAGE SCROTAL  ABSCESS     Patient location during evaluation: PACU Anesthesia Type: General Level of consciousness: awake and alert, patient cooperative and oriented Pain management: pain level controlled Vital Signs Assessment: post-procedure vital signs reviewed and stable Respiratory status: spontaneous breathing, nonlabored ventilation and respiratory function stable Cardiovascular status: blood pressure returned to baseline and stable Postop Assessment: no apparent nausea or vomiting Anesthetic complications: no   No notable events documented.  Last Vitals:  Vitals:   12/03/20 0057 12/03/20 0102  BP: 111/72 114/68  Pulse: (!) 111 (!) 105  Resp: (!) 21 13  Temp: (!) 38 C   SpO2: 99% 95%    Last Pain:  Vitals:   12/03/20 0000  TempSrc:   PainSc: 0-No pain                 Americus Scheurich,E. Shaianne Nucci

## 2020-12-03 NOTE — Progress Notes (Signed)
Received report from PACU that patient is ready to return to patient's room from procedure.

## 2020-12-03 NOTE — Anesthesia Procedure Notes (Signed)
Procedure Name: LMA Insertion Date/Time: 12/03/2020 12:15 AM Performed by: Yolonda Kida, CRNA Pre-anesthesia Checklist: Patient identified, Emergency Drugs available, Suction available and Patient being monitored Patient Re-evaluated:Patient Re-evaluated prior to induction Oxygen Delivery Method: Circle system utilized Preoxygenation: Pre-oxygenation with 100% oxygen Induction Type: IV induction Ventilation: Mask ventilation without difficulty LMA: LMA inserted LMA Size: 4.0 Number of attempts: 1 Placement Confirmation: positive ETCO2 and breath sounds checked- equal and bilateral Tube secured with: Tape Dental Injury: Teeth and Oropharynx as per pre-operative assessment

## 2020-12-03 NOTE — Progress Notes (Signed)
Urology Progress Note   1 Day Post-Op from scrotal I&D.   Subjective: NAEON.  Since OR he has been AF, HDS Pain much improved and well controlled Wounds are hemostatic  Objective: Vital signs in last 24 hours: Temp:  [97.8 F (36.6 C)-100.4 F (38 C)] 97.8 F (36.6 C) (10/17 0554) Pulse Rate:  [99-127] 103 (10/17 0554) Resp:  [13-30] 18 (10/17 0554) BP: (111-156)/(68-95) 130/77 (10/17 0554) SpO2:  [93 %-99 %] 93 % (10/17 0554) Weight:  [120.2 kg-121.4 kg] 121.4 kg (10/16 2137)  Intake/Output from previous day: 10/16 0701 - 10/17 0700 In: 2137.9 [I.V.:916.7; IV Piggyback:1221.2] Out: -  Intake/Output this shift: No intake/output data recorded.  Physical Exam:  General: Alert and oriented CV: Regular rate Lungs: No increased work of breathing Abdomen: Soft, appropriately tender. GU: Significantly less induration and tenderness of scrotum. Two incisions in the posterior scrotum w/ packing in place, which are hemostatic. Ext: NT, No erythema  Lab Results: Recent Labs    12/02/20 1622 12/03/20 0448  HGB 15.6 15.5  HCT 47.3 48.1   Recent Labs    12/02/20 1622 12/03/20 0448  NA 135 135  K 3.8 4.0  CL 103 105  CO2 22 23  GLUCOSE 163* 193*  BUN 29* 19  CREATININE 1.43* 1.07  CALCIUM 8.9 8.7*    Studies/Results: DG Chest 2 View  Result Date: 12/02/2020 CLINICAL DATA:  Suspected sepsis EXAM: CHEST - 2 VIEW COMPARISON:  08/31/2017 FINDINGS: Heart and mediastinal contours are within normal limits. No focal opacities or effusions. No acute bony abnormality. IMPRESSION: No active cardiopulmonary disease. Electronically Signed   By: Charlett Nose M.D.   On: 12/02/2020 18:12   CT ABDOMEN PELVIS W CONTRAST  Result Date: 12/02/2020 CLINICAL DATA:  History of scrotal abscess, now with increased swelling and drainage. EXAM: CT ABDOMEN AND PELVIS WITH CONTRAST TECHNIQUE: Multidetector CT imaging of the abdomen and pelvis was performed using the standard protocol following  bolus administration of intravenous contrast. CONTRAST:  OMNIPAQUE IOHEXOL 300 MG/ML  SOLN COMPARISON:  CT May 20, 2020 FINDINGS: Lower chest: Bibasilar atelectasis versus scarring. Hepatobiliary: No suspicious hepatic lesion. Cholelithiasis without findings of acute cholecystitis. No biliary ductal dilation. Pancreas: No pancreatic ductal dilation or evidence of acute inflammation. Spleen: Within normal limits. Adrenals/Urinary Tract: Adrenal glands are unremarkable. Kidneys are normal, without renal calculi, solid enhancing lesion, or hydronephrosis. Bladder is unremarkable for degree of distension. Stomach/Bowel: Small hiatal hernia otherwise the stomach is unremarkable for degree of distension. No pathologic dilation of small or large bowel. The appendix and terminal ileum appear normal. No evidence of acute bowel inflammation. Vascular/Lymphatic: Aortic and branch vessel atherosclerosis without aneurysmal dilation. No pathologically enlarged abdominal or pelvic lymph nodes. Reproductive: There is a rim enhancing gas and fluid collection in the scrotum posterosuperior to the testicle in posterior to the penile shaft common same location as the previously drained scrotal abscess now measuring 5.8 x 3.7 cm on image 111/8 previously 5.6 x 6.9 cm. There is a new thick walled fluid collection posterior to this along the left lateral aspect of the scrotum which measures 4.4 x 1.4 cm on image 116/8, this appears to track to the cutaneous skin surface along the left posterior hemiscrotum on image 121/8. Bilateral hydroceles. Prostate glands unremarkable. Other: No abdominopelvic free fluid. Musculoskeletal: Multilevel degenerative changes spine. No acute osseous abnormality. IMPRESSION: 1. Scrotal abscess similar location but decreased in size in comparison to the previously drained scrotal abscess now measuring 5.8 x 3.7 cm.  2. New thick walled fluid collection posterior to this along the left lateral aspect of  the scrotum which measures 4.4 x 1.4 cm, this appears to track to the cutaneous skin surface along the left posterior hemiscrotum. Findings also likely reflect a scrotal abscess. 3. Bilateral hydroceles. 4. Cholelithiasis without acute cholecystitis. 5. Aortic Atherosclerosis (ICD10-I70.0). Electronically Signed   By: Maudry Mayhew M.D.   On: 12/02/2020 18:27    Assessment/Plan:  70 y.o. male s/p scrotal I&D.  Overall doing well post-op.   - Recommend leaving packing and hemostatic sutures in place through today with expectation to remove the sutures and exchange the packing tomorrow (10/18) - Follow up cultures and tailor antibiotics as appropriate - Expect discharge on oral course of antibiotics w/ daily scrotal dressing changes and outpatient Urology follow up    LOS: 1 day   Reola Mosher, MD [facility] Urology Resident, [facility]

## 2020-12-03 NOTE — Plan of Care (Signed)

## 2020-12-03 NOTE — Progress Notes (Signed)
Patient returned to patient's room. Vital signs were taken. Reassessed patient when patient returned to room, and no change from prior assessment other than 2L/min of oxygen to keep oxygen saturation 92% and above from surgical procedure and the incision and drainage scrotal abscess area packed.

## 2020-12-04 DIAGNOSIS — R791 Abnormal coagulation profile: Secondary | ICD-10-CM

## 2020-12-04 LAB — CBC WITH DIFFERENTIAL/PLATELET
Abs Immature Granulocytes: 0.24 10*3/uL — ABNORMAL HIGH (ref 0.00–0.07)
Basophils Absolute: 0 10*3/uL (ref 0.0–0.1)
Basophils Relative: 0 %
Eosinophils Absolute: 0 10*3/uL (ref 0.0–0.5)
Eosinophils Relative: 0 %
HCT: 42.8 % (ref 39.0–52.0)
Hemoglobin: 13.6 g/dL (ref 13.0–17.0)
Immature Granulocytes: 1 %
Lymphocytes Relative: 8 %
Lymphs Abs: 1.6 10*3/uL (ref 0.7–4.0)
MCH: 29.7 pg (ref 26.0–34.0)
MCHC: 31.8 g/dL (ref 30.0–36.0)
MCV: 93.4 fL (ref 80.0–100.0)
Monocytes Absolute: 1.1 10*3/uL — ABNORMAL HIGH (ref 0.1–1.0)
Monocytes Relative: 6 %
Neutro Abs: 16.2 10*3/uL — ABNORMAL HIGH (ref 1.7–7.7)
Neutrophils Relative %: 85 %
Platelets: 162 10*3/uL (ref 150–400)
RBC: 4.58 MIL/uL (ref 4.22–5.81)
RDW: 13.7 % (ref 11.5–15.5)
WBC: 19.1 10*3/uL — ABNORMAL HIGH (ref 4.0–10.5)
nRBC: 0 % (ref 0.0–0.2)

## 2020-12-04 LAB — PROTIME-INR
INR: 3.7 — ABNORMAL HIGH (ref 0.8–1.2)
Prothrombin Time: 36.5 seconds — ABNORMAL HIGH (ref 11.4–15.2)

## 2020-12-04 LAB — GLUCOSE, CAPILLARY
Glucose-Capillary: 110 mg/dL — ABNORMAL HIGH (ref 70–99)
Glucose-Capillary: 173 mg/dL — ABNORMAL HIGH (ref 70–99)
Glucose-Capillary: 174 mg/dL — ABNORMAL HIGH (ref 70–99)
Glucose-Capillary: 179 mg/dL — ABNORMAL HIGH (ref 70–99)
Glucose-Capillary: 204 mg/dL — ABNORMAL HIGH (ref 70–99)

## 2020-12-04 LAB — BASIC METABOLIC PANEL
Anion gap: 9 (ref 5–15)
BUN: 22 mg/dL (ref 8–23)
CO2: 23 mmol/L (ref 22–32)
Calcium: 8.5 mg/dL — ABNORMAL LOW (ref 8.9–10.3)
Chloride: 104 mmol/L (ref 98–111)
Creatinine, Ser: 1.1 mg/dL (ref 0.61–1.24)
GFR, Estimated: >60 mL/min (ref >60)
Glucose, Bld: 204 mg/dL — ABNORMAL HIGH (ref 70–99)
Potassium: 3.8 mmol/L (ref 3.5–5.1)
Sodium: 136 mmol/L (ref 135–145)

## 2020-12-04 NOTE — Progress Notes (Signed)
ANTICOAGULATION CONSULT NOTE - Follow Up Consult  Pharmacy Consult for Warfarin Indication: atrial fibrillation  No Known Allergies  Patient Measurements: Height: 6\' 2"  (188 cm) Weight: 121.4 kg (267 lb 10.2 oz) IBW/kg (Calculated) : 82.2  Vital Signs: Temp: 97.8 F (36.6 C) (10/18 0608) BP: 128/82 (10/18 0608) Pulse Rate: 99 (10/18 0608)  Labs: Recent Labs    12/02/20 1622 12/03/20 0448 12/04/20 0404  HGB 15.6 15.5 13.6  HCT 47.3 48.1 42.8  PLT 190 145* 162  LABPROT 30.8* 30.8* 36.5*  INR 3.0* 3.0* 3.7*  CREATININE 1.43* 1.07 1.10     Estimated Creatinine Clearance: 86.5 mL/min (by C-G formula based on SCr of 1.1 mg/dL).   Medications:  Scheduled:   carvedilol  25 mg Oral BID WC   Chlorhexidine Gluconate Cloth  6 each Topical Daily   insulin aspart  0-9 Units Subcutaneous TID WC   insulin glargine-yfgn  10 Units Subcutaneous QHS   mouth rinse  15 mL Mouth Rinse BID   mupirocin ointment  1 application Nasal BID   senna-docusate  1 tablet Oral BID   Infusions:   ceFEPime (MAXIPIME) IV 2 g (12/04/20 0636)   metronidazole 500 mg (12/04/20 0535)   vancomycin 1,000 mg (12/03/20 1950)    Assessment: 70 yoM admitted for scrotal abscess, s/p I&D.  PMH is significant for Afib on chronic warfarin anticoagulation.  Pharmacy is consulted to resume warfarin when cleared by urology.    PTA Warfarin 5mg  daily except 2.5mg  on Mondays.  Last dose on 10/16 AM.   Admit INR 3  Today, 12/04/2020: INR increased to supra-therapeutic, 3.7 CBC: Hgb and Plt WNL No bleeding or complications reported.  Urology plans to remove sutures and exchange packing on 10/18 per notes.  Drug-drug interaction: expect significant INR increase due to DDI with metronidazole.  Other antibiotics may increase INR    Goal of Therapy:  INR 2-3 Monitor platelets by anticoagulation protocol: Yes   Plan:  Hold warfarin today. Daily PT-INR Follow up with urology when safe to resume  anticoagulation.  12/06/2020 PharmD, BCPS Clinical Pharmacist [facility] main pharmacy [phone] 12/04/2020 8:19 AM

## 2020-12-04 NOTE — Progress Notes (Addendum)
PROGRESS NOTE    Timothy Cantu  JGO:[id number] DOB: 1950-12-05 DOA: 12/02/2020 PCP: [name], MD    Chief Complaint  Patient presents with   Wound Check    Brief Narrative:  Patient 70 year old gentleman history of diabetes, A. fib, dilated cardiomyopathy with improved EF, hypertension presenting to the ED noted to have a scrotal abscess per CT abdomen and pelvis.  Patient noted to have a leukocytosis.  Seen by urology and taken urgently to the operating room and underwent incision and drainage of scrotal abscess.  Patient placed empirically on IV antibiotics.  Urology following.   Assessment & Plan:   Principal Problem:   Scrotal abscess Active Problems:   Congestive dilated cardiomyopathy (HCC)   ATRIAL FIBRILLATION   Controlled type 2 diabetes mellitus with hyperglycemia (HCC)   1 recurrent scrotal abscess -Noted on CT abdomen and pelvis. -Patient had presented with thickening of the skin around the scrotal area which has gradually worsened with increasing discharge with pain and swelling. -Patient seen in consultation by urology and underwent incision and drainage of scrotal abscess with cultures obtained perioperatively with preliminary results with gram-negative rods and still pending. -Leukocytosis worsening, patient however afebrile and clinically feels he is improving. -Continue empiric IV vancomycin, IV cefepime, pending abscess cultures and sensitivities. -Discontinue Flagyl. -Continue oxycodone 5 mg p.o. every 4 hours as needed pain. -Continue IV morphine for severe pain. -Per urology.  2.  A. fib -Patient on chronic anticoagulation with Coumadin with INR supratherapeutic (likely interaction between Flagyl and Coumadin). -Coumadin on hold, urology to advise when anticoagulation may be resumed. -Discontinue Flagyl. -Outpatient follow-up with cardiology.  3.  Hypertension -Controlled on current regimen of Coreg.   -Continue to hold HCTZ and Cozaar.     4.  History of cardiomyopathy with improved EF -Per cardiology note cardiomyopathy was felt related to alcoholism which improved after alcohol cessation. -Outpatient follow-up with cardiology.  5.  Well-controlled diabetes mellitus type 2 -Hemoglobin A1c 6.2. -CBG of 179 this morning -Continue to hold oral hypoglycemic agents. -Continue Semglee 10 units daily, SSI.   6.  Supratherapeutic INR -INR at 3.7 today from 3.0 yesterday. -Likely interaction between Coumadin and Flagyl. -Patient with no overt bleeding. -Cultures pending with blood cultures with no growth to date, wound cultures from scrotum with rare gram-negative rods. -Discontinue Flagyl.    DVT prophylaxis: SCDs/INR 3.7. Code Status: Full Family Communication: Updated patient.  No family at bedside. Disposition:   Status is: Inpatient  Remains inpatient appropriate because: On IV antibiotics, underwent urological procedure.  Not stable for discharge.       Consultants:  Urology: Dr. Berneice Heinrich 12/02/2020  Procedures:  CT abdomen and pelvis 12/02/2020 Chest x-ray 12/02/2020 Incision and drainage of scrotal abscess Dr. Berneice Heinrich 12/03/2020    Antimicrobials:  IV cefepime 12/02/2020>>>>> IV Flagyl 12/02/2020>>>> 12/04/2020 IV vancomycin 12/02/2020>>>>>   Subjective: Patient laying in bed.  Denies any chest pain.  No shortness of breath.  No abdominal pain.  Denies any significant pain in the scrotum region.  Feeling 100% better.  iPad interpreter [place]) used.  Patient denies any bleeding.  Patient asking when he is going to be able to go home  Objective: Vitals:   12/03/20 1746 12/03/20 2054 12/04/20 0608 12/04/20 1248  BP: 117/69 113/69 128/82 127/79  Pulse: (!) 101 (!) 102 99 98  Resp:  18 18 16   Temp:  (!) 97.5 F (36.4 C) 97.8 F (36.6 C) (!) 97.5 F (36.4 C)  TempSrc:    Oral  SpO2:  95% 96% 97%  Weight:      Height:        Intake/Output Summary (Last 24 hours) at 12/04/2020 1313 Last  data filed at 12/04/2020 0900 Gross per 24 hour  Intake 516.44 ml  Output --  Net 516.44 ml    Filed Weights   12/02/20 1559 12/02/20 2137  Weight: 120.2 kg 121.4 kg    Examination:  General exam: : NAD Respiratory system: CTA B.  No wheezes, no rhonchi.  Speaking in full sentences.  Normal respiratory effort. Cardiovascular system: Regular rate and rhythm no murmurs rubs or gallops.  No JVD.  No lower extremity edema.  Gastrointestinal system: Abdomen soft, nontender, nondistended, positive bowel sounds.  No rebound.  No guarding. Genitourinary: Improved swelling, dressing intact with packing noted in scrotum. Central nervous system: Alert and oriented. No focal neurological deficits. Extremities: Symmetric 5 x 5 power. Skin: No rashes, lesions or ulcers Psychiatry: Judgement and insight appear normal. Mood & affect appropriate.  Data Reviewed: I have personally reviewed following labs and imaging studies  CBC: Recent Labs  Lab 12/02/20 1622 12/03/20 0448 12/04/20 0404  WBC 16.3* 14.0* 19.1*  NEUTROABS 12.0* 12.3* 16.2*  HGB 15.6 15.5 13.6  HCT 47.3 48.1 42.8  MCV 91.8 93.4 93.4  PLT 190 145* 162     Basic Metabolic Panel: Recent Labs  Lab 12/02/20 1622 12/03/20 0448 12/04/20 0404  NA 135 135 136  K 3.8 4.0 3.8  CL 103 105 104  CO2 22 23 23   GLUCOSE 163* 193* 204*  BUN 29* 19 22  CREATININE 1.43* 1.07 1.10  CALCIUM 8.9 8.7* 8.5*     GFR: Estimated Creatinine Clearance: 86.5 mL/min (by C-G formula based on SCr of 1.1 mg/dL).  Liver Function Tests: Recent Labs  Lab 12/02/20 1622 12/03/20 0448  AST 29 25  ALT 35 33  ALKPHOS 72 60  BILITOT 0.9 1.3*  PROT 7.5 7.1  ALBUMIN 4.2 4.1     CBG: Recent Labs  Lab 12/03/20 1643 12/03/20 2055 12/04/20 0003 12/04/20 0737 12/04/20 1127  GLUCAP 326* 303* 204* 179* 174*      Recent Results (from the past 240 hour(s))  Culture, blood (Routine x 2)     Status: None (Preliminary result)    Collection Time: 12/02/20  4:22 PM   Specimen: BLOOD  Result Value Ref Range Status   Specimen Description   Final    BLOOD RIGHT ANTECUBITAL Performed at [facility], [address] [place], [place]    Special Requests   Final    BOTTLES DRAWN AEROBIC AND ANAEROBIC Blood Culture adequate volume Performed at [facility], [address] [place], [place]    Culture   Final    NO GROWTH 2 DAYS Performed at [facility], [address], [place], [place]    Report Status PENDING  Incomplete  Culture, blood (Routine x 2)     Status: None (Preliminary result)   Collection Time: 12/02/20  5:00 PM   Specimen: BLOOD RIGHT HAND  Result Value Ref Range Status   Specimen Description   Final    BLOOD RIGHT HAND BLOOD Performed at [facility] [address] [place], [place]    Special Requests   Final    Blood Culture adequate volume BOTTLES DRAWN AEROBIC AND ANAEROBIC Performed at [facility], [address], [place], [place]  Culture   Final    NO GROWTH 2 DAYS Performed at [facility], [address] [place], [place] [address]    Report Status PENDING  Incomplete  Resp Panel by RT-PCR (Flu A&B, Covid) Nasopharyngeal Swab     Status: None   Collection Time: 12/02/20  7:17 PM   Specimen: Nasopharyngeal Swab; Nasopharyngeal(NP) swabs in vial transport medium  Result Value Ref Range Status   SARS Coronavirus 2 by RT PCR NEGATIVE NEGATIVE Final    Comment: (NOTE) SARS-CoV-2 target nucleic acids are NOT DETECTED.  The SARS-CoV-2 RNA is generally detectable in upper respiratory specimens during the acute phase of infection. The lowest concentration of SARS-CoV-2 viral copies this assay can detect is 138 copies/mL. A negative result does not preclude SARS-Cov-2 infection and should not be used as the sole basis for treatment or other patient management decisions. A negative  result may occur with  improper specimen collection/handling, submission of specimen other than nasopharyngeal swab, presence of viral mutation(s) within the areas targeted by this assay, and inadequate number of viral copies(<138 copies/mL). A negative result must be combined with clinical observations, patient history, and epidemiological information. The expected result is Negative.  Fact Sheet for Patients:  BloggerCourse.com  Fact Sheet for Healthcare Providers:  SeriousBroker.it  This test is no t yet approved or cleared by the [place] FDA and  has been authorized for detection and/or diagnosis of SARS-CoV-2 by FDA under an Emergency Use Authorization (EUA). This EUA will remain  in effect (meaning this test can be used) for the duration of the COVID-19 declaration under Section 564(b)(1) of the Act, 21 U.S.C.section 360bbb-3(b)(1), unless the authorization is terminated  or revoked sooner.       Influenza A by PCR NEGATIVE NEGATIVE Final   Influenza B by PCR NEGATIVE NEGATIVE Final    Comment: (NOTE) The Xpert Xpress SARS-CoV-2/FLU/RSV plus assay is intended as an aid in the diagnosis of influenza from Nasopharyngeal swab specimens and should not be used as a sole basis for treatment. Nasal washings and aspirates are unacceptable for Xpert Xpress SARS-CoV-2/FLU/RSV testing.  Fact Sheet for Patients: BloggerCourse.com  Fact Sheet for Healthcare Providers: SeriousBroker.it  This test is not yet approved or cleared by the [place] FDA and has been authorized for detection and/or diagnosis of SARS-CoV-2 by FDA under an Emergency Use Authorization (EUA). This EUA will remain in effect (meaning this test can be used) for the duration of the COVID-19 declaration under Section 564(b)(1) of the Act, 21 U.S.C. section 360bbb-3(b)(1), unless the authorization is  terminated or revoked.  Performed at [facility], [address] [place], [place] [address]   Surgical PCR screen     Status: Abnormal   Collection Time: 12/02/20 10:39 PM   Specimen: Nasal Mucosa; Nasal Swab  Result Value Ref Range Status   MRSA, PCR NEGATIVE NEGATIVE Final   Staphylococcus aureus POSITIVE (A) NEGATIVE Final    Comment: RESULT CALLED TO, READ BACK BY AND VERIFIED WITH: EVANS,H. 12/03/20 @02 :27 SEEL,M. (NOTE) The Xpert SA Assay (FDA approved for NASAL specimens in patients 35 years of age and older), is one component of a comprehensive surveillance program. It is not intended to diagnose infection nor to guide or monitor treatment. Performed at [facility], [address] [place], [place]   Aerobic/Anaerobic Culture w Gram Stain (surgical/deep wound)     Status: None (Preliminary result)   Collection Time: 12/03/20 12:29 AM  Specimen: Abscess  Result Value Ref Range Status   Specimen Description   Final    ABSCESS SCROTAL Performed at [facility], [address], [place], [place] [address]    Special Requests   Final    NONE Performed at [facility], [address] [place], [place] [address]    Gram Stain   Final    ABUNDANT WBC PRESENT, PREDOMINANTLY PMN RARE GRAM NEGATIVE RODS    Culture   Final    CULTURE REINCUBATED FOR BETTER GROWTH Performed at [facility], [address] [place], [place] [address]    Report Status PENDING  Incomplete          Radiology Studies: DG Chest 2 View  Result Date: 12/02/2020 CLINICAL DATA:  Suspected sepsis EXAM: CHEST - 2 VIEW COMPARISON:  08/31/2017 FINDINGS: Heart and mediastinal contours are within normal limits. No focal opacities or effusions. No acute bony abnormality. IMPRESSION: No active cardiopulmonary disease. Electronically Signed   By: Charlett Nose M.D.   On: 12/02/2020 18:12   CT ABDOMEN PELVIS W  CONTRAST  Result Date: 12/02/2020 CLINICAL DATA:  History of scrotal abscess, now with increased swelling and drainage. EXAM: CT ABDOMEN AND PELVIS WITH CONTRAST TECHNIQUE: Multidetector CT imaging of the abdomen and pelvis was performed using the standard protocol following bolus administration of intravenous contrast. CONTRAST:  OMNIPAQUE IOHEXOL 300 MG/ML  SOLN COMPARISON:  CT May 20, 2020 FINDINGS: Lower chest: Bibasilar atelectasis versus scarring. Hepatobiliary: No suspicious hepatic lesion. Cholelithiasis without findings of acute cholecystitis. No biliary ductal dilation. Pancreas: No pancreatic ductal dilation or evidence of acute inflammation. Spleen: Within normal limits. Adrenals/Urinary Tract: Adrenal glands are unremarkable. Kidneys are normal, without renal calculi, solid enhancing lesion, or hydronephrosis. Bladder is unremarkable for degree of distension. Stomach/Bowel: Small hiatal hernia otherwise the stomach is unremarkable for degree of distension. No pathologic dilation of small or large bowel. The appendix and terminal ileum appear normal. No evidence of acute bowel inflammation. Vascular/Lymphatic: Aortic and branch vessel atherosclerosis without aneurysmal dilation. No pathologically enlarged abdominal or pelvic lymph nodes. Reproductive: There is a rim enhancing gas and fluid collection in the scrotum posterosuperior to the testicle in posterior to the penile shaft common same location as the previously drained scrotal abscess now measuring 5.8 x 3.7 cm on image 111/8 previously 5.6 x 6.9 cm. There is a new thick walled fluid collection posterior to this along the left lateral aspect of the scrotum which measures 4.4 x 1.4 cm on image 116/8, this appears to track to the cutaneous skin surface along the left posterior hemiscrotum on image 121/8. Bilateral hydroceles. Prostate glands unremarkable. Other: No abdominopelvic free fluid. Musculoskeletal: Multilevel degenerative changes  spine. No acute osseous abnormality. IMPRESSION: 1. Scrotal abscess similar location but decreased in size in comparison to the previously drained scrotal abscess now measuring 5.8 x 3.7 cm. 2. New thick walled fluid collection posterior to this along the left lateral aspect of the scrotum which measures 4.4 x 1.4 cm, this appears to track to the cutaneous skin surface along the left posterior hemiscrotum. Findings also likely reflect a scrotal abscess. 3. Bilateral hydroceles. 4. Cholelithiasis without acute cholecystitis. 5. Aortic Atherosclerosis (ICD10-I70.0). Electronically Signed   By: Maudry Mayhew M.D.   On: 12/02/2020 18:27        Scheduled Meds:  carvedilol  25 mg Oral BID WC   Chlorhexidine Gluconate Cloth  6 each Topical Daily   insulin aspart  0-9  Units Subcutaneous TID WC   insulin glargine-yfgn  10 Units Subcutaneous QHS   mouth rinse  15 mL Mouth Rinse BID   mupirocin ointment  1 application Nasal BID   senna-docusate  1 tablet Oral BID   Continuous Infusions:  ceFEPime (MAXIPIME) IV 2 g (12/04/20 0636)   vancomycin 1,000 mg (12/04/20 0826)     LOS: 2 days    Time spent: 35 minutes    Ramiro Harvest, MD [facility]   To contact the attending provider between 7A-7P or the covering provider during after hours 7P-7A, please log into the web site [email or web address] and access using universal [place] password for that web site. If you do not have the password, please call the hospital operator.  12/04/2020, 1:13 PM

## 2020-12-04 NOTE — Plan of Care (Signed)

## 2020-12-04 NOTE — Progress Notes (Signed)
Urology Progress Note   2 Days Post-Op from scrotal I&D.   Subjective: NAEON.  AF, HDS Exam improved Wounds are hemostatic, sutures removed at bedside this AM Packing changed by nursing staff and family educated Increase in leukocytosis to 19.1 Bcx's no growth. Wound culture w/ GNR's, results pending.  Objective: Vital signs in last 24 hours: Temp:  [97.5 F (36.4 C)-97.8 F (36.6 C)] 97.5 F (36.4 C) (10/18 1248) Pulse Rate:  [98-102] 98 (10/18 1248) Resp:  [16-18] 16 (10/18 1248) BP: (113-128)/(69-82) 127/79 (10/18 1248) SpO2:  [95 %-97 %] 97 % (10/18 1248)  Intake/Output from previous day: 10/17 0701 - 10/18 0700 In: 416.4 [IV Piggyback:416.4] Out: -  Intake/Output this shift: Total I/O In: 100 [P.O.:100] Out: -   Physical Exam:  General: Alert and oriented CV: Regular rate Lungs: No increased work of breathing Abdomen: Soft, appropriately tender. GU: Significantly less induration and tenderness of scrotum. Two incisions in the posterior scrotum w/ packing in place, which are hemostatic. Ext: NT, No erythema  Lab Results: Recent Labs    12/02/20 1622 12/03/20 0448 12/04/20 0404  HGB 15.6 15.5 13.6  HCT 47.3 48.1 42.8    Recent Labs    12/03/20 0448 12/04/20 0404  NA 135 136  K 4.0 3.8  CL 105 104  CO2 23 23  GLUCOSE 193* 204*  BUN 19 22  CREATININE 1.07 1.10  CALCIUM 8.7* 8.5*     Studies/Results: DG Chest 2 View  Result Date: 12/02/2020 CLINICAL DATA:  Suspected sepsis EXAM: CHEST - 2 VIEW COMPARISON:  08/31/2017 FINDINGS: Heart and mediastinal contours are within normal limits. No focal opacities or effusions. No acute bony abnormality. IMPRESSION: No active cardiopulmonary disease. Electronically Signed   By: Charlett Nose M.D.   On: 12/02/2020 18:12   CT ABDOMEN PELVIS W CONTRAST  Result Date: 12/02/2020 CLINICAL DATA:  History of scrotal abscess, now with increased swelling and drainage. EXAM: CT ABDOMEN AND PELVIS WITH CONTRAST  TECHNIQUE: Multidetector CT imaging of the abdomen and pelvis was performed using the standard protocol following bolus administration of intravenous contrast. CONTRAST:  OMNIPAQUE IOHEXOL 300 MG/ML  SOLN COMPARISON:  CT May 20, 2020 FINDINGS: Lower chest: Bibasilar atelectasis versus scarring. Hepatobiliary: No suspicious hepatic lesion. Cholelithiasis without findings of acute cholecystitis. No biliary ductal dilation. Pancreas: No pancreatic ductal dilation or evidence of acute inflammation. Spleen: Within normal limits. Adrenals/Urinary Tract: Adrenal glands are unremarkable. Kidneys are normal, without renal calculi, solid enhancing lesion, or hydronephrosis. Bladder is unremarkable for degree of distension. Stomach/Bowel: Small hiatal hernia otherwise the stomach is unremarkable for degree of distension. No pathologic dilation of small or large bowel. The appendix and terminal ileum appear normal. No evidence of acute bowel inflammation. Vascular/Lymphatic: Aortic and branch vessel atherosclerosis without aneurysmal dilation. No pathologically enlarged abdominal or pelvic lymph nodes. Reproductive: There is a rim enhancing gas and fluid collection in the scrotum posterosuperior to the testicle in posterior to the penile shaft common same location as the previously drained scrotal abscess now measuring 5.8 x 3.7 cm on image 111/8 previously 5.6 x 6.9 cm. There is a new thick walled fluid collection posterior to this along the left lateral aspect of the scrotum which measures 4.4 x 1.4 cm on image 116/8, this appears to track to the cutaneous skin surface along the left posterior hemiscrotum on image 121/8. Bilateral hydroceles. Prostate glands unremarkable. Other: No abdominopelvic free fluid. Musculoskeletal: Multilevel degenerative changes spine. No acute osseous abnormality. IMPRESSION: 1. Scrotal abscess similar  location but decreased in size in comparison to the previously drained scrotal abscess  now measuring 5.8 x 3.7 cm. 2. New thick walled fluid collection posterior to this along the left lateral aspect of the scrotum which measures 4.4 x 1.4 cm, this appears to track to the cutaneous skin surface along the left posterior hemiscrotum. Findings also likely reflect a scrotal abscess. 3. Bilateral hydroceles. 4. Cholelithiasis without acute cholecystitis. 5. Aortic Atherosclerosis (ICD10-I70.0). Electronically Signed   By: Maudry Mayhew M.D.   On: 12/02/2020 18:27    Assessment/Plan:  70 y.o. male s/p scrotal I&D.  Overall doing well post-op.   - Hemostatic sutures removed today and packing changed by nursing staff. Family educated on packing and encourage to perform at least daily at home.  - Follow up cultures and tailor antibiotics as appropriate - Expect discharge on oral course of antibiotics w/ daily scrotal dressing changes and outpatient Urology follow up    LOS: 2 days   Timothy Mosher, MD [facility] Urology Resident, [facility]

## 2020-12-05 LAB — CBC WITH DIFFERENTIAL/PLATELET
Abs Immature Granulocytes: 0.14 10*3/uL — ABNORMAL HIGH (ref 0.00–0.07)
Basophils Absolute: 0 10*3/uL (ref 0.0–0.1)
Basophils Relative: 0 %
Eosinophils Absolute: 0.2 10*3/uL (ref 0.0–0.5)
Eosinophils Relative: 2 %
HCT: 44.2 % (ref 39.0–52.0)
Hemoglobin: 14 g/dL (ref 13.0–17.0)
Immature Granulocytes: 1 %
Lymphocytes Relative: 23 %
Lymphs Abs: 2.6 10*3/uL (ref 0.7–4.0)
MCH: 29.9 pg (ref 26.0–34.0)
MCHC: 31.7 g/dL (ref 30.0–36.0)
MCV: 94.2 fL (ref 80.0–100.0)
Monocytes Absolute: 0.9 10*3/uL (ref 0.1–1.0)
Monocytes Relative: 8 %
Neutro Abs: 7.6 10*3/uL (ref 1.7–7.7)
Neutrophils Relative %: 66 %
Platelets: 167 10*3/uL (ref 150–400)
RBC: 4.69 MIL/uL (ref 4.22–5.81)
RDW: 14 % (ref 11.5–15.5)
WBC: 11.5 10*3/uL — ABNORMAL HIGH (ref 4.0–10.5)
nRBC: 0 % (ref 0.0–0.2)

## 2020-12-05 LAB — BASIC METABOLIC PANEL
Anion gap: 8 (ref 5–15)
BUN: 19 mg/dL (ref 8–23)
CO2: 25 mmol/L (ref 22–32)
Calcium: 8.6 mg/dL — ABNORMAL LOW (ref 8.9–10.3)
Chloride: 106 mmol/L (ref 98–111)
Creatinine, Ser: 0.94 mg/dL (ref 0.61–1.24)
GFR, Estimated: >60 mL/min (ref >60)
Glucose, Bld: 119 mg/dL — ABNORMAL HIGH (ref 70–99)
Potassium: 3.9 mmol/L (ref 3.5–5.1)
Sodium: 139 mmol/L (ref 135–145)

## 2020-12-05 LAB — GLUCOSE, CAPILLARY
Glucose-Capillary: 140 mg/dL — ABNORMAL HIGH (ref 70–99)
Glucose-Capillary: 118 mg/dL — ABNORMAL HIGH (ref 70–99)
Glucose-Capillary: 128 mg/dL — ABNORMAL HIGH (ref 70–99)
Glucose-Capillary: 145 mg/dL — ABNORMAL HIGH (ref 70–99)

## 2020-12-05 LAB — PROTIME-INR
INR: 2.4 — ABNORMAL HIGH (ref 0.8–1.2)
Prothrombin Time: 26 seconds — ABNORMAL HIGH (ref 11.4–15.2)

## 2020-12-05 MED ORDER — WARFARIN - PHARMACIST DOSING INPATIENT: Freq: Every day | Status: DC

## 2020-12-05 MED ORDER — WARFARIN SODIUM 5 MG PO TABS
5.0000 mg | ORAL_TABLET | Freq: Once | ORAL | Status: AC
  Administered 2020-12-05: 5 mg via ORAL
  Filled 2020-12-05: qty 1

## 2020-12-05 NOTE — Plan of Care (Signed)
  Problem: Education: Goal: Knowledge of General Education information will improve Description: Including pain rating scale, medication(s)/side effects and non-pharmacologic comfort measures Outcome: Progressing   Problem: Health Behavior/Discharge Planning: Goal: Ability to manage health-related needs will improve Outcome: Progressing   Problem: Activity: Goal: Risk for activity intolerance will decrease Outcome: Progressing   Problem: Nutrition: Goal: Adequate nutrition will be maintained Outcome: Progressing   Problem: Pain Managment: Goal: General experience of comfort will improve Outcome: Progressing   Problem: Skin Integrity: Goal: Risk for impaired skin integrity will decrease Outcome: Progressing   

## 2020-12-05 NOTE — Progress Notes (Signed)
ANTICOAGULATION CONSULT NOTE - Follow Up Consult  Pharmacy Consult for Warfarin Indication: atrial fibrillation  No Known Allergies  Patient Measurements: Height: 6\' 2"  (188 cm) Weight: 121.4 kg (267 lb 10.2 oz) IBW/kg (Calculated) : 82.2  Vital Signs: Temp: 97.7 F (36.5 C) (10/19 0555) Temp Source: Oral (10/19 0555) BP: 144/93 (10/19 0555) Pulse Rate: 88 (10/19 0555)  Labs: Recent Labs    12/03/20 0448 12/04/20 0404 12/05/20 0431  HGB 15.5 13.6 14.0  HCT 48.1 42.8 44.2  PLT 145* 162 167  LABPROT 30.8* 36.5* 26.0*  INR 3.0* 3.7* 2.4*  CREATININE 1.07 1.10 0.94     Estimated Creatinine Clearance: 101.3 mL/min (by C-G formula based on SCr of 0.94 mg/dL).   Medications:  Scheduled:   carvedilol  25 mg Oral BID WC   Chlorhexidine Gluconate Cloth  6 each Topical Daily   insulin aspart  0-9 Units Subcutaneous TID WC   insulin glargine-yfgn  10 Units Subcutaneous QHS   mouth rinse  15 mL Mouth Rinse BID   mupirocin ointment  1 application Nasal BID   senna-docusate  1 tablet Oral BID   Infusions:   ceFEPime (MAXIPIME) IV 2 g (12/05/20 0700)   vancomycin 1,000 mg (12/05/20 12/07/20)    Assessment: 70 yoM admitted for scrotal abscess, s/p I&D.  PMH is significant for Afib on chronic warfarin anticoagulation.  Pharmacy is consulted to resume warfarin when cleared by urology.  OK to resume warfarin on 10/19 per urology.  PTA Warfarin 5mg  daily except 2.5mg  on Mondays.  Last dose on 10/16 AM.   Admit INR 3.  Today, 12/05/2020: INR decreased to therapeutic, 2.4 Warfarin held on 10/17-10/18 CBC: Hgb and Plt WNL No bleeding or complications reported.  Urology removed sutures and packing changed by nursing staff on POD1, 10/18.  Expect daily packing/dressing changes.   Drug-drug interaction: expect INR increase due to DDI with metronidazole (10/16-10/18).  Other antibiotics may increase INR.  Follow up plans for outpatient regimen.    Goal of Therapy:  INR 2-3 Monitor  platelets by anticoagulation protocol: Yes   Plan:  Warfarin 5mg  PO today at 16:00 Daily PT-INR Monitor for drug-drug interactions with outpatient antibiotics. Monitor for any s/s bleeding or complications.    11/18 PharmD, BCPS Clinical Pharmacist [facility] pharmacy [phone] 12/05/2020 7:31 AM

## 2020-12-05 NOTE — Progress Notes (Addendum)
PROGRESS NOTE    Timothy Cantu  CZY:[id number] DOB: [birth year] DOA: 12/02/2020 PCP: [name], MD    Brief Narrative:  [name] was admitted to the hospital with the working diagnosis of recurrent scrotal abscess.   66 yp male with the past medical history of T2DM, atrial fibrillation, heart failure and hypertension who presented with worsening painful scrotal edema. He had a scrotal abscess in 05/2020. On his initial physical examination he was afebrile, blood pressure 150/95, HR 113, rr 25 and oxygen saturation 96%. His lungs were clear to auscultation, heart S1 and S2 present and rhythmic, abdomen soft and non tender, no lower extremity edema, his scrotum was edematous and erythematous.   Wbc 16,38m Hgb 15,6 hct 47,3 and Plt 190.   Patient was placed on antibiotic therapy and taking emergently to the OR for incision and drainage (12/03/20).   Assessment & Plan:   Principal Problem:   Scrotal abscess Active Problems:   Congestive dilated cardiomyopathy (HCC)   ATRIAL FIBRILLATION   Controlled type 2 diabetes mellitus with hyperglycemia (HCC)   Scrotal abscess. Sp I&D on 12/03/20.  Patient is feeling better, no significant pain, no nausea or vomiting. Wbc is 11,5 and he has been afebrile.  Plan to continue IV antibiotic therapy today and transition to oral antibiotic therapy in am, if continue to improve wbc and patient afebrile.  On cefepime and vancomycin   2. CHF/ HTN. Patient with no signs of exacerbation, continue blood pressure monitoring Continue with carvedilol.  Holding losartan and hctz.  3. Atrial fibrillation. Rate controlled and anticoagulation with warfarin, INR is 2,4   4. T2DM. Fasting glucose this am 119, capillary 140 and 118 Continue with insulin sliding scale for glucose cover and monitoring.   Basal insulin with 10 units  5. Obesity class 1 calculated BMI is 34.3  Status is: Inpatient  Remains inpatient appropriate because: IV  antibiotic therapy   DVT prophylaxis: Scd   Code Status:    full  Family Communication:   I spoke with patient's son and wife at the bedside, we talked in detail about patient's condition, plan of care and prognosis and all questions were addressed.      Consultants:  Urology   Procedures:  I&D  Antimicrobials:   Cefepime and vancomycin    Subjective: Patient with no nausea or vomiting, no chest pain or dyspnea. No significant scrotal edema,   Objective: Vitals:   12/04/20 0608 12/04/20 1248 12/04/20 2052 12/05/20 0555  BP: 128/82 127/79 (!) 142/91 (!) 144/93  Pulse: 99 98 96 88  Resp: 18 16 18 18   Temp: 97.8 F (36.6 C) (!) 97.5 F (36.4 C) 97.8 F (36.6 C) 97.7 F (36.5 C)  TempSrc:  Oral Oral Oral  SpO2: 96% 97% 98% 96%  Weight:      Height:        Intake/Output Summary (Last 24 hours) at 12/05/2020 1512 Last data filed at 12/05/2020 0700 Gross per 24 hour  Intake 852.46 ml  Output --  Net 852.46 ml   Filed Weights   12/02/20 1559 12/02/20 2137  Weight: 120.2 kg 121.4 kg    Examination:   General: Not in pain or dyspnea, deconditioned  Neurology: Awake and alert, non focal  E ENT: no pallor, no icterus, oral mucosa moist Cardiovascular: No JVD. S1-S2 present, rhythmic, no gallops, rubs, or murmurs. No lower extremity edema. Pulmonary: positive breath sounds bilaterally, adequate air movement, no wheezing, rhonchi or rales. Gastrointestinal. Abdomen soft and non  tender Skin. Scrotum with dressing in place, no edema or erythema  Musculoskeletal: no joint deformities     Data Reviewed: I have personally reviewed following labs and imaging studies  CBC: Recent Labs  Lab 12/02/20 1622 12/03/20 0448 12/04/20 0404 12/05/20 0431  WBC 16.3* 14.0* 19.1* 11.5*  NEUTROABS 12.0* 12.3* 16.2* 7.6  HGB 15.6 15.5 13.6 14.0  HCT 47.3 48.1 42.8 44.2  MCV 91.8 93.4 93.4 94.2  PLT 190 145* 162 167   Basic Metabolic Panel: Recent Labs  Lab  12/02/20 1622 12/03/20 0448 12/04/20 0404 12/05/20 0431  NA 135 135 136 139  K 3.8 4.0 3.8 3.9  CL 103 105 104 106  CO2 22 23 23 25   GLUCOSE 163* 193* 204* 119*  BUN 29* 19 22 19   CREATININE 1.43* 1.07 1.10 0.94  CALCIUM 8.9 8.7* 8.5* 8.6*   GFR: Estimated Creatinine Clearance: 101.3 mL/min (by C-G formula based on SCr of 0.94 mg/dL). Liver Function Tests: Recent Labs  Lab 12/02/20 1622 12/03/20 0448  AST 29 25  ALT 35 33  ALKPHOS 72 60  BILITOT 0.9 1.3*  PROT 7.5 7.1  ALBUMIN 4.2 4.1   No results for input(s): LIPASE, AMYLASE in the last 168 hours. No results for input(s): AMMONIA in the last 168 hours. Coagulation Profile: Recent Labs  Lab 12/02/20 1622 12/03/20 0448 12/04/20 0404 12/05/20 0431  INR 3.0* 3.0* 3.7* 2.4*   Cardiac Enzymes: No results for input(s): CKTOTAL, CKMB, CKMBINDEX, TROPONINI in the last 168 hours. BNP (last 3 results) Recent Labs    05/17/20 0911  PROBNP 465*   HbA1C: Recent Labs    12/03/20 0448  HGBA1C 6.2*   CBG: Recent Labs  Lab 12/04/20 1127 12/04/20 1705 12/04/20 2052 12/05/20 0730 12/05/20 1134  GLUCAP 174* 110* 173* 140* 118*   Lipid Profile: No results for input(s): CHOL, HDL, LDLCALC, TRIG, CHOLHDL, LDLDIRECT in the last 72 hours. Thyroid Function Tests: No results for input(s): TSH, T4TOTAL, FREET4, T3FREE, THYROIDAB in the last 72 hours. Anemia Panel: No results for input(s): VITAMINB12, FOLATE, FERRITIN, TIBC, IRON, RETICCTPCT in the last 72 hours.    Radiology Studies: I have reviewed all of the imaging during this hospital visit personally     Scheduled Meds:  carvedilol  25 mg Oral BID WC   Chlorhexidine Gluconate Cloth  6 each Topical Daily   insulin aspart  0-9 Units Subcutaneous TID WC   insulin glargine-yfgn  10 Units Subcutaneous QHS   mouth rinse  15 mL Mouth Rinse BID   mupirocin ointment  1 application Nasal BID   senna-docusate  1 tablet Oral BID   warfarin  5 mg Oral ONCE-1600    Warfarin - Pharmacist Dosing Inpatient   Does not apply q1600   Continuous Infusions:  ceFEPime (MAXIPIME) IV 2 g (12/05/20 1428)   vancomycin 1,000 mg (12/05/20 12/07/20)     LOS: 3 days        Timothy Cantu 12/07/20, MD

## 2020-12-05 NOTE — Progress Notes (Signed)
Patient's wife educated on wound care instructions. Advised to perform daily. Patient's wife and patient verbalized understanding and feel comfortable doing the wound care/dressing changes at home when patient is able to be discharged.

## 2020-12-06 ENCOUNTER — Telehealth: Payer: Self-pay | Admitting: *Deleted

## 2020-12-06 DIAGNOSIS — I4891 Unspecified atrial fibrillation: Secondary | ICD-10-CM

## 2020-12-06 LAB — CBC
HCT: 45 % (ref 39.0–52.0)
Hemoglobin: 14.4 g/dL (ref 13.0–17.0)
MCH: 29.8 pg (ref 26.0–34.0)
MCHC: 32 g/dL (ref 30.0–36.0)
MCV: 93.2 fL (ref 80.0–100.0)
Platelets: 173 10*3/uL (ref 150–400)
RBC: 4.83 MIL/uL (ref 4.22–5.81)
RDW: 13.8 % (ref 11.5–15.5)
WBC: 10.6 10*3/uL — ABNORMAL HIGH (ref 4.0–10.5)
nRBC: 0 % (ref 0.0–0.2)

## 2020-12-06 LAB — PROTIME-INR
INR: 1.7 — ABNORMAL HIGH (ref 0.8–1.2)
Prothrombin Time: 20.3 seconds — ABNORMAL HIGH (ref 11.4–15.2)

## 2020-12-06 LAB — GLUCOSE, CAPILLARY: Glucose-Capillary: 164 mg/dL — ABNORMAL HIGH (ref 70–99)

## 2020-12-06 MED ORDER — WARFARIN SODIUM 5 MG PO TABS: 2.5000 mg | ORAL_TABLET | ORAL | 0 refills | Status: DC

## 2020-12-06 MED ORDER — OXYCODONE HCL 5 MG PO TABS: 5.0000 mg | ORAL_TABLET | Freq: Four times a day (QID) | ORAL | 0 refills | Status: DC | PRN

## 2020-12-06 MED ORDER — CEPHALEXIN 500 MG PO CAPS: 500.0000 mg | ORAL_CAPSULE | Freq: Four times a day (QID) | ORAL | 0 refills | Status: AC

## 2020-12-06 MED ORDER — WARFARIN SODIUM 5 MG PO TABS: ORAL_TABLET | ORAL | 0 refills | Status: DC

## 2020-12-06 MED ORDER — CEPHALEXIN 500 MG PO CAPS
500.0000 mg | ORAL_CAPSULE | Freq: Four times a day (QID) | ORAL | Status: DC
  Administered 2020-12-06: 500 mg via ORAL
  Filled 2020-12-06: qty 1

## 2020-12-06 MED ORDER — WARFARIN SODIUM 5 MG PO TABS
7.5000 mg | ORAL_TABLET | Freq: Once | ORAL | Status: AC
  Administered 2020-12-06: 7.5 mg via ORAL
  Filled 2020-12-06: qty 1

## 2020-12-06 MED ORDER — ACETAMINOPHEN 325 MG PO TABS: 650.0000 mg | ORAL_TABLET | Freq: Four times a day (QID) | ORAL | Status: AC | PRN

## 2020-12-06 NOTE — Progress Notes (Signed)
ANTICOAGULATION CONSULT NOTE - Follow Up Consult  Pharmacy Consult for Warfarin Indication: atrial fibrillation  No Known Allergies  Patient Measurements: Height: 6\' 2"  (188 cm) Weight: 121.4 kg (267 lb 10.2 oz) IBW/kg (Calculated) : 82.2  Vital Signs: Temp: 98.1 F (36.7 C) (10/20 0451) BP: 146/95 (10/20 0451) Pulse Rate: 92 (10/20 0451)  Labs: Recent Labs    12/04/20 0404 12/05/20 0431 12/06/20 0444  HGB 13.6 14.0 14.4  HCT 42.8 44.2 45.0  PLT 162 167 173  LABPROT 36.5* 26.0* 20.3*  INR 3.7* 2.4* 1.7*  CREATININE 1.10 0.94  --      Estimated Creatinine Clearance: 101.3 mL/min (by C-G formula based on SCr of 0.94 mg/dL).   Medications:  Scheduled:   carvedilol  25 mg Oral BID WC   Chlorhexidine Gluconate Cloth  6 each Topical Daily   insulin aspart  0-9 Units Subcutaneous TID WC   insulin glargine-yfgn  10 Units Subcutaneous QHS   mouth rinse  15 mL Mouth Rinse BID   mupirocin ointment  1 application Nasal BID   senna-docusate  1 tablet Oral BID   Warfarin - Pharmacist Dosing Inpatient   Does not apply q1600   Infusions:   ceFEPime (MAXIPIME) IV 2 g (12/06/20 0655)   vancomycin 1,000 mg (12/06/20 0535)    Assessment: 70 yoM admitted for scrotal abscess, s/p I&D.  PMH is significant for Afib on chronic warfarin anticoagulation.  Pharmacy is consulted to resume warfarin when cleared by urology.  OK to resume warfarin on 10/19 per urology.  PTA Warfarin 5mg  daily except 2.5mg  on Mondays.  Last dose on 10/16 AM.   Admit INR 3.  Today, 12/06/2020: INR decreased to sub-therapeutic, 1.7 Warfarin held on 10/17-10/18, resumed 10/19 CBC: Hgb and Plt WNL No bleeding or complications reported.  Urology removed sutures and packing changed by nursing staff on POD1, 10/18.  Expect daily packing/dressing changes.   Drug-drug interaction: expect INR increase due to DDI with metronidazole (10/16-10/18).  Now, narrowed to Keflex alone.    Goal of Therapy:  INR  2-3 Monitor platelets by anticoagulation protocol: Yes   Plan:  Warfarin 7.5mg  PO today at 16:00 - boosted dose x1 today  Daily PT-INR Monitor for drug-drug interactions with outpatient antibiotics. Monitor for any s/s bleeding or complications.    For discharge:  recommend resuming home warfarin dosing 5mg  daily except 2.5 mg on Mondays with next INR check on Monday 12/10/20.    05-16-1973 PharmD, BCPS Clinical Pharmacist [facility] [phone] 12/06/2020 7:53 AM

## 2020-12-06 NOTE — Discharge Summary (Addendum)
Physician Discharge Summary  Alphonsa Brickle QIO:[id number] DOB: 10-14-1950 DOA: 12/02/2020  PCP: [name], MD  Admit date: 12/02/2020 Discharge date: 12/06/2020  Admitted From: Home  Disposition: home   Recommendations for Outpatient Follow-up and new medication changes:  Follow up with Dr Leavy Cella in 7 to 10 days Continue antibiotic therapy with cephalexin for 10 days.  Follow up with Urology  Daily scrotal dressing changes.  Follow up INR this week.   Home Health: no   Equipment/Devices: no    Discharge Condition: stable  CODE STATUS: full  Diet recommendation:  heart healthy   Brief/Interim Summary: Mr. Dumont was admitted to the hospital with the working diagnosis of recurrent scrotal abscess.    70 yp male with the past medical history of T2DM, atrial fibrillation, heart failure and hypertension who presented with worsening painful scrotal edema. He had a scrotal abscess in 05/2020. On his initial physical examination he was afebrile, blood pressure 150/95, HR 113, rr 25 and oxygen saturation 96%. His lungs were clear to auscultation, heart S1 and S2 present and rhythmic, abdomen soft and non tender, no lower extremity edema, his scrotum was edematous and erythematous.    Sodium 135, potassium 3.8, chloride 103, bicarb 22, glucose 163, BUN 29, creatinine 1.43, Wbc 16,27m Hgb 15,6 hct 47,3 and Plt 190.  SARS COVID-19 negative  Urinalysis specific gravity 1.010, 0-5 white cells.  CT of the abdomen/pelvis with scrotal abscess similar location but decreased in size in comparison to prior study, measuring 5.8 x 3.7 cm.  New thick-walled fluid collection posterior to this along the left lateral aspect of scrotum which measures 4.4 x 1.4 cm.  This appears to track to the cutaneous skin surface along the left posterior hemiscrotum.  Bilateral hydroceles.  EKG 112 bpm, normal axis, normal QTC, atrial fibrillation rhythm, no significant ST segment or T wave changes.  Chest  radiograph with mild cardiomegaly, hilar vascular congestion bilaterally.  Patient was placed on antibiotic therapy and taking emergently to the OR for incision and drainage (12/03/20).   Patient clinically improving, plan to continue oral antibiotic therapy for 10 more days.  Recurrent scleral abscess, complicated with sepsis, present on admission. Patient was admitted to the medical ward, he received broad-spectrum intravenous antibiotic therapy along with intravenous fluids. Underwent urgent incision and drainage by urology. His blood cultures remain no growth, abscess culture grew E. coli which was sensitive to cephalosporins.  Patient will continue antibiotic therapy with oral cephalexin for 10 more days.  Continue daily wound dressing changes per urology recommendations. Pain control with acetaminophen.  2.  Acute kidney injury.  Patient received supportive medical therapy including intravenous fluids. His kidney function improved, at discharge sodium 139, potassium 3.9, chloride 106, bicarb 25, glucose 119, BUN 19, creatinine 0.94.  3.  Chronic diastolic heart failure/hypertension.  No signs of acute heart failure exacerbation. Patient was continued on carvedilol, at discharge he will resume losartan and hydrochlorothiazide.  4.  Atrial fibrillation.  He remained well rate controlled Continue rate control with carvedilol, anticoagulation with warfarin. His discharge INR is 1.7, follow-up as an outpatient.  Target 2-3.  5.  Type 2 diabetes mellitus.  His glucose remained well controlled during his hospitalization, he was placed on insulin sliding scale and basal. At home resume metformin.   6.  Obesity class I.  His calculated BMI is 34.3.  Discharge Diagnoses:  Principal Problem:   Scrotal abscess Active Problems:   Congestive dilated cardiomyopathy (HCC)   ATRIAL FIBRILLATION  Controlled type 2 diabetes mellitus with hyperglycemia [facility])    Discharge  Instructions   Allergies as of 12/06/2020   No Known Allergies      Medication List     STOP taking these medications    clotrimazole-betamethasone lotion Commonly known as: LOTRISONE       TAKE these medications    acetaminophen 325 MG tablet Commonly known as: TYLENOL Take 2 tablets (650 mg total) by mouth every 6 (six) hours as needed for mild pain or moderate pain (or Fever >/= 101).   carvedilol 25 MG tablet Commonly known as: COREG Take 1 tablet (25 mg total) by mouth 2 (two) times daily with a meal. PLEASE CONTACT OFFICE FOR ADDITIONAL REFILLS. 1ST ATTEMPT What changed: additional instructions   cephALEXin 500 MG capsule Commonly known as: KEFLEX Take 1 capsule (500 mg total) by mouth every 6 (six) hours for 10 days.   hydrochlorothiazide 12.5 MG tablet Commonly known as: HYDRODIURIL Take 1 tablet (12.5 mg total) by mouth daily. What changed: when to take this   losartan 50 MG tablet Commonly known as: COZAAR Take 1 tablet (50 mg total) by mouth daily. What changed: when to take this   metFORMIN 500 MG tablet Commonly known as: GLUCOPHAGE Take 500 mg by mouth at bedtime.   oxyCODONE 5 MG immediate release tablet Commonly known as: Oxy IR/ROXICODONE Take 1 tablet (5 mg total) by mouth every 6 (six) hours as needed for severe pain.   warfarin 5 MG tablet Commonly known as: COUMADIN Take as directed. If you are unsure how to take this medication, talk to your nurse or doctor. Original instructions: Take 0.5-1 tablets (2.5-5 mg total) by mouth See admin instructions. Take 5 mg by mouth in the morning on Sun/Tues/Wed/Thurs/Fri/Sat and 2.5 mg on Mondays               Discharge Care Instructions  (From admission, onward)           Start     Ordered   12/06/20 0000  Discharge wound care:       Comments: Wet-to-dry dressings changes to the two scrotal wounds.  Packing gauze strips for the wounds covered with dry Kerlex.  Change dressing daily.    12/06/20 1028            No Known Allergies  Consultations: Urology    Procedures/Studies: DG Chest 2 View  Result Date: 12/02/2020 CLINICAL DATA:  Suspected sepsis EXAM: CHEST - 2 VIEW COMPARISON:  08/31/2017 FINDINGS: Heart and mediastinal contours are within normal limits. No focal opacities or effusions. No acute bony abnormality. IMPRESSION: No active cardiopulmonary disease. Electronically Signed   By: Charlett Nose M.D.   On: 12/02/2020 18:12   CT ABDOMEN PELVIS W CONTRAST  Result Date: 12/02/2020 CLINICAL DATA:  History of scrotal abscess, now with increased swelling and drainage. EXAM: CT ABDOMEN AND PELVIS WITH CONTRAST TECHNIQUE: Multidetector CT imaging of the abdomen and pelvis was performed using the standard protocol following bolus administration of intravenous contrast. CONTRAST:  OMNIPAQUE IOHEXOL 300 MG/ML  SOLN COMPARISON:  CT May 20, 2020 FINDINGS: Lower chest: Bibasilar atelectasis versus scarring. Hepatobiliary: No suspicious hepatic lesion. Cholelithiasis without findings of acute cholecystitis. No biliary ductal dilation. Pancreas: No pancreatic ductal dilation or evidence of acute inflammation. Spleen: Within normal limits. Adrenals/Urinary Tract: Adrenal glands are unremarkable. Kidneys are normal, without renal calculi, solid enhancing lesion, or hydronephrosis. Bladder is unremarkable for degree of distension. Stomach/Bowel: Small hiatal hernia otherwise the  stomach is unremarkable for degree of distension. No pathologic dilation of small or large bowel. The appendix and terminal ileum appear normal. No evidence of acute bowel inflammation. Vascular/Lymphatic: Aortic and branch vessel atherosclerosis without aneurysmal dilation. No pathologically enlarged abdominal or pelvic lymph nodes. Reproductive: There is a rim enhancing gas and fluid collection in the scrotum posterosuperior to the testicle in posterior to the penile shaft common same location as the  previously drained scrotal abscess now measuring 5.8 x 3.7 cm on image 111/8 previously 5.6 x 6.9 cm. There is a new thick walled fluid collection posterior to this along the left lateral aspect of the scrotum which measures 4.4 x 1.4 cm on image 116/8, this appears to track to the cutaneous skin surface along the left posterior hemiscrotum on image 121/8. Bilateral hydroceles. Prostate glands unremarkable. Other: No abdominopelvic free fluid. Musculoskeletal: Multilevel degenerative changes spine. No acute osseous abnormality. IMPRESSION: 1. Scrotal abscess similar location but decreased in size in comparison to the previously drained scrotal abscess now measuring 5.8 x 3.7 cm. 2. New thick walled fluid collection posterior to this along the left lateral aspect of the scrotum which measures 4.4 x 1.4 cm, this appears to track to the cutaneous skin surface along the left posterior hemiscrotum. Findings also likely reflect a scrotal abscess. 3. Bilateral hydroceles. 4. Cholelithiasis without acute cholecystitis. 5. Aortic Atherosclerosis (ICD10-I70.0). Electronically Signed   By: Maudry Mayhew M.D.   On: 12/02/2020 18:27     Procedures: scrotal abscess I&D  Subjective: Patient is feeling better, no significant scrotal pain, no nausea or vomiting,   Discharge Exam: Vitals:   12/05/20 2116 12/06/20 0451  BP: 127/84 (!) 146/95  Pulse: 96 92  Resp: 20 18  Temp: 98 F (36.7 C) 98.1 F (36.7 C)  SpO2: 93% 95%   Vitals:   12/05/20 0555 12/05/20 1632 12/05/20 2116 12/06/20 0451  BP: (!) 144/93 (!) 159/76 127/84 (!) 146/95  Pulse: 88 81 96 92  Resp: 18 19 20 18   Temp: 97.7 F (36.5 C) 98.1 F (36.7 C) 98 F (36.7 C) 98.1 F (36.7 C)  TempSrc: Oral Oral    SpO2: 96% 98% 93% 95%  Weight:      Height:        General: Not in pain or dyspnea Neurology: Awake and alert, non focal  E ENT: no pallor, no icterus, oral mucosa moist Cardiovascular: No JVD. S1-S2 present, rhythmic, no gallops, rubs,  or murmurs. No lower extremity edema. Pulmonary: positive breath sounds bilaterally, adequate air movement, no wheezing, rhonchi or rales. Gastrointestinal. Abdomen soft and non tender Skin. Scrotal wound with dressing in place.  Musculoskeletal: no joint deformities   The results of significant diagnostics from this hospitalization (including imaging, microbiology, ancillary and laboratory) are listed below for reference.     Microbiology: Recent Results (from the past 240 hour(s))  Culture, blood (Routine x 2)     Status: None (Preliminary result)   Collection Time: 12/02/20  4:22 PM   Specimen: BLOOD  Result Value Ref Range Status   Specimen Description   Final    BLOOD RIGHT ANTECUBITAL Performed at [facility], [address] [place], [place] [address]    Special Requests   Final    BOTTLES DRAWN AEROBIC AND ANAEROBIC Blood Culture adequate volume Performed at [facility], [address] [place], [place] [address]    Culture   Final    NO GROWTH 3 DAYS Performed at  [facility], [address] [place], [place] [address]    Report Status PENDING  Incomplete  Culture, blood (Routine x 2)     Status: None (Preliminary result)   Collection Time: 12/02/20  5:00 PM   Specimen: BLOOD RIGHT HAND  Result Value Ref Range Status   Specimen Description   Final    BLOOD RIGHT HAND BLOOD Performed at [facility], [address] [place], [place] [address]    Special Requests   Final    Blood Culture adequate volume BOTTLES DRAWN AEROBIC AND ANAEROBIC Performed at [facility], [address] [place], [place] [address]    Culture   Final    NO GROWTH 3 DAYS Performed at [facility], [address], [place], [place] [address]    Report Status PENDING  Incomplete  Resp Panel by RT-PCR (Flu A&B, Covid) Nasopharyngeal Swab     Status: None   Collection Time: 12/02/20  7:17 PM   Specimen: Nasopharyngeal Swab;  Nasopharyngeal(NP) swabs in vial transport medium  Result Value Ref Range Status   SARS Coronavirus 2 by RT PCR NEGATIVE NEGATIVE Final    Comment: (NOTE) SARS-CoV-2 target nucleic acids are NOT DETECTED.  The SARS-CoV-2 RNA is generally detectable in upper respiratory specimens during the acute phase of infection. The lowest concentration of SARS-CoV-2 viral copies this assay can detect is 138 copies/mL. A negative result does not preclude SARS-Cov-2 infection and should not be used as the sole basis for treatment or other patient management decisions. A negative result may occur with  improper specimen collection/handling, submission of specimen other than nasopharyngeal swab, presence of viral mutation(s) within the areas targeted by this assay, and inadequate number of viral copies(<138 copies/mL). A negative result must be combined with clinical observations, patient history, and epidemiological information. The expected result is Negative.  Fact Sheet for Patients:  BloggerCourse.com  Fact Sheet for Healthcare Providers:  SeriousBroker.it  This test is no t yet approved or cleared by the [place] FDA and  has been authorized for detection and/or diagnosis of SARS-CoV-2 by FDA under an Emergency Use Authorization (EUA). This EUA will remain  in effect (meaning this test can be used) for the duration of the COVID-19 declaration under Section 564(b)(1) of the Act, 21 U.S.C.section 360bbb-3(b)(1), unless the authorization is terminated  or revoked sooner.       Influenza A by PCR NEGATIVE NEGATIVE Final   Influenza B by PCR NEGATIVE NEGATIVE Final    Comment: (NOTE) The Xpert Xpress SARS-CoV-2/FLU/RSV plus assay is intended as an aid in the diagnosis of influenza from Nasopharyngeal swab specimens and should not be used as a sole basis for treatment. Nasal washings and aspirates are unacceptable for Xpert Xpress  SARS-CoV-2/FLU/RSV testing.  Fact Sheet for Patients: BloggerCourse.com  Fact Sheet for Healthcare Providers: SeriousBroker.it  This test is not yet approved or cleared by the [place] FDA and has been authorized for detection and/or diagnosis of SARS-CoV-2 by FDA under an Emergency Use Authorization (EUA). This EUA will remain in effect (meaning this test can be used) for the duration of the COVID-19 declaration under Section 564(b)(1) of the Act, 21 U.S.C. section 360bbb-3(b)(1), unless the authorization is terminated or revoked.  Performed at [facility], [address] [place], [place] [address]   Surgical PCR screen     Status: Abnormal   Collection Time: 12/02/20 10:39 PM   Specimen: Nasal Mucosa;  Nasal Swab  Result Value Ref Range Status   MRSA, PCR NEGATIVE NEGATIVE Final   Staphylococcus aureus POSITIVE (A) NEGATIVE Final    Comment: RESULT CALLED TO, READ BACK BY AND VERIFIED WITH: EVANS,H. 12/03/20 @02 :27 SEEL,M. (NOTE) The Xpert SA Assay (FDA approved for NASAL specimens in patients 39 years of age and older), is one component of a comprehensive surveillance program. It is not intended to diagnose infection nor to guide or monitor treatment. Performed at [facility], [address], [place], [place]   Aerobic/Anaerobic Culture w Gram Stain (surgical/deep wound)     Status: Abnormal (Preliminary result)   Collection Time: 12/03/20 12:29 AM   Specimen: Abscess  Result Value Ref Range Status   Specimen Description   Final    ABSCESS SCROTAL Performed at [facility], [address] [place], [place]    Special Requests   Final    NONE Performed at [facility], [address] [place], [place]    Gram Stain   Final    ABUNDANT WBC PRESENT, PREDOMINANTLY PMN RARE GRAM NEGATIVE RODS Performed at [facility], [address] [place], [place]    Culture (A)  Final    ESCHERICHIA COLI NO ANAEROBES ISOLATED; CULTURE IN PROGRESS FOR 5 DAYS    Report Status PENDING  Incomplete   Organism ID, Bacteria ESCHERICHIA COLI  Final      Susceptibility   Escherichia coli - MIC*    AMPICILLIN >=32 RESISTANT Resistant     CEFAZOLIN <=4 SENSITIVE Sensitive     CEFEPIME <=0.12 SENSITIVE Sensitive     CEFTAZIDIME <=1 SENSITIVE Sensitive     CEFTRIAXONE <=0.25 SENSITIVE Sensitive     CIPROFLOXACIN <=0.25 SENSITIVE Sensitive     GENTAMICIN <=1 SENSITIVE Sensitive     IMIPENEM <=0.25 SENSITIVE Sensitive     TRIMETH/SULFA >=320 RESISTANT Resistant     AMPICILLIN/SULBACTAM 16 INTERMEDIATE Intermediate     PIP/TAZO <=4 SENSITIVE Sensitive     * ESCHERICHIA COLI     Labs: BNP (last 3 results) No results for input(s): BNP in the last 8760 hours. Basic Metabolic Panel: Recent Labs  Lab 12/02/20 1622 12/03/20 0448 12/04/20 0404 12/05/20 0431  NA 135 135 136 139  K 3.8 4.0 3.8 3.9  CL 103 105 104 106  CO2 22 23 23 25   GLUCOSE 163* 193* 204* 119*  BUN 29* 19 22 19   CREATININE 1.43* 1.07 1.10 0.94  CALCIUM 8.9 8.7* 8.5* 8.6*   Liver Function Tests: Recent Labs  Lab 12/02/20 1622 12/03/20 0448  AST 29 25  ALT 35 33  ALKPHOS 72 60  BILITOT 0.9 1.3*  PROT 7.5 7.1  ALBUMIN 4.2 4.1   No results for input(s): LIPASE, AMYLASE in the last 168 hours. No results for input(s): AMMONIA in the last 168 hours. CBC: Recent Labs  Lab 12/02/20 1622 12/03/20 0448 12/04/20 0404 12/05/20 0431 12/06/20 0444  WBC 16.3* 14.0* 19.1* 11.5* 10.6*  NEUTROABS 12.0* 12.3* 16.2* 7.6  --   HGB 15.6 15.5 13.6 14.0 14.4  HCT 47.3 48.1 42.8 44.2 45.0  MCV 91.8 93.4 93.4 94.2 93.2  PLT 190 145* 162 167 173   Cardiac Enzymes: No results for input(s): CKTOTAL, CKMB, CKMBINDEX, TROPONINI in the last 168 hours. BNP: Invalid input(s): POCBNP CBG: Recent Labs  Lab 12/05/20 0730 12/05/20 1134  12/05/20 1626 12/05/20 2115 12/06/20 0756  GLUCAP 140* 118* 128* 145* 164*  D-Dimer No results for input(s): DDIMER in the last 72 hours. Hgb A1c No results for input(s): HGBA1C in the last 72 hours. Lipid Profile No results for input(s): CHOL, HDL, LDLCALC, TRIG, CHOLHDL, LDLDIRECT in the last 72 hours. Thyroid function studies No results for input(s): TSH, T4TOTAL, T3FREE, THYROIDAB in the last 72 hours.  Invalid input(s): FREET3 Anemia work up No results for input(s): VITAMINB12, FOLATE, FERRITIN, TIBC, IRON, RETICCTPCT in the last 72 hours. Urinalysis    Component Value Date/Time   COLORURINE YELLOW 12/02/2020 1913   APPEARANCEUR CLEAR 12/02/2020 1913   LABSPEC 1.010 12/02/2020 1913   PHURINE 5.0 12/02/2020 1913   GLUCOSEU NEGATIVE 12/02/2020 1913   HGBUR SMALL (A) 12/02/2020 1913   BILIRUBINUR NEGATIVE 12/02/2020 1913   KETONESUR NEGATIVE 12/02/2020 1913   PROTEINUR NEGATIVE 12/02/2020 1913   NITRITE NEGATIVE 12/02/2020 1913   LEUKOCYTESUR NEGATIVE 12/02/2020 1913   Sepsis Labs Invalid input(s): PROCALCITONIN,  WBC,  LACTICIDVEN Microbiology Recent Results (from the past 240 hour(s))  Culture, blood (Routine x 2)     Status: None (Preliminary result)   Collection Time: 12/02/20  4:22 PM   Specimen: BLOOD  Result Value Ref Range Status   Specimen Description   Final    BLOOD RIGHT ANTECUBITAL Performed at [facility], [address] [place], [place] [address]    Special Requests   Final    BOTTLES DRAWN AEROBIC AND ANAEROBIC Blood Culture adequate volume Performed at [facility], [address] [place], [place] [address]    Culture   Final    NO GROWTH 3 DAYS Performed at [facility], [address], [place], [place] [address]    Report Status PENDING  Incomplete  Culture, blood (Routine x 2)     Status: None (Preliminary result)   Collection Time: 12/02/20  5:00 PM   Specimen: BLOOD RIGHT HAND  Result Value Ref Range  Status   Specimen Description   Final    BLOOD RIGHT HAND BLOOD Performed at [facility], [address] [place], [place] [address]    Special Requests   Final    Blood Culture adequate volume BOTTLES DRAWN AEROBIC AND ANAEROBIC Performed at [facility], [address] [place], [place] [address]    Culture   Final    NO GROWTH 3 DAYS Performed at [facility], [address], [place], [place] [address]    Report Status PENDING  Incomplete  Resp Panel by RT-PCR (Flu A&B, Covid) Nasopharyngeal Swab     Status: None   Collection Time: 12/02/20  7:17 PM   Specimen: Nasopharyngeal Swab; Nasopharyngeal(NP) swabs in vial transport medium  Result Value Ref Range Status   SARS Coronavirus 2 by RT PCR NEGATIVE NEGATIVE Final    Comment: (NOTE) SARS-CoV-2 target nucleic acids are NOT DETECTED.  The SARS-CoV-2 RNA is generally detectable in upper respiratory specimens during the acute phase of infection. The lowest concentration of SARS-CoV-2 viral copies this assay can detect is 138 copies/mL. A negative result does not preclude SARS-Cov-2 infection and should not be used as the sole basis for treatment or other patient management decisions. A negative result may occur with  improper specimen collection/handling, submission of specimen other than nasopharyngeal swab, presence of viral mutation(s) within the areas targeted by this assay, and inadequate number of viral copies(<138 copies/mL). A negative result must be combined with clinical observations, patient history, and epidemiological information. The  expected result is Negative.  Fact Sheet for Patients:  BloggerCourse.com  Fact Sheet for Healthcare Providers:  SeriousBroker.it  This test is no t yet approved or cleared by the [place] FDA and  has been authorized for detection and/or diagnosis of SARS-CoV-2 by FDA under an Emergency Use  Authorization (EUA). This EUA will remain  in effect (meaning this test can be used) for the duration of the COVID-19 declaration under Section 564(b)(1) of the Act, 21 U.S.C.section 360bbb-3(b)(1), unless the authorization is terminated  or revoked sooner.       Influenza A by PCR NEGATIVE NEGATIVE Final   Influenza B by PCR NEGATIVE NEGATIVE Final    Comment: (NOTE) The Xpert Xpress SARS-CoV-2/FLU/RSV plus assay is intended as an aid in the diagnosis of influenza from Nasopharyngeal swab specimens and should not be used as a sole basis for treatment. Nasal washings and aspirates are unacceptable for Xpert Xpress SARS-CoV-2/FLU/RSV testing.  Fact Sheet for Patients: BloggerCourse.com  Fact Sheet for Healthcare Providers: SeriousBroker.it  This test is not yet approved or cleared by the [place] FDA and has been authorized for detection and/or diagnosis of SARS-CoV-2 by FDA under an Emergency Use Authorization (EUA). This EUA will remain in effect (meaning this test can be used) for the duration of the COVID-19 declaration under Section 564(b)(1) of the Act, 21 U.S.C. section 360bbb-3(b)(1), unless the authorization is terminated or revoked.  Performed at [facility], [address] [place], [place] [address]   Surgical PCR screen     Status: Abnormal   Collection Time: 12/02/20 10:39 PM   Specimen: Nasal Mucosa; Nasal Swab  Result Value Ref Range Status   MRSA, PCR NEGATIVE NEGATIVE Final   Staphylococcus aureus POSITIVE (A) NEGATIVE Final    Comment: RESULT CALLED TO, READ BACK BY AND VERIFIED WITH: EVANS,H. 12/03/20 :27 SEEL,M. (NOTE) The Xpert SA Assay (FDA approved for NASAL specimens in patients 36 years of age and older), is one component of a comprehensive surveillance program. It is not intended to diagnose infection nor to guide or monitor treatment. Performed at [facility], [address] [place], [place] [address]   Aerobic/Anaerobic Culture w Gram Stain (surgical/deep wound)     Status: Abnormal (Preliminary result)   Collection Time: 12/03/20 12:29 AM   Specimen: Abscess  Result Value Ref Range Status   Specimen Description   Final    ABSCESS SCROTAL Performed at [facility], [address], [place], [place] [address]    Special Requests   Final    NONE Performed at [facility], [address] [place], [place] [address]    Gram Stain   Final    ABUNDANT WBC PRESENT, PREDOMINANTLY PMN RARE GRAM NEGATIVE RODS Performed at [facility], [address], [place], [place] [address]    Culture (A)  Final    ESCHERICHIA COLI NO ANAEROBES ISOLATED; CULTURE IN PROGRESS FOR 5 DAYS    Report Status PENDING  Incomplete   Organism ID, Bacteria ESCHERICHIA COLI  Final      Susceptibility   Escherichia coli - MIC*    AMPICILLIN >=32 RESISTANT Resistant     CEFAZOLIN <=4 SENSITIVE Sensitive     CEFEPIME <=0.12 SENSITIVE Sensitive     CEFTAZIDIME <=1 SENSITIVE Sensitive     CEFTRIAXONE <=0.25 SENSITIVE Sensitive     CIPROFLOXACIN <=0.25 SENSITIVE Sensitive     GENTAMICIN <=1 SENSITIVE Sensitive     IMIPENEM <=  0.25 SENSITIVE Sensitive     TRIMETH/SULFA >=320 RESISTANT Resistant     AMPICILLIN/SULBACTAM 16 INTERMEDIATE Intermediate     PIP/TAZO <=4 SENSITIVE Sensitive     * ESCHERICHIA COLI     Time coordinating discharge: 45 minutes  SIGNED:   Coralie Keens, MD  [facility] 12/06/2020, 9:35 AM

## 2020-12-06 NOTE — Telephone Encounter (Addendum)
Spoke to pt and his son, pt was d/c from hospital today. INR was 1.7 today and pt already took 7.5mg  of warfarin. Rescheduled pt to come in on Tuesday to have INR checked. Instructed for pt to continue his normal dose of warfarin. Pt  has been having INR checked in [place], where he has been staying for the past 6 months. Pt' son stated pt has been on the same dose of warfarin 5mg  daily except for 2.5mg  on Mondays and his INR has been anywhere from 2.5-3. Pt has been having INR checked about every 30 days.

## 2020-12-07 LAB — CULTURE, BLOOD (ROUTINE X 2)
Culture: NO GROWTH
Culture: NO GROWTH
Special Requests: ADEQUATE
Special Requests: ADEQUATE

## 2020-12-10 LAB — AEROBIC/ANAEROBIC CULTURE W GRAM STAIN (SURGICAL/DEEP WOUND)

## 2020-12-11 ENCOUNTER — Ambulatory Visit: Payer: Medicare Other | Admitting: *Deleted

## 2020-12-11 ENCOUNTER — Other Ambulatory Visit: Payer: Self-pay

## 2020-12-11 ENCOUNTER — Other Ambulatory Visit: Payer: Self-pay | Admitting: Cardiovascular Disease

## 2020-12-11 DIAGNOSIS — I4891 Unspecified atrial fibrillation: Secondary | ICD-10-CM

## 2020-12-11 DIAGNOSIS — Z5181 Encounter for therapeutic drug level monitoring: Secondary | ICD-10-CM

## 2020-12-11 LAB — POCT INR: INR: 2.8 (ref 2.0–3.0)

## 2020-12-11 NOTE — Patient Instructions (Signed)
Description   Continue taking Warfarin 1 tablet every day except 1/2 tablet only on Mondays. Recheck INR in 6 weeks. Call us with any medication changes or concerns # [phone] [facility].

## 2021-01-22 ENCOUNTER — Ambulatory Visit: Payer: Medicare Other | Admitting: *Deleted

## 2021-01-22 ENCOUNTER — Other Ambulatory Visit: Payer: Self-pay

## 2021-01-22 DIAGNOSIS — Z5181 Encounter for therapeutic drug level monitoring: Secondary | ICD-10-CM | POA: Diagnosis not present

## 2021-01-22 DIAGNOSIS — I4891 Unspecified atrial fibrillation: Secondary | ICD-10-CM

## 2021-01-22 LAB — POCT INR: INR: 2.7 (ref 2.0–3.0)

## 2021-01-22 NOTE — Patient Instructions (Addendum)
Description   Continue taking Warfarin 1 tablet every day except 1/2 tablet only on Mondays. Recheck INR in 5 weeks with MD appt Call us with any medication changes or concerns # [phone] [facility].

## 2021-02-25 ENCOUNTER — Encounter: Payer: Self-pay | Admitting: Physician Assistant

## 2021-02-25 NOTE — Progress Notes (Addendum)
Cardiology Office Note    Date:  02/27/2021   ID:  Timothy Cantu, DOB 1950-08-26, MRN [id number]  PCP:  [name], MD  Cardiologist:  [name], MD  Electrophysiologist:  None   Chief Complaint: f/u NICM, atrial fibrillation  History of Present Illness:   Timothy Cantu is a 71 y.o. male with history of presumed NICM felt likely due to ETOH, nonobstructive CAD, chronic atrial fibrillation, LVH, mild MR, syncope, sciatica who is seen for follow-up. He has followed with Dr. [place] [facility] long term. Notes reference a remotely diagnosed cardiomyopathy back to 1999, EF previously 15% felt due to ETOH. EF was reported to have normalized after cessation of alcohol. Cardiac cath 2005 for syncope showed nonobstructive CAD (20% prox LAD, 40% RCA into PDA). Syncope was felt related to dehydration and PNA at the time. He has chronic atrial fibrillation and has been on Coumadin. He has declined DOAC. Last echo 05/2020 EF 55-60%, severe asymmetric hypertrophy of the basal and septal segments, indeterminate diastolic function, normal RV, severe LAE, moderate RAE, mild MR. This was obtained during an admission for unrelated issue (scrotal abscess).  He is seen for follow-up today and declines translator, requests his son who is present to translate. He is doing well from cardiac standpoint without any chest pain, SOB, palpitations, orthopnea, edema. Remains abstinent of ETOH. Taking all meds without issue. He is planning on going back to [place] for a year in mid February. While he is there his son states he does get his Coumadin level checked & managed when he is there. He has noticed a mild runny nose the last 2 days but no other major symptoms. No fevers, chills, sore throat or cough. His son plans to do a Covid test when they get home.   Labwork independently reviewed: 12/2020 A1c 6.5 11/2020 Hgb 14.4, plt 173, K 3.9, Cr 0.94 04/2020 trig 256, LDL 128   Cardiology Studies:   Studies reviewed are  outlined and summarized above. Reports included below if pertinent.   2D echo 05/2020  1. Left ventricular ejection fraction, by estimation, is 55 to 60%. The  left ventricle has normal function. The left ventricle has no regional  wall motion abnormalities. There is severe asymmetric left ventricular  hypertrophy of the basal and septal  segments. Left ventricular diastolic parameters are indeterminate.   2. Right ventricular systolic function is normal. The right ventricular  size is normal. There is normal pulmonary artery systolic pressure.   3. Left atrial size was severely dilated.   4. Right atrial size was moderately dilated.   5. The mitral valve is normal in structure. Mild mitral valve  regurgitation. No evidence of mitral stenosis.   6. The aortic valve is tricuspid. There is mild calcification of the  aortic valve. Aortic valve regurgitation is trivial. Mild aortic valve  sclerosis is present, with no evidence of aortic valve stenosis.   7. The inferior vena cava is normal in size with greater than 50%  respiratory variability, suggesting right atrial pressure of 3 mmHg.     Past Medical History:  Diagnosis Date   CAD (coronary artery disease)    nonobstructive by cath 2005   CAP (community acquired pneumonia)    Chronic atrial fibrillation (HCC)    Controlled type 2 diabetes mellitus with hyperglycemia (HCC) 12/02/2020   Dilated cardiomyopathy (HCC)    ETOH abuse    Hypertension    Long-term (current) use of anticoagulants    LVH (left ventricular hypertrophy)  Mild mitral regurgitation    NICM (nonischemic cardiomyopathy) (HCC)    Sciatica    Syncope 02/2003   pt was out for 5-6 minutes    Past Surgical History:  Procedure Laterality Date   CARDIAC CATHETERIZATION  02/28/2003   No critical coronary artery disease -- Severe left ventricular dysfunction with probable secondary mitral regurgitation -- Atrial fibrillation with controlled ventricular response    INCISION AND DRAINAGE ABSCESS N/A 05/20/2020   Procedure: INCISION AND DRAINAGE ABSCESS;  Surgeon: Marcine Matar, MD;  Location: [facility];  Service: Urology;  Laterality: N/A;   INCISION AND DRAINAGE ABSCESS N/A 12/02/2020   Procedure: INCISION AND DRAINAGE SCROTAL  ABSCESS;  Surgeon: Sebastian Ache, MD;  Location: [facility];  Service: Urology;  Laterality: N/A;   STOMACH SURGERY     Stomach Surgery? type:  in [place]    Current Medications: Current Meds  Medication Sig   acetaminophen (TYLENOL) 325 MG tablet Take 2 tablets (650 mg total) by mouth every 6 (six) hours as needed for mild pain or moderate pain (or Fever >/= 101).   carvedilol (COREG) 25 MG tablet Take 1 tablet (25 mg total) by mouth in the morning and at bedtime.   hydrochlorothiazide (HYDRODIURIL) 12.5 MG tablet Take 1 tablet (12.5 mg total) by mouth daily. (Patient taking differently: Take 12.5 mg by mouth in the morning.)   losartan (COZAAR) 50 MG tablet Take 1 tablet (50 mg total) by mouth daily. (Patient taking differently: Take 50 mg by mouth at bedtime.)   metFORMIN (GLUCOPHAGE) 500 MG tablet Take 500 mg by mouth at bedtime.   warfarin (COUMADIN) 5 MG tablet Take 1/2 a tablet to 1 tablet  by  mouth daily as directed by the coumadin clinic.   [DISCONTINUED] oxyCODONE (OXY IR/ROXICODONE) 5 MG immediate release tablet Take 1 tablet (5 mg total) by mouth every 6 (six) hours as needed for severe pain.    Allergies:   Patient has no known allergies.   Social History   Socioeconomic History   Marital status: Married    Spouse name: Not on file   Number of children: Not on file   Years of education: Not on file   Highest education level: Not on file  Occupational History   Not on file  Tobacco Use   Smoking status: Former    Packs/day: 20.00    Types: Cigarettes    Quit date: 03/04/1996    Years since quitting: 25.0   Smokeless tobacco: Never  Vaping Use   Vaping Use: Never used  Substance and Sexual Activity    Alcohol use: No   Drug use: No   Sexual activity: Not on file  Other Topics Concern   Not on file  Social History Narrative   Not on file   Social Determinants of Health   Financial Resource Strain: Not on file  Food Insecurity: Not on file  Transportation Needs: Not on file  Physical Activity: Not on file  Stress: Not on file  Social Connections: Not on file     Family History:  The patient's family history includes Heart disease in his mother. There is no history of Heart attack.  ROS:   Please see the history of present illness. .  All other systems are reviewed and otherwise negative.    EKG(s)/Additional Labs   EKG:  EKG is not ordered today but reviewed recent tracing 12/02/20 showing atrial fibrillation 112bpm, prior anterior infarct, no acute STT changes (during admission for scrotal abscess)  Recent Labs: 05/17/2020: NT-Pro BNP 465 12/03/2020: ALT 33 12/05/2020: BUN 19; Creatinine, Ser 0.94; Potassium 3.9; Sodium 139 12/06/2020: Hemoglobin 14.4; Platelets 173  Recent Lipid Panel    Component Value Date/Time   CHOL 202 (H) 09/26/2016 1012   TRIG 172 (H) 09/26/2016 1012   HDL 51 09/26/2016 1012   CHOLHDL 4.0 09/26/2016 1012   LDLCALC 117 (H) 09/26/2016 1012    PHYSICAL EXAM:    VS:  BP 124/76    Pulse 90    Ht 6\' 2"  (1.88 m)    Wt 264 lb 12.8 oz (120.1 kg)    SpO2 98%    BMI 34.00 kg/m   BMI: Body mass index is 34 kg/m.  GEN: Well nourished, well developed male in no acute distress HEENT: normocephalic, atraumatic Neck: no JVD, carotid bruits, or masses Cardiac: irregularly irregular, rate controlled; no murmurs, rubs, or gallops, no edema  Respiratory:  clear to auscultation bilaterally, normal work of breathing GI: soft, nontender, nondistended, + BS MS: no deformity or atrophy Skin: warm and dry, no rash Neuro:  Alert and Oriented x 3, Strength and sensation are intact, follows commands Psych: euthymic mood, full affect  Wt Readings from Last 3  Encounters:  02/27/21 264 lb 12.8 oz (120.1 kg)  12/02/20 267 lb 10.2 oz (121.4 kg)  05/20/20 264 lb 4.8 oz (119.9 kg)     ASSESSMENT & PLAN:   1. Nonischemic cardiomyopathy - remotely diagnosed with subsequent normalization of LV function by last several studies. Last echo 05/2020 reviewed. He appears euvolemic. Continue carvedilol, losartan, HCTZ at present doses. Remains abstinent of ETOH.  2. Nonobstructive CAD with HLD goal LDL <70 - asymptomatic. Not on ASA due to concomitant warfarin. Lipids 04/2020 not well controlled. He is not fasting today. When he returns for his next Coumadin check in February we will obtain updated CMET/fasting lipid profile. I do not see that he has been on a statin. Would consider statin initiation based on this result.  3. Chronic atrial fibrillation - rate is in the 80s-90s on exam which is consistent with his prior baseline rate. He was mildly tachycardic in the hospitalizations with his scrotal abscess but this is not surprising, likely driven by infection at the time. We discussed transition to DOAC but he plans to stick to Coumadin for now. If he desires I think he would be a candidate to change to Eliquis in the future. Will update CBC at his next Coumadin check in preparation for his departure to March.  4. Syncope - no recurrence. Felt due to dehydration/PNA at the time per Dr. [place] notes.  5. LVH and mild mitral regurgitation - echo 05/2020 showed severe asymmetric left ventricular hypertrophy of the basal and septal segment. He previously only had mild LVH in 2019. I will reach out to Dr. 2020 to inquire whether he feels this finding requires further evaluation. BP is well controlled. Anticipate his MR would require follow-up in 3-5 years if no other testing performed in the interim.    Disposition: F/u with Dr. [facility] in 1 year (pt will be out of the country for 1 year). He requests refills of his cardiac medications at today's visit which we will  provide. Per d/w CMA, Coumadin clinic will manage his refill on Coumadin (also has f/u with them before he leaves).   Medication Adjustments/Labs and Tests Ordered: Current medicines are reviewed at length with the patient today.  Concerns regarding medicines are outlined above. Medication changes, Labs and Tests  ordered today are summarized above and listed in the Patient Instructions accessible in Encounters.   Signed, Laurann Montana, PA-C  02/27/2021 3:43 PM    [facility] Phone: [phone]; Fax: [phone]

## 2021-02-27 ENCOUNTER — Other Ambulatory Visit: Payer: Self-pay

## 2021-02-27 ENCOUNTER — Encounter: Payer: Self-pay | Admitting: Physician Assistant

## 2021-02-27 ENCOUNTER — Ambulatory Visit: Payer: Medicare Other | Admitting: Physician Assistant

## 2021-02-27 ENCOUNTER — Ambulatory Visit (INDEPENDENT_AMBULATORY_CARE_PROVIDER_SITE_OTHER): Payer: Medicare Other | Admitting: *Deleted

## 2021-02-27 VITALS — BP 124/76 | HR 90 | Ht 74.0 in | Wt 264.8 lb

## 2021-02-27 DIAGNOSIS — I340 Nonrheumatic mitral (valve) insufficiency: Secondary | ICD-10-CM

## 2021-02-27 DIAGNOSIS — I2510 Atherosclerotic heart disease of native coronary artery without angina pectoris: Secondary | ICD-10-CM

## 2021-02-27 DIAGNOSIS — E785 Hyperlipidemia, unspecified: Secondary | ICD-10-CM

## 2021-02-27 DIAGNOSIS — I482 Chronic atrial fibrillation, unspecified: Secondary | ICD-10-CM

## 2021-02-27 DIAGNOSIS — I4891 Unspecified atrial fibrillation: Secondary | ICD-10-CM | POA: Diagnosis not present

## 2021-02-27 DIAGNOSIS — I517 Cardiomegaly: Secondary | ICD-10-CM

## 2021-02-27 DIAGNOSIS — Z5181 Encounter for therapeutic drug level monitoring: Secondary | ICD-10-CM

## 2021-02-27 DIAGNOSIS — Z87898 Personal history of other specified conditions: Secondary | ICD-10-CM

## 2021-02-27 DIAGNOSIS — I428 Other cardiomyopathies: Secondary | ICD-10-CM | POA: Diagnosis not present

## 2021-02-27 LAB — POCT INR: INR: 1.7 — AB (ref 2.0–3.0)

## 2021-02-27 MED ORDER — HYDROCHLOROTHIAZIDE 12.5 MG PO TABS: 12.5000 mg | ORAL_TABLET | Freq: Every morning | ORAL | 3 refills | Status: DC

## 2021-02-27 MED ORDER — CARVEDILOL 25 MG PO TABS: 25.0000 mg | ORAL_TABLET | Freq: Two times a day (BID) | ORAL | 0 refills | Status: DC

## 2021-02-27 MED ORDER — LOSARTAN POTASSIUM 50 MG PO TABS: 50.0000 mg | ORAL_TABLET | Freq: Every day | ORAL | 3 refills | Status: DC

## 2021-02-27 NOTE — Patient Instructions (Addendum)
Medication Instructions:  Your physician recommends that you continue on your current medications as directed. Please refer to the Current Medication list given to you today.  *If you need a refill on your cardiac medications before your next appointment, please call your pharmacy*   Lab Work: 03/27/21:  COME FOR COUMADIN AT 9:15, WE WILL GET THE LABS AT THE SAME TIME.  COME FASTING FOR:  CMET, LIPID, & CBC  If you have labs (blood work) drawn today and your tests are completely normal, you will receive your results only by: MyChart Message (if you have MyChart) OR A paper copy in the mail If you have any lab test that is abnormal or we need to change your treatment, we will call you to review the results.   Testing/Procedures: None ordered   Follow-Up: At [facility], you and your health needs are our priority.  As part of our continuing mission to provide you with exceptional heart care, we have created designated Provider Care Teams.  These Care Teams include your primary Cardiologist (physician) and Advanced Practice Providers (APPs -  Physician Assistants and Nurse Practitioners) who all work together to provide you with the care you need, when you need it.  We recommend signing up for the patient portal called "MyChart".  Sign up information is provided on this After Visit Summary.  MyChart is used to connect with patients for Virtual Visits (Telemedicine).  Patients are able to view lab/test results, encounter notes, upcoming appointments, etc.  Non-urgent messages can be sent to your provider as well.   To learn more about what you can do with MyChart, go to [email or web address].    Your next appointment:   12 month(s)  The format for your next appointment:   In Person  Provider:   [name], MD  or Ronie Spies, PA-C         Other Instructions

## 2021-02-27 NOTE — Patient Instructions (Signed)
Description   Today take 1.5 tablets then continue taking Warfarin 1 tablet every day except 1/2 tablet only on Mondays. Recheck INR in 4 weeks.  Call us with any medication changes or concerns # [phone] Coumadin Clinic.

## 2021-03-08 ENCOUNTER — Telehealth: Payer: Self-pay | Admitting: Physician Assistant

## 2021-03-08 NOTE — Telephone Encounter (Signed)
Following recent OV I reached out to Dr. [facility] Emms to review echo from 05/2020 (while admitted for unrelated issue) which showed severe asymmetric left ventricular hypertrophy of the basal and septal segment. He previously only had mild LVH in 2019. Please let pt know that per d/w Dr. [place] [facility], since he was doing well clinically without any heart symptoms, no additional testing needed at this time. Continue plan as discussed.

## 2021-03-08 NOTE — Telephone Encounter (Signed)
Call placed to pt, he asked me to call his son, [name], due to limiting speaking / understanding English. Call placed to [facility], left a message for him to call.

## 2021-03-08 NOTE — Telephone Encounter (Signed)
Pt's son, Johnella Moloney, returned my call.  He has been made aware that Melina Copa, PA-C reached out to Dr. Johnsie Cancel to review echo from 05/2020 (while admitted for unrelated issue) which showed severe asymmetric left ventricular hypertrophy of the basal and septal segment. He previously only had mild LVH in 2019. Please let pt know that per d/w Dr. Johnsie Cancel, since he was doing well clinically without any heart symptoms, no additional testing needed at this time. Continue plan as discussed  He verbalized understanding and thanked [place] for following up.

## 2021-03-09 ENCOUNTER — Other Ambulatory Visit: Payer: Self-pay | Admitting: Physician Assistant

## 2021-03-22 DIAGNOSIS — N509 Disorder of male genital organs, unspecified: Secondary | ICD-10-CM | POA: Diagnosis not present

## 2021-03-23 ENCOUNTER — Other Ambulatory Visit: Payer: Self-pay | Admitting: Cardiovascular Disease

## 2021-03-27 ENCOUNTER — Other Ambulatory Visit: Payer: Medicare Other | Admitting: *Deleted

## 2021-03-27 ENCOUNTER — Other Ambulatory Visit: Payer: Self-pay

## 2021-03-27 ENCOUNTER — Ambulatory Visit: Payer: Medicare Other

## 2021-03-27 DIAGNOSIS — I4891 Unspecified atrial fibrillation: Secondary | ICD-10-CM

## 2021-03-27 DIAGNOSIS — I482 Chronic atrial fibrillation, unspecified: Secondary | ICD-10-CM | POA: Diagnosis not present

## 2021-03-27 DIAGNOSIS — I517 Cardiomegaly: Secondary | ICD-10-CM

## 2021-03-27 DIAGNOSIS — I2510 Atherosclerotic heart disease of native coronary artery without angina pectoris: Secondary | ICD-10-CM

## 2021-03-27 DIAGNOSIS — E785 Hyperlipidemia, unspecified: Secondary | ICD-10-CM | POA: Diagnosis not present

## 2021-03-27 DIAGNOSIS — I340 Nonrheumatic mitral (valve) insufficiency: Secondary | ICD-10-CM

## 2021-03-27 DIAGNOSIS — Z5181 Encounter for therapeutic drug level monitoring: Secondary | ICD-10-CM

## 2021-03-27 DIAGNOSIS — Z87898 Personal history of other specified conditions: Secondary | ICD-10-CM

## 2021-03-27 DIAGNOSIS — I428 Other cardiomyopathies: Secondary | ICD-10-CM

## 2021-03-27 LAB — CBC
Hematocrit: 48.4 % (ref 37.5–51.0)
Hemoglobin: 16.4 g/dL (ref 13.0–17.7)
MCH: 30.7 pg (ref 26.6–33.0)
MCHC: 33.9 g/dL (ref 31.5–35.7)
MCV: 91 fL (ref 79–97)
Platelets: 147 10*3/uL — ABNORMAL LOW (ref 150–450)
RBC: 5.34 x10E6/uL (ref 4.14–5.80)
RDW: 13 % (ref 11.6–15.4)
WBC: 8.7 10*3/uL (ref 3.4–10.8)

## 2021-03-27 LAB — POCT INR: INR: 2 (ref 2.0–3.0)

## 2021-03-27 LAB — COMPREHENSIVE METABOLIC PANEL
ALT: 48 IU/L — ABNORMAL HIGH (ref 0–44)
AST: 24 IU/L (ref 0–40)
Albumin/Globulin Ratio: 1.8 (ref 1.2–2.2)
Albumin: 4.5 g/dL (ref 3.8–4.8)
Alkaline Phosphatase: 87 IU/L (ref 44–121)
BUN/Creatinine Ratio: 14 (ref 10–24)
BUN: 15 mg/dL (ref 8–27)
Bilirubin Total: 0.6 mg/dL (ref 0.0–1.2)
CO2: 21 mmol/L (ref 20–29)
Calcium: 9.2 mg/dL (ref 8.6–10.2)
Chloride: 103 mmol/L (ref 96–106)
Creatinine, Ser: 1.09 mg/dL (ref 0.76–1.27)
Globulin, Total: 2.5 g/dL (ref 1.5–4.5)
Glucose: 139 mg/dL — ABNORMAL HIGH (ref 70–99)
Potassium: 4.3 mmol/L (ref 3.5–5.2)
Sodium: 139 mmol/L (ref 134–144)
Total Protein: 7 g/dL (ref 6.0–8.5)
eGFR: 73 mL/min/{1.73_m2} (ref >59)

## 2021-03-27 LAB — LIPID PANEL
Chol/HDL Ratio: 6.2 ratio — ABNORMAL HIGH (ref 0.0–5.0)
Cholesterol, Total: 241 mg/dL — ABNORMAL HIGH (ref 100–199)
HDL: 39 mg/dL — ABNORMAL LOW (ref >39)
LDL Chol Calc (NIH): 139 mg/dL — ABNORMAL HIGH (ref 0–99)
Triglycerides: 348 mg/dL — ABNORMAL HIGH (ref 0–149)
VLDL Cholesterol Cal: 63 mg/dL — ABNORMAL HIGH (ref 5–40)

## 2021-03-27 MED ORDER — WARFARIN SODIUM 5 MG PO TABS: ORAL_TABLET | ORAL | 0 refills | Status: DC

## 2021-03-27 NOTE — Patient Instructions (Signed)
Description   Today take 1.5 tablets then continue taking Warfarin 1 tablet every day except 1/2 tablet only on Mondays. Recheck INR in 5 weeks. Call us with any medication changes or concerns # [phone] Coumadin Clinic.   Pt moving [place], will be established with healthcare provider at new location.

## 2021-03-27 NOTE — Telephone Encounter (Signed)
Prescription refill request received for warfarin [place]: 02/27/21 Timothy Cantu)  Next INR check: 05/01/21 Warfarin tablet strength: 5mg   At anticoagulation visit today, pt stated he was leaving to live in for 6 months and possibly permanently moving to [place]. Per note on 11/23/2018, pt has been approved by Dr 01/23/2019 to receive a 6 month supply while he is gone; however, I made pt aware that he will need to have INR checked regularly while he is gone and make Anticoagulation Clinic aware if he moves to [place] permanently. Pt and his wife verbalized understanding.

## 2021-04-03 ENCOUNTER — Telehealth: Payer: Self-pay | Admitting: *Deleted

## 2021-04-03 MED ORDER — ROSUVASTATIN CALCIUM 20 MG PO TABS: 20.0000 mg | ORAL_TABLET | Freq: Every day | ORAL | 3 refills | Status: DC

## 2021-04-03 NOTE — Telephone Encounter (Signed)
-----   Message from Charlie Pitter, PA-C sent at 03/29/2021  6:30 AM EST ----- Please let pt know cholesterol is very high. Other mild abnormalities as outlined - mild decrease in platelet count, one liver function number up slightly. His blood sugar is also elevated. I see he had A1c in 12/2020 at outside office of 6.5 confirming that he is diabetic. Needs to follow up with PCP for these things.   At this juncture would suggest initiation of rosuvastatin 20mg  daily - tricky part is the need for followup labs since he is going to [place] for a year. I would suggest he establish care with a PCP there and discuss recheck of fasting lipid panel/liver function in about 6-8 weeks. If he declines and wishes to hold off, he should notify us on his return so we can revisit starting.

## 2021-04-05 ENCOUNTER — Other Ambulatory Visit: Payer: Self-pay | Admitting: Cardiovascular Disease

## 2021-04-05 MED ORDER — ROSUVASTATIN CALCIUM 20 MG PO TABS: 20.0000 mg | ORAL_TABLET | Freq: Every day | ORAL | 0 refills | Status: DC

## 2021-04-05 MED ORDER — CARVEDILOL 25 MG PO TABS: ORAL_TABLET | ORAL | 0 refills | Status: DC

## 2021-04-05 MED ORDER — WARFARIN SODIUM 5 MG PO TABS: ORAL_TABLET | ORAL | 0 refills | Status: DC

## 2021-04-05 MED ORDER — HYDROCHLOROTHIAZIDE 12.5 MG PO TABS: 12.5000 mg | ORAL_TABLET | Freq: Every morning | ORAL | 0 refills | Status: DC

## 2021-04-05 MED ORDER — LOSARTAN POTASSIUM 50 MG PO TABS: 50.0000 mg | ORAL_TABLET | Freq: Every day | ORAL | 0 refills | Status: DC

## 2021-04-05 NOTE — Telephone Encounter (Signed)
Called pt and left message informing him that his medications were sent to his preferred pharmacy, except for his medication metformin and that he needed to contact his PCP for a refill and if he had any other problems, questions or concerns to give our office a call back. Pt is out of town and need a 7 day supply of warfarin sent to pharmacy out of town. Please address

## 2021-04-05 NOTE — Telephone Encounter (Signed)
°*  STAT* If patient is at the pharmacy, call can be transferred to refill team.   1. Which medications need to be refilled? (please list name of each medication and dose if known)  carvedilol (COREG) 25 MG tablet  warfarin (COUMADIN) 5 MG tablet  hydrochlorothiazide (HYDRODIURIL) 12.5 MG tablet  losartan (COZAAR) 50 MG tablet  metFORMIN (GLUCOPHAGE) 500 MG tablet  rosuvastatin (CRESTOR) 20 MG tablet  2. Which pharmacy/location (including street and city if local pharmacy) is medication to be sent to?  [place] [place] P: [phone] F: [phone]  3. Do they need a 30 day or 90 day supply?   pt is at an airport back are stuck in [place] and all medication is in this bag.. pt is needing a 7-day supply of his meds until he can get to his belongings.

## 2021-05-01 ENCOUNTER — Telehealth: Payer: Self-pay | Admitting: *Deleted

## 2021-05-01 NOTE — Telephone Encounter (Signed)
Called pt since he missed his Anticoagulation Clinic Appt today. Spoke with one son that states the pt is not in town but he asked if I would call the other son for more details. Called the other son, [name], and he stated his dad has moved to [place] and will be back in a year. He stated he has someone monitoring his warfarin and levels while there like he usually does when he is gone for 6  months. Advised that once he returns to the [place] and when he follows up with Dr. [facility] to let us know and he verbalized understanding. Will resolve anticoagulation episode at this time since it will be a year before returning to the [place].  ?

## 2021-10-10 ENCOUNTER — Other Ambulatory Visit: Payer: Self-pay | Admitting: Cardiovascular Disease

## 2021-10-10 NOTE — Telephone Encounter (Signed)
Per telephone note on 05/01/21,  pt has moved to [place].

## 2021-12-17 ENCOUNTER — Other Ambulatory Visit: Payer: Self-pay | Admitting: Physician Assistant

## 2021-12-23 ENCOUNTER — Other Ambulatory Visit: Payer: Self-pay | Admitting: *Deleted

## 2021-12-23 MED ORDER — CARVEDILOL 25 MG PO TABS: ORAL_TABLET | ORAL | 0 refills | Status: DC

## 2022-08-16 IMAGING — CT CT ABD-PELV W/ CM
2 of 6 series · 15 of 46 positions shown, 17 images · IV contrast (Omnipaque)
Comparison: CT May 20, 2020

CLINICAL DATA: History of scrotal abscess, now with increased
swelling and drainage.

EXAM:
CT ABDOMEN AND PELVIS WITH CONTRAST
TECHNIQUE: Multidetector CT imaging of the abdomen and pelvis was performed
using the standard protocol following bolus administration of
intravenous contrast.
CONTRAST:  100mL OMNIPAQUE IOHEXOL 300 MG/ML  SOLN

[Series 5: coronal st · coronal · 0.99mm/px · 3 of 101 slices shown]
[im 34/101  soft-tissue]
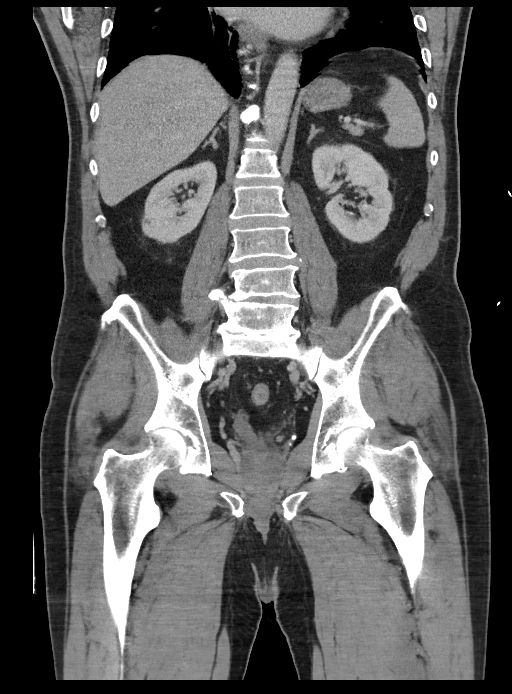
[im 45/101  soft-tissue]
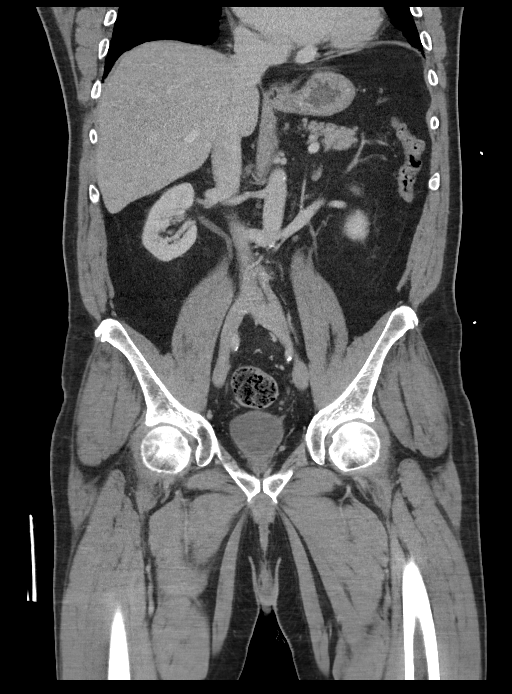
[im 56/101  soft-tissue]
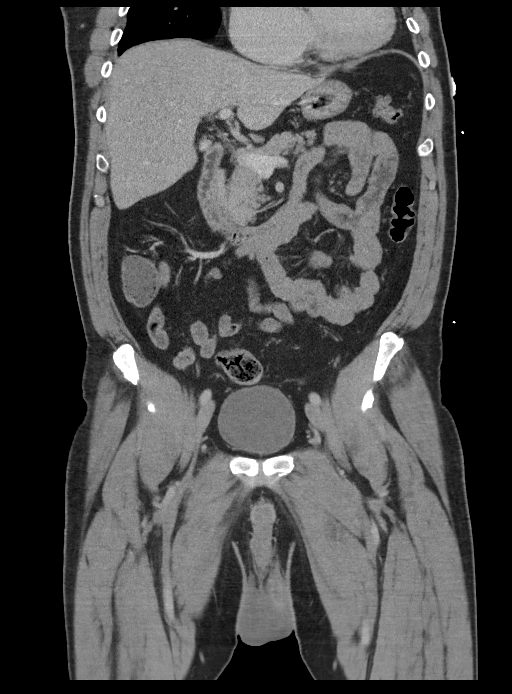

[Series 8: axial st · axial · 0.91mm/px · z∈[+678,+1238]mm · 12 of 128 slices shown, 14 images]
[im 8/128  soft-tissue]
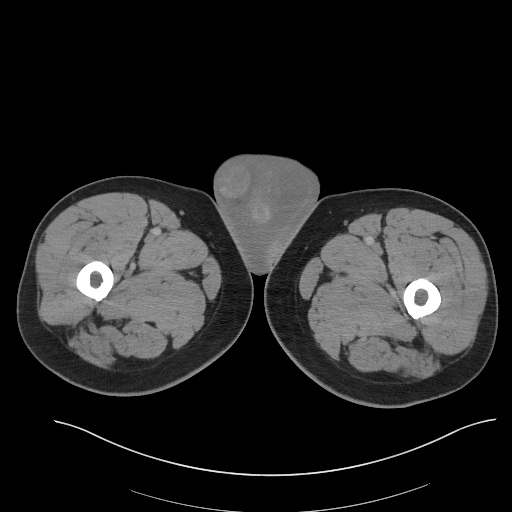
[im 8/128  bone]
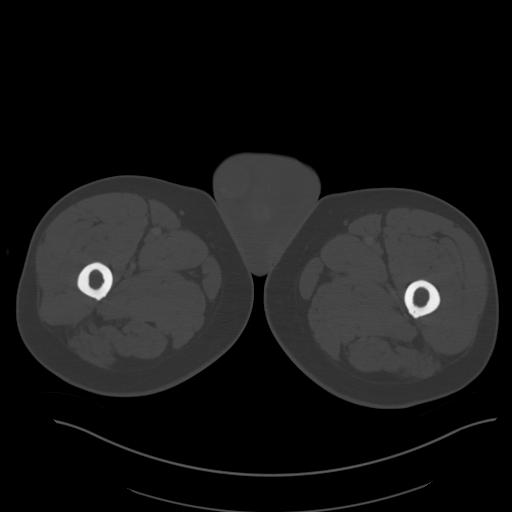
[im 23/128  soft-tissue]
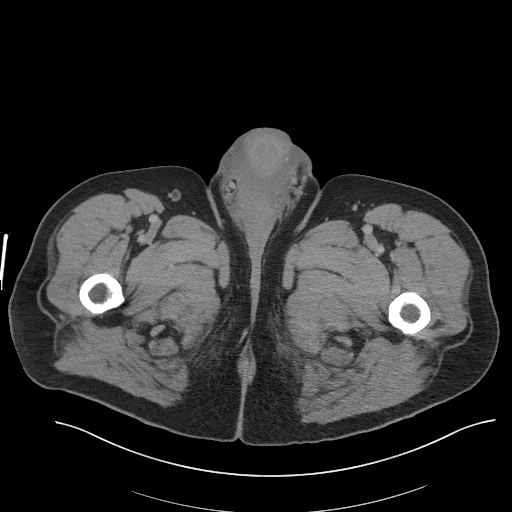
[im 30/128  soft-tissue]
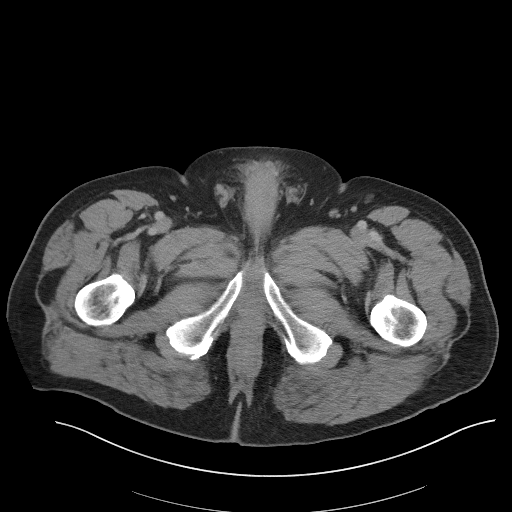
[im 38/128  soft-tissue]
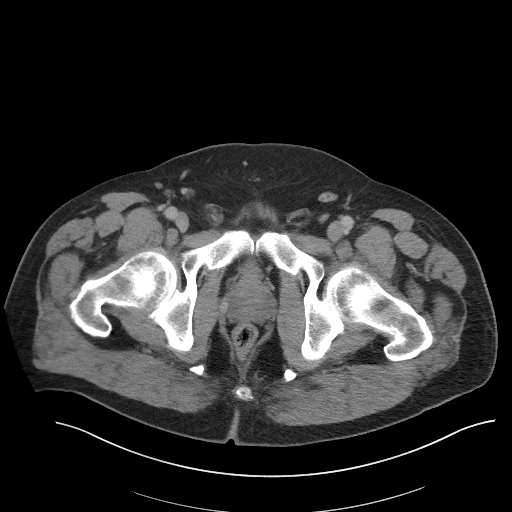
[im 53/128  soft-tissue]
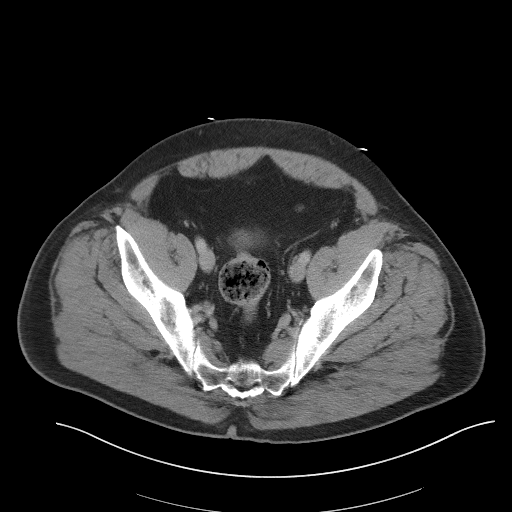
[im 60/128  soft-tissue]
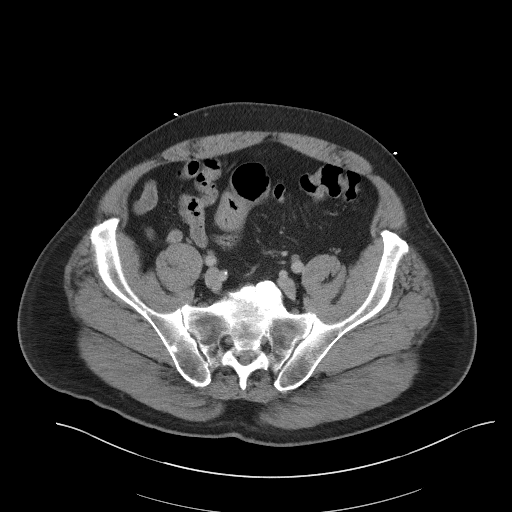
[im 68/128  soft-tissue]
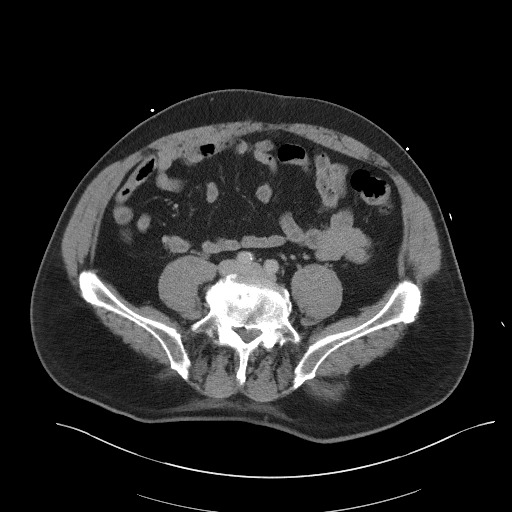
[im 83/128  soft-tissue]
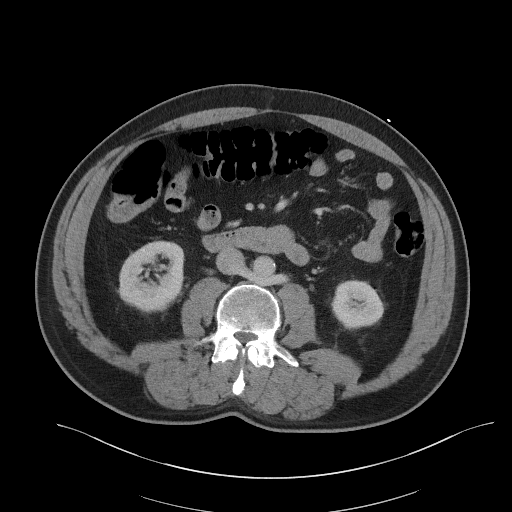
[im 90/128  soft-tissue]
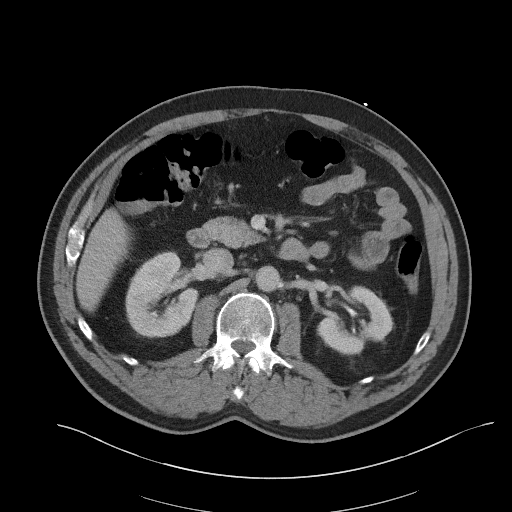
[im 90/128  bone]
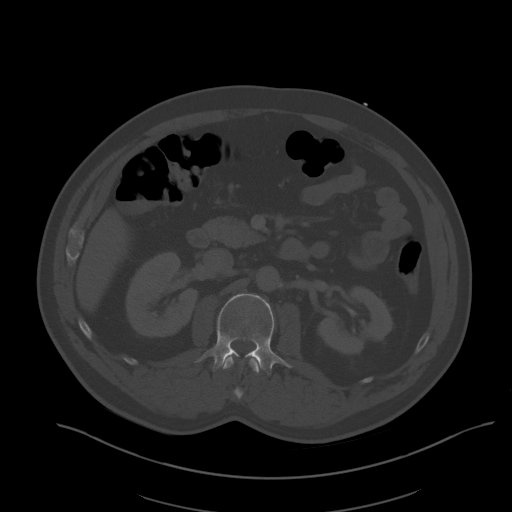
[im 98/128  soft-tissue]
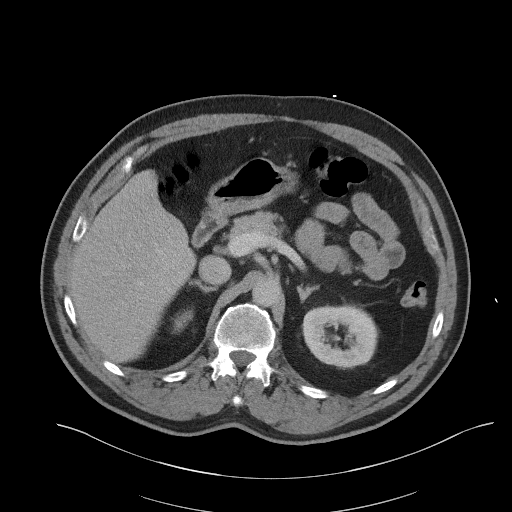
[im 113/128  soft-tissue]
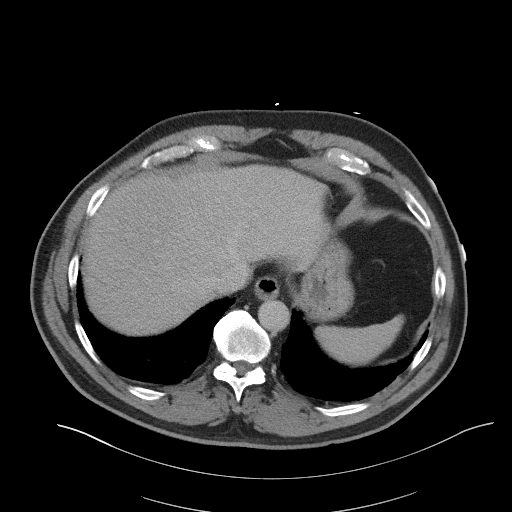
[im 120/128  soft-tissue]
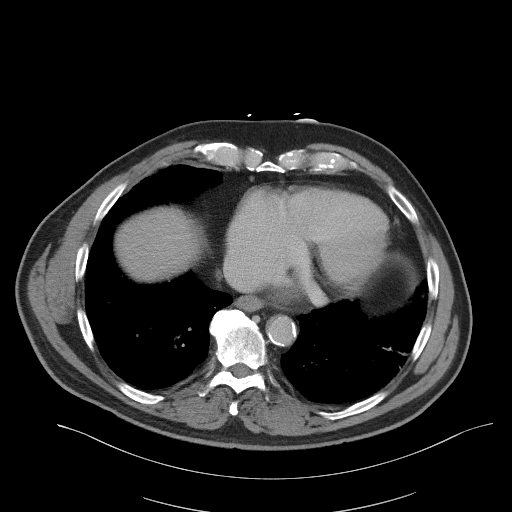

[15 of 46 positions shown; findings below may reference images not displayed]

FINDINGS: Lower chest: Bibasilar atelectasis versus scarring.

Hepatobiliary: No suspicious hepatic lesion. Cholelithiasis without
findings of acute cholecystitis. No biliary ductal dilation.

Pancreas: No pancreatic ductal dilation or evidence of acute
inflammation.

Spleen: Within normal limits.

Adrenals/Urinary Tract: Adrenal glands are unremarkable. Kidneys are
normal, without renal calculi, solid enhancing lesion, or
hydronephrosis. Bladder is unremarkable for degree of distension.

Stomach/Bowel: Small hiatal hernia otherwise the stomach is
unremarkable for degree of distension. No pathologic dilation of
small or large bowel. The appendix and terminal ileum appear normal.
No evidence of acute bowel inflammation.

Vascular/Lymphatic: Aortic and branch vessel atherosclerosis without
aneurysmal dilation. No pathologically enlarged abdominal or pelvic
lymph nodes.

Reproductive: There is a rim enhancing gas and fluid collection in
the scrotum posterosuperior to the testicle in posterior to the
penile shaft common same location as the previously drained scrotal
abscess now measuring 5.8 x 3.7 cm on image 111/8 previously 5.6 x
6.9 cm. There is a new thick walled fluid collection posterior to
this along the left lateral aspect of the scrotum which measures
x 1.4 cm on image 116/8, this appears to track to the cutaneous skin
surface along the left posterior hemiscrotum on image 121/8.
Bilateral hydroceles. Prostate glands unremarkable.

Other: No abdominopelvic free fluid.

Musculoskeletal: Multilevel degenerative changes spine. No acute
osseous abnormality.
IMPRESSION: 1. Scrotal abscess similar location but decreased in size in
comparison to the previously drained scrotal abscess now measuring
5.8 x 3.7 cm.
2. New thick walled fluid collection posterior to this along the
left lateral aspect of the scrotum which measures 4.4 x 1.4 cm, this
appears to track to the cutaneous skin surface along the left
posterior hemiscrotum. Findings also likely reflect a scrotal
abscess.
3. Bilateral hydroceles.
4. Cholelithiasis without acute cholecystitis.
5. Aortic Atherosclerosis (OXU22-CTE.E).

## 2023-07-30 ENCOUNTER — Other Ambulatory Visit: Payer: Self-pay

## 2023-07-30 DIAGNOSIS — I4891 Unspecified atrial fibrillation: Secondary | ICD-10-CM

## 2023-07-30 DIAGNOSIS — Z5181 Encounter for therapeutic drug level monitoring: Secondary | ICD-10-CM

## 2023-07-30 NOTE — Progress Notes (Signed)
 Cardiology Office Note    Date:  07/31/2023  ID:  Timothy Cantu, DOB 16-Oct-1950, MRN [id number] PCP:  Jacqulyne Maxim, MD  Cardiologist:  Janelle Mediate, MD  Electrophysiologist:  None   Chief Complaint: overdue follow-up (has been living in [place]  History of Present Illness: .    Timothy Cantu is a 73 y.o. 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 He has followed with Dr. Stann Earnest long term. Notes reference a remotely diagnosed cardiomyopathy back to 1999, EF previously 15% felt due to ETOH. EF was reported to have normalized after cessation of alcohol. Cardiac cath 2005 for syncope showed nonobstructive CAD (20% prox LAD, 40% RCA into PDA). Syncope was felt related to dehydration and PNA at the time. He has chronic atrial fibrillation and has been on Coumadin . He has declined DOAC. Last echo 05/2020 EF 55-60%, severe asymmetric hypertrophy of the basal and septal segments, indeterminate diastolic function, normal RV, severe LAE, moderate RAE, mild MR. This was obtained during an admission for unrelated issue (scrotal abscess). He periodically moves to [place] for long periods of time where he has arrangements to check his INR while there.   He returns today with his son Timothy Cantu for whom he prefers translate for him, declined interpreter service. He is doing very well. No CP, SOB, palpitations, edema, bleeding, or any other concerns. A large portion of time was spent researching the [place] equivalents of his medication list from [place] He is now on a calcium  channel blocker not available in the [place] , lercanidipine. He was quite ill with gallbladder disease in March and they had to scale back on his Yanida and Corenlin as blood pressure was on the low side. Since making this change 6 weeks ago, his blood pressure has normalized. So at present  time he is taking the present medications and has plenty of supply to get him through until he returns to [place] in July:  Roxera (rosuvastatin ) 20mg  daily Farin (warfarin) 5mg  as directed by Coumadin  clinic Karvol (carvedilol ) 25mg  BID Glucophage 1000mg  daily Yanida Plus (valsartan/hydrochlorothiazide ) 80mg /12.5mg  taking 1/2 tablet daily  Cornelin (lercanidipine) 10mg  taking 1/2 tablet daily Tylenol  PRN   Labwork independently reviewed: CareEverywhere today - Hgb 14.0, plt 184,  K 4.3, Cr 1.04, LFTs ok, glu 148, LDL 58, trig 117 03/2021 LDL 139, trig 348, Hgb 16.4, plt 147, K 4.3, Cr 1.09, LFTs ok except ALT 48 2022 A1c 6.5  ROS: .    Please see the history of present illness.  All other systems are reviewed and otherwise negative.  Studies Reviewed: Aaron Aas    EKG:  EKG is ordered today, personally reviewed, demonstrating:  EKG Interpretation Date/Time:  Friday July 31 2023 15:22:30 EDT Ventricular Rate:  92 PR Interval:    QRS Duration:  84 QT Interval:  362 QTC Calculation: 447 R Axis:   96  Text Interpretation: Atrial fibrillation Rightward axis Prior anterior infarct with poor R wave progression pattern Nonspecific TW changes Confirmed by Zakery Normington [id number]) on 07/31/2023 4:07:36 PM    CV Studies: Cardiac studies reviewed are outlined and summarized above. Otherwise please see EMR for full report.   Current Reported Medications:.    Current Meds  Medication Sig   acetaminophen  (TYLENOL ) 325 MG tablet Take 2 tablets (650 mg total) by mouth every 6 (six) hours as needed for mild pain or moderate pain (or  Fever >/= 101).   carvedilol  (COREG ) 25 MG tablet Take 1 tablet (25 mg total) by mouth 2 (two) times daily with a meal. Karvol ([facility] prescription)   metFORMIN (GLUCOPHAGE) 500 MG tablet Take 1,000 mg by mouth daily.   PRESCRIPTION MEDICATION Take 0.5 tablets by mouth daily. Cornelin/lercandipine ([facility] prescription not available in [place]  - calcium  channel blocker)    rosuvastatin  (CRESTOR ) 20 MG tablet Take 1 tablet (20 mg total) by mouth daily. Roxera ([facility] prescription)   telmisartan-hydrochlorothiazide  (MICARDIS HCT) 80-12.5 MG tablet Take 0.5 tablets by mouth daily. Milon Aloe Plus ([facility] prescription)   [DISCONTINUED] carvedilol  (COREG ) 25 MG tablet TAKE 1 TABLET BY MOUTH IN THE MORNING AND AT BEDTIME    Physical Exam:    VS:  BP 126/74   Pulse 92   Ht 6' 2 (1.88 m)   Wt 246 lb (111.6 kg)   SpO2 95%   BMI 31.58 kg/m    Wt Readings from Last 3 Encounters:  07/31/23 246 lb (111.6 kg)  02/27/21 264 lb 12.8 oz (120.1 kg)  12/02/20 267 lb 10.2 oz (121.4 kg)    GEN: Well nourished, well developed in no acute distress NECK: No JVD; No carotid bruits CARDIAC: irr irregular, no murmurs, rubs, gallops RESPIRATORY:  Clear to auscultation without rales, wheezing or rhonchi  ABDOMEN: Soft, non-tender, non-distended EXTREMITIES:  No edema; No acute deformity   Asessement and Plan:.    1. NICM, HTN - clinically stable. I have updated his medicine list to reflect the regimen he is taking from [place] Since he has been stable on this regimen and will be returning to [place] for at least a year once again in a few weeks, I do not think we should divert from the current regimen in case there is variability in the strengths and formulations. He reports he has plenty supply of medications to last him until he returns there in July. If he has any issues with recurrent low blood pressure, would likely discontinue the Cornelin (lercanidipine). They will notify for any concerns. Labs from PCP reviewed today and stable.  2. Nonobstructive CAD, HLD - asymptomatic, not on ASA due to concomitant Eliquis. Lipid panel reviewed from PCP today and much improved on [facility] equivalent of rosuvastatin , no change today.  3. Chronic atrial fibrillation - remains rate controlled on the [facility] carvedilol  25mg  BID. He also had f/u INR with Coumadin  clinic today who plan to follow  him once more before he returns to [place] We discussed potentially changing to DOAC but son is unsure whether Eliquis or Xarelto are available in [place] Therefore they prefer to keep with the warfarin (Farin) for now, and they will plan to discuss transitioning with his care team over in [place]  4. LVH with mild MR - as per prior discussion with MD, given that he has been asymptomatic, no further testing planned for LVH, suspected due to HTN. For his MR, would consider repeat echo in 2026-2027 upon return to the [place]     Disposition: F/u with myself or Dr. Stann Earnest upon return from [place] (likely >1 year per son).  Signed, Kayna Suppa N Clovis Mankins, PA-C

## 2023-07-31 ENCOUNTER — Encounter: Payer: Self-pay | Admitting: Physician Assistant

## 2023-07-31 ENCOUNTER — Ambulatory Visit (INDEPENDENT_AMBULATORY_CARE_PROVIDER_SITE_OTHER)

## 2023-07-31 ENCOUNTER — Ambulatory Visit: Attending: Physician Assistant | Admitting: Physician Assistant

## 2023-07-31 VITALS — BP 126/74 | HR 92 | Ht 74.0 in | Wt 246.0 lb

## 2023-07-31 DIAGNOSIS — I428 Other cardiomyopathies: Secondary | ICD-10-CM | POA: Diagnosis not present

## 2023-07-31 DIAGNOSIS — Z0000 Encounter for general adult medical examination without abnormal findings: Secondary | ICD-10-CM | POA: Diagnosis not present

## 2023-07-31 DIAGNOSIS — E119 Type 2 diabetes mellitus without complications: Secondary | ICD-10-CM | POA: Diagnosis not present

## 2023-07-31 DIAGNOSIS — I340 Nonrheumatic mitral (valve) insufficiency: Secondary | ICD-10-CM

## 2023-07-31 DIAGNOSIS — I10 Essential (primary) hypertension: Secondary | ICD-10-CM | POA: Diagnosis not present

## 2023-07-31 DIAGNOSIS — I517 Cardiomegaly: Secondary | ICD-10-CM

## 2023-07-31 DIAGNOSIS — I5032 Chronic diastolic (congestive) heart failure: Secondary | ICD-10-CM | POA: Diagnosis not present

## 2023-07-31 DIAGNOSIS — E785 Hyperlipidemia, unspecified: Secondary | ICD-10-CM | POA: Diagnosis not present

## 2023-07-31 DIAGNOSIS — I2510 Atherosclerotic heart disease of native coronary artery without angina pectoris: Secondary | ICD-10-CM | POA: Diagnosis not present

## 2023-07-31 DIAGNOSIS — I482 Chronic atrial fibrillation, unspecified: Secondary | ICD-10-CM | POA: Diagnosis not present

## 2023-07-31 DIAGNOSIS — Z5181 Encounter for therapeutic drug level monitoring: Secondary | ICD-10-CM

## 2023-07-31 LAB — POCT INR: INR: 1.8 — AB (ref 2.0–3.0)

## 2023-07-31 MED ORDER — CARVEDILOL 25 MG PO TABS: 25.0000 mg | ORAL_TABLET | Freq: Two times a day (BID) | ORAL | Status: AC

## 2023-07-31 MED ORDER — WARFARIN SODIUM 5 MG PO TABS: ORAL_TABLET | ORAL | Status: AC

## 2023-07-31 MED ORDER — ROSUVASTATIN CALCIUM 20 MG PO TABS: 20.0000 mg | ORAL_TABLET | Freq: Every day | ORAL | Status: AC

## 2023-07-31 NOTE — Patient Instructions (Signed)
 Today take 1.5 tablets then continue taking Warfarin 1 tablet every day except 1/2 tablet only on Mondays and Fridays. Recheck INR in 2 weeks. Call us  with any medication changes or concerns # [phone] Coumadin  Clinic.

## 2023-07-31 NOTE — Patient Instructions (Signed)
 Medication Instructions:  Your physician recommends that you continue on your current medications as directed. Please refer to the Current Medication list given to you today.  *If you need a refill on your cardiac medications before your next appointment, please call your pharmacy*  Follow-Up: At [facility], you and your health needs are our priority.  As part of our continuing mission to provide you with exceptional heart care, our providers are all part of one team.  This team includes your primary Cardiologist (physician) and Advanced Practice Providers or APPs (Physician Assistants and Nurse Practitioners) who all work together to provide you with the care you need, when you need it.  Your next appointment:   As needed  Provider:   Janelle Mediate, MD  We recommend signing up for the patient portal called MyChart.  Sign up information is provided on this After Visit Summary.  MyChart is used to connect with patients for Virtual Visits (Telemedicine).  Patients are able to view lab/test results, encounter notes, upcoming appointments, etc.  Non-urgent messages can be sent to your provider as well.   To learn more about what you can do with MyChart, go to [email or web address].   Other Instructions Please call our office with lab results from primary care doctor's office. Our number is [phone].

## 2023-08-14 ENCOUNTER — Ambulatory Visit: Attending: Cardiovascular Disease

## 2023-08-14 DIAGNOSIS — I4891 Unspecified atrial fibrillation: Secondary | ICD-10-CM | POA: Diagnosis not present

## 2023-08-14 DIAGNOSIS — Z5181 Encounter for therapeutic drug level monitoring: Secondary | ICD-10-CM

## 2023-08-14 LAB — POCT INR: INR: 1.6 — AB (ref 2.0–3.0)

## 2023-08-14 NOTE — Patient Instructions (Signed)
 Today take another 0.5 tablet then Increase to 1 tablet daily.  Recheck INR in 3 weeks. Call us  with any medication changes or concerns # [phone] Coumadin  Clinic.

## 2023-08-14 NOTE — Progress Notes (Signed)
Please see anticoagulation encounter.

## 2023-08-31 ENCOUNTER — Ambulatory Visit: Attending: Cardiovascular Disease

## 2023-08-31 DIAGNOSIS — Z5181 Encounter for therapeutic drug level monitoring: Secondary | ICD-10-CM

## 2023-08-31 DIAGNOSIS — I4891 Unspecified atrial fibrillation: Secondary | ICD-10-CM | POA: Diagnosis not present

## 2023-08-31 LAB — POCT INR: INR: 2.3 (ref 2.0–3.0)

## 2023-08-31 NOTE — Patient Instructions (Signed)
 Continue 1 tablet daily.  Recheck INR in 3 weeks. Call us  with any medication changes or concerns # [phone] Coumadin  Clinic.

## 2023-08-31 NOTE — Progress Notes (Signed)
Please see anticoagulation encounter.
